# Patient Record
Sex: Female | Born: 1940 | Race: White | Hispanic: No | Smoking: Never smoker
Health system: Southern US, Community
[De-identification: ages and names within clinical notes are randomized; demographics above are authoritative.]

## PROBLEM LIST (undated history)

## (undated) DIAGNOSIS — R0789 Other chest pain: Secondary | ICD-10-CM

## (undated) DIAGNOSIS — L409 Psoriasis, unspecified: Secondary | ICD-10-CM

## (undated) DIAGNOSIS — E039 Hypothyroidism, unspecified: Secondary | ICD-10-CM

## (undated) DIAGNOSIS — IMO0002 Reserved for concepts with insufficient information to code with codable children: Secondary | ICD-10-CM

## (undated) DIAGNOSIS — I313 Pericardial effusion (noninflammatory): Secondary | ICD-10-CM

## (undated) DIAGNOSIS — E119 Type 2 diabetes mellitus without complications: Secondary | ICD-10-CM

## (undated) DIAGNOSIS — I3139 Other pericardial effusion (noninflammatory): Secondary | ICD-10-CM

## (undated) DIAGNOSIS — I10 Essential (primary) hypertension: Secondary | ICD-10-CM

## (undated) DIAGNOSIS — R9430 Abnormal result of cardiovascular function study, unspecified: Secondary | ICD-10-CM

## (undated) DIAGNOSIS — E669 Obesity, unspecified: Secondary | ICD-10-CM

## (undated) HISTORY — DX: Reserved for concepts with insufficient information to code with codable children: IMO0002

## (undated) HISTORY — DX: Obesity, unspecified: E66.9

## (undated) HISTORY — DX: Other chest pain: R07.89

## (undated) HISTORY — DX: Psoriasis, unspecified: L40.9

## (undated) HISTORY — DX: Hypothyroidism, unspecified: E03.9

## (undated) HISTORY — DX: Other pericardial effusion (noninflammatory): I31.39

## (undated) HISTORY — DX: Essential (primary) hypertension: I10

## (undated) HISTORY — PX: TUBAL LIGATION: SHX77

## (undated) HISTORY — DX: Type 2 diabetes mellitus without complications: E11.9

## (undated) HISTORY — PX: OTHER SURGICAL HISTORY: SHX169

## (undated) HISTORY — DX: Pericardial effusion (noninflammatory): I31.3

## (undated) HISTORY — DX: Abnormal result of cardiovascular function study, unspecified: R94.30

---

## 1999-05-30 ENCOUNTER — Other Ambulatory Visit: Admission: RE | Admit: 1999-05-30 | Discharge: 1999-05-30 | Payer: Self-pay | Admitting: *Deleted

## 2000-06-26 ENCOUNTER — Emergency Department (HOSPITAL_COMMUNITY): Admission: EM | Admit: 2000-06-26 | Discharge: 2000-06-26 | Payer: Self-pay | Admitting: Emergency Medicine

## 2000-07-05 ENCOUNTER — Other Ambulatory Visit: Admission: RE | Admit: 2000-07-05 | Discharge: 2000-07-05 | Payer: Self-pay | Admitting: *Deleted

## 2001-04-03 ENCOUNTER — Encounter: Admission: RE | Admit: 2001-04-03 | Discharge: 2001-04-03 | Payer: Self-pay | Admitting: Family Medicine

## 2001-04-03 ENCOUNTER — Encounter: Payer: Self-pay | Admitting: Family Medicine

## 2001-08-05 ENCOUNTER — Other Ambulatory Visit: Admission: RE | Admit: 2001-08-05 | Discharge: 2001-08-05 | Payer: Self-pay | Admitting: *Deleted

## 2004-04-26 ENCOUNTER — Ambulatory Visit (HOSPITAL_COMMUNITY): Admission: RE | Admit: 2004-04-26 | Discharge: 2004-04-26 | Payer: Self-pay | Admitting: Family Medicine

## 2004-04-26 ENCOUNTER — Encounter: Payer: Self-pay | Admitting: Cardiology

## 2004-04-26 ENCOUNTER — Ambulatory Visit: Payer: Self-pay | Admitting: Cardiology

## 2004-05-17 ENCOUNTER — Ambulatory Visit: Payer: Self-pay | Admitting: Cardiology

## 2004-06-07 ENCOUNTER — Ambulatory Visit: Payer: Self-pay

## 2004-06-21 ENCOUNTER — Ambulatory Visit: Payer: Self-pay | Admitting: Cardiology

## 2005-06-25 ENCOUNTER — Ambulatory Visit: Payer: Self-pay | Admitting: Cardiology

## 2005-07-12 ENCOUNTER — Encounter: Payer: Self-pay | Admitting: Cardiology

## 2005-07-12 ENCOUNTER — Ambulatory Visit: Payer: Self-pay

## 2005-07-27 ENCOUNTER — Ambulatory Visit: Payer: Self-pay | Admitting: Cardiology

## 2005-08-28 ENCOUNTER — Ambulatory Visit: Payer: Self-pay | Admitting: Cardiology

## 2006-07-16 ENCOUNTER — Encounter: Admission: RE | Admit: 2006-07-16 | Discharge: 2006-07-16 | Payer: Self-pay | Admitting: Internal Medicine

## 2006-08-19 ENCOUNTER — Ambulatory Visit: Payer: Self-pay | Admitting: Cardiology

## 2006-12-26 ENCOUNTER — Ambulatory Visit: Payer: Self-pay | Admitting: Cardiology

## 2006-12-27 ENCOUNTER — Ambulatory Visit: Payer: Self-pay

## 2006-12-27 ENCOUNTER — Encounter: Payer: Self-pay | Admitting: Endocrinology

## 2007-02-06 ENCOUNTER — Ambulatory Visit: Payer: Self-pay | Admitting: Cardiology

## 2007-10-20 ENCOUNTER — Ambulatory Visit: Payer: Self-pay | Admitting: Internal Medicine

## 2007-12-18 ENCOUNTER — Ambulatory Visit: Payer: Self-pay | Admitting: Internal Medicine

## 2008-02-25 ENCOUNTER — Ambulatory Visit: Payer: Self-pay | Admitting: Cardiology

## 2008-03-01 ENCOUNTER — Encounter: Payer: Self-pay | Admitting: Cardiology

## 2008-03-01 ENCOUNTER — Ambulatory Visit: Payer: Self-pay

## 2008-03-02 ENCOUNTER — Ambulatory Visit: Payer: Self-pay | Admitting: Internal Medicine

## 2008-03-02 ENCOUNTER — Other Ambulatory Visit: Admission: RE | Admit: 2008-03-02 | Discharge: 2008-03-02 | Payer: Self-pay | Admitting: Internal Medicine

## 2008-03-02 ENCOUNTER — Encounter: Payer: Self-pay | Admitting: Endocrinology

## 2008-03-02 LAB — HM PAP SMEAR

## 2008-03-24 ENCOUNTER — Encounter: Admission: RE | Admit: 2008-03-24 | Discharge: 2008-03-24 | Payer: Self-pay | Admitting: Internal Medicine

## 2008-04-08 ENCOUNTER — Ambulatory Visit: Payer: Self-pay | Admitting: Internal Medicine

## 2008-04-27 ENCOUNTER — Ambulatory Visit: Payer: Self-pay | Admitting: Endocrinology

## 2008-04-27 DIAGNOSIS — I10 Essential (primary) hypertension: Secondary | ICD-10-CM | POA: Insufficient documentation

## 2008-04-27 DIAGNOSIS — M129 Arthropathy, unspecified: Secondary | ICD-10-CM | POA: Insufficient documentation

## 2008-05-19 ENCOUNTER — Encounter: Admission: RE | Admit: 2008-05-19 | Discharge: 2008-05-19 | Payer: Self-pay | Admitting: Internal Medicine

## 2008-06-01 ENCOUNTER — Ambulatory Visit: Payer: Self-pay | Admitting: Internal Medicine

## 2008-07-13 ENCOUNTER — Ambulatory Visit: Payer: Self-pay | Admitting: Internal Medicine

## 2008-09-30 ENCOUNTER — Ambulatory Visit: Payer: Self-pay | Admitting: Internal Medicine

## 2008-12-06 ENCOUNTER — Ambulatory Visit: Payer: Self-pay | Admitting: Internal Medicine

## 2009-01-13 ENCOUNTER — Encounter (INDEPENDENT_AMBULATORY_CARE_PROVIDER_SITE_OTHER): Payer: Self-pay | Admitting: *Deleted

## 2009-03-22 ENCOUNTER — Encounter: Payer: Self-pay | Admitting: Endocrinology

## 2009-05-24 ENCOUNTER — Ambulatory Visit: Payer: Self-pay | Admitting: Internal Medicine

## 2009-06-30 ENCOUNTER — Encounter: Payer: Self-pay | Admitting: Cardiology

## 2009-06-30 DIAGNOSIS — E039 Hypothyroidism, unspecified: Secondary | ICD-10-CM | POA: Insufficient documentation

## 2009-07-01 ENCOUNTER — Ambulatory Visit: Payer: Self-pay | Admitting: Cardiology

## 2009-07-01 DIAGNOSIS — R9431 Abnormal electrocardiogram [ECG] [EKG]: Secondary | ICD-10-CM

## 2009-11-10 LAB — HM MAMMOGRAPHY

## 2009-11-24 ENCOUNTER — Ambulatory Visit: Payer: Self-pay | Admitting: Internal Medicine

## 2009-12-02 ENCOUNTER — Ambulatory Visit: Payer: Self-pay | Admitting: Internal Medicine

## 2009-12-20 ENCOUNTER — Ambulatory Visit: Payer: Self-pay | Admitting: Internal Medicine

## 2010-02-12 LAB — CONVERTED CEMR LAB: Hgb A1c MFr Bld: 6.6 %

## 2010-02-16 NOTE — Miscellaneous (Signed)
  Clinical Lists Changes  Problems: Added new problem of * PERICARDIAL EFFUSION Added new problem of CHEST DISCOMFORT (ICD-786.59) Added new problem of HYPOTHYROIDISM (ICD-244.9) Observations: Added new observation of PAST MED HX: Diabetes mellitus, type II Hypertension pericardial effusion.. small... in the past...unexplained... normal sedimentation rate and ANA /  trivial... echo... February, 2011 Nuclear stress... December, 2008... normal Chest discomfort Hypothyroidism EF 65%.... echo.. March 01, 2008... trivial pericardial effusion...? increased LV filling pressure ? (06/30/2009 13:06) Added new observation of PRIMARY MD: Eden Emms Baxley (06/30/2009 13:06)       Past History:  Past Medical History: Diabetes mellitus, type II Hypertension pericardial effusion.. small... in the past...unexplained... normal sedimentation rate and ANA /  trivial... echo... February, 2011 Nuclear stress... December, 2008... normal Chest discomfort Hypothyroidism EF 65%.... echo.. March 01, 2008... trivial pericardial effusion...? increased LV filling pressure ?

## 2010-02-16 NOTE — Miscellaneous (Signed)
Summary: A1C   Clinical Lists Changes  Observations: Added new observation of HGBA1C: 6.6 % (03/02/2008 16:13)      -  Date:  03/02/2008    HbA1c: 6.6 Juel Burrow, MD

## 2010-02-16 NOTE — Assessment & Plan Note (Signed)
Summary: f1y per pt call/lg   Visit Type:  Follow-up Primary Provider:  Eden Emms Baxley  CC:  pericardial effusion.  History of Present Illness: The patient is seen for cardiology followup.  I saw her last in February, 2010.  He's not had any chest pain.  Since her last visit she has been noted to have diabetes and is being treated.  She is not having any significant symptoms.  Historically she had a small pericardial effusion in the past.  She had a normal sedimentation rate and ANA.  She had a followup echo in February, 2010.  She had persistent trivial pericardial effusion.  Ejection fraction was 65%.  Current Medications (verified): 1)  Synthroid 75 Mcg Tabs (Levothyroxine Sodium) .Marland Kitchen.. 1 Tab Qam 2)  Lisinopril 10 Mg Tabs (Lisinopril) .Marland Kitchen.. 1 Tab Qam 3)  Hydrochlorothiazide 25 Mg Tabs (Hydrochlorothiazide) .... Take One Tablet By Mouth Daily. 4)  Fish Oil  Oil (Fish Oil) .... Take 3 By Mouth Once Daily 5)  Metformin Hcl 500 Mg Tabs (Metformin Hcl) .... Take 1 By Mouth Once Daily 6)  Glucosamine-Chondroitin  Caps (Glucosamine-Chondroit-Vit C-Mn) .... Take 3 By Mouth Once Daily  Allergies: 1)  ! Celebrex  Past History:  Past Surgical History: Last updated: 06/30/2009  Tubal ligation, left arthroscopic knee surgery.      Family History: Last updated: 06/30/2009 2 sibs have dm  Non-contributory for early coronary disease, but is   positive for cancer.      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 She rarely drinks alcohol.   Risk Factors: Alcohol Use: 0 (04/27/2008)  Past Medical History: Diabetes mellitus, type II Hypertension pericardial effusion.. small... in the past...unexplained... normal sedimentation rate and ANA /  trivial... echo... February, 2011 Nuclear stress... December, 2008... normal Chest discomfort Hypothyroidism EF 65%.... echo.. March 01, 2008... trivial  pericardial effusion...? increased LV filling pressure ? Obesity  Review of Systems       Patient denies fever, chills, headache, sweats, rash, change in vision, change in hearing, chest pain, cough, nausea vomiting, urinary symptoms.  All of the systems are reviewed and are negative.  Vital Signs:  Patient profile:   70 year old female Height:      65.5 inches Weight:      181 pounds BMI:     29.77 Pulse rate:   75 / minute Pulse rhythm:   regular BP sitting:   134 / 66  (left arm) Cuff size:   regular  Vitals Entered By: Stanton Kidney, EMT-P (July 01, 2009 11:19 AM)  Physical Exam  General:  patient is stable. Head:  head is atraumatic. Eyes:  no xanthelasma. Neck:  no jugular venous extension. Chest Wall:  no chest wall tenderness. Lungs:  lungs are clear.  Respiratory effort is nonlabored. Heart:  cardiac exam reveals S1-S2.  No clicks or significant murmurs. Abdomen:  abdomen is soft. Msk:  no musculoskeletal deformities. Extremities:  no peripheral edema. Skin:  no skin rashes. Psych:  patient is oriented to person time and place.  Affect is normal.   Impression & Recommendations:  Problem # 1:  HYPOTHYROIDISM (ICD-244.9)  Her updated medication list for this problem includes:    Synthroid 75 Mcg Tabs (Levothyroxine sodium) .Marland Kitchen... 1 tab qam Hypothyroidism is treated.  Problem # 2:  CHEST DISCOMFORT (ICD-786.59)  The patient has not had any significant recurrent chest pain.  EKG is done today and it is  reviewed by me..  I have compared to the tracing of February, 2010.  There is no significant change.  No further workup.  Orders: EKG w/ Interpretation (93000)  Problem # 3:  * PERICARDIAL EFFUSION The patient had a trivial pericardial effusion in February, 2010.  She does not need a followup echo this year.  Problem # 4:  HYPERTENSION (ICD-401.9)  Her updated medication list for this problem includes:    Lisinopril 10 Mg Tabs (Lisinopril) .Marland Kitchen... 1 tab qam     Hydrochlorothiazide 25 Mg Tabs (Hydrochlorothiazide) .Marland Kitchen... Take one tablet by mouth daily. Blood pressure is well controlled.  No change in therapy.  Problem # 5:  ELECTROCARDIOGRAM, ABNORMAL (ICD-794.31) EKG today is unchanged from February 2 010.  At the time of the EKG in 2010, her left ventricular function was normal.  Right ventricular function and size were normal also.  No further workup is needed.  Plan follow up in one year.  I reviewed the complete echo report from last year.  Patient Instructions: 1)  Your physician wants you to follow-up in:  1 year.  You will receive a reminder letter in the mail two months in advance. If you don't receive a letter, please call our office to schedule the follow-up appointment.

## 2010-05-30 ENCOUNTER — Other Ambulatory Visit: Payer: Medicare Other

## 2010-05-30 NOTE — Assessment & Plan Note (Signed)
[facility]                            CARDIOLOGY OFFICE NOTE   Sharon Peters, Sharon Peters                    MRN:          [id number]  DATE:12/26/2006                            DOB:          10-Jul-1940    PRIMARY CARE PHYSICIAN:  Dr. Sharlet Salina.   REASON FOR PRESENTATION:  Evaluate patient with chest pain.   HISTORY OF PRESENT ILLNESS:  The patient is 70 years old. She was seen  in the past by Dr. 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 This was small and follow up was not required. However, she  walked in today with chest discomfort. She was at work at the health  department. It was quite stressful. She developed some heartburn. It  was a chest discomfort. However, what bothered her most was that her  left arm became heavy. She had some tingling in her left arm. The  discomfort in her chest has slowly resolved, though her left arm is a  little bit heavy still. She did not have these symptoms before. She had  not been having any complaints prior to this, though she does not  exercise routinely. She does do some housework. With this level of  activity, she denies any exercise-induced chest discomfort, neck or arm  discomfort. Today, she is not having any shortness of breath. She denies  any PND or orthopnea. She is not having any diaphoresis or nausea. She  has had no palpitations, pre-syncope, or syncope. Of note, the only  activity that she thinks causes some dyspnea is making the bed, but this  has been a chronic problem.   PAST MEDICAL HISTORY:  Hypertension x years, small pericardial effusion,  hypothyroidism. She has no history of hyperlipidemia or diabetes.   PAST SURGICAL HISTORY:  Tubal ligation, left arthroscopic knee surgery.   ALLERGIES:  CELEBREX.   MEDICATIONS:  1. Synthroid 0.75 mg daily.  2. Glucosamine.  3. Hydrochlorothiazide 25 mg daily.  4. Lisinopril 10 mg daily.  5. Lexapro 5 mg daily.  6.  Meloxicam.   SOCIAL HISTORY:  The patient works at the health department. She has  never smoked cigarettes. She rarely drinks alcohol.   FAMILY HISTORY:  Non-contributory for early coronary disease, but is  positive for cancer.   REVIEW OF SYSTEMS:  As stated in the HPI and otherwise negative for  other systems.   PHYSICAL EXAMINATION:  GENERAL:  The patient is in no distress.  VITAL SIGNS:  Blood pressure 156/80, heart rate 70 and regular, weight  197 pounds.  HEENT:  Eyelids unremarkable, pupils equal, round, and reactive to  light, fundi within normal limits, oral mucosa unremarkable.  NECK:  No jugular vein distension at 45 degrees, carotid upstroke brisk  and symmetric, no bruits, no thyromegaly.  LYMPHATICS:  No cervical, axillary, or inguinal adenopathy.  LUNGS:  Clear to auscultation bilaterally.  BACK:  No costovertebral angle tenderness.  CHEST:  Unremarkable.  HEART:  PMI not displaced or sustained, S1 and S2 within normal limits,  no S3, no S4, no clicks, rubs, or murmurs.  ABDOMEN:  Obese, positive bowel  sounds, normal to frequency and pitch,  no bruits, no rebound, no guarding, no midline pulsatile mass, no  hepatomegaly, no splenomegaly.  SKIN:  No rashes, no nodules.  EXTREMITIES:  2+ pulses throughout, no cyanosis, clubbing, or edema.  NEUROLOGIC:  Oriented to person, place, and time. Cranial nerves II-XII  are grossly intact, motor grossly intact.   EKG sinus rhythm, rate 70, axis within normal limits, intervals within  normal limits, no acute ST-T wave changes.   ASSESSMENT AND PLAN:  1. Chest and arm discomfort. The patient does have these symptoms.      There are some typical and some otherwise atypical symptoms. She      does have hypertension as a cardiovascular risk factor. She is 70      years old. I think that the pre-test probability of obstructive      coronary disease is moderate to moderately low. I think screening      with an exercise perfusion  study is indicated. Further evaluation      will be based on these results.  2. Hypertension. She says that her blood pressure is usually well      controlled on the medications listed. She will continue to monitor      this. She can actually get it checked at work.  3. Obesity. We discussed weight loss with diet and exercise to include      [facility] Diet.  4. Hypothyroidism. She has thyroid replacement and this is followed by      Dr. Lenord Fellers.  5. Follow up. Based on the results of the above.     Rollene Rotunda, MD, [facility]  Electronically Signed    JH/MedQ  DD: 12/26/2006  DT: 12/27/2006  Job #: [id number]   cc:   Luanna Cole. Lenord Fellers, M.D.

## 2010-05-30 NOTE — Assessment & Plan Note (Signed)
[facility]                            CARDIOLOGY OFFICE NOTE   MEDINA, DEGRAFFENREID                    MRN:          [id number]  DATE:02/25/2008                            DOB:          05/17/1940    Sharon Peters is doing well.  She had a very small pericardial effusion  in the past.  She has had some vague chest pain.  She had a Myoview in  December 2008.  There was no ischemia.  She is fully active.  She does  get a slight dull aching sensation in her chest at times.  It is more  positional.   PAST MEDICAL HISTORY:   ALLERGIES:  Eye swelling from CELEBREX.   MEDICATIONS:  Synthroid, glucosamine, hydrochlorothiazide, lisinopril,  Lexapro, fish oil, and vitamins.   OTHER MEDICAL PROBLEMS:  See the list below.   REVIEW OF SYSTEMS:  She is not having any GI or GU complaints.  She has  no fevers or chills.  There are no skin rashes.  She has no eye problems  or headaches.  There are no dental problems.  Otherwise, review of  systems is negative.   PHYSICAL EXAMINATION:  VITAL SIGNS:  Blood pressure is 134/70 with a  pulse of 76.  GENERAL:  The patient is oriented to person, time, and place.  Affect is  normal.  HEENT:  No xanthelasma.  She has normal extraocular motion.  NECK:  There are no carotid bruits.  There is no jugular venous  distention.  LUNGS:  Clear.  Respiratory effort is not labored.  CARDIAC:  S1 and S2.  There are no clicks or significant murmurs.  ABDOMEN:  Soft.  EXTREMITIES:  There is no peripheral edema.   EKG reveals sinus rhythm.  There is slight rightward axis.  There are  nonspecific ST-T wave changes.   PROBLEMS:  1. Some vague chest discomfort.  I do not believe that this is      ischemic.  2. History of hypertension, treated.  3. Obesity.  She is working on losing weight.  4. Hypothyroidism, on medications.  5. History of a small unexplained pericardial effusion with a normal      sed rate and a normal  ANA.   Overall, I believe, Sharon Peters is stable.  Cause of the slight  question of right axis on her EKG and her prior history of pericardial  effusion and feeling of fatigue, I feel that it would be helpful to  do a followup 2-D echo to be sure that there not been any significant  changes.  This will be done.  I will then be touch with her.  Otherwise,  I will see her back in 1 year.     Luis Abed, MD, [facility]  Electronically Signed    JDK/MedQ  DD: 02/25/2008  DT: 02/25/2008  Job #: [id number]   cc:   Luanna Cole. Lenord Fellers, M.D.

## 2010-05-30 NOTE — Assessment & Plan Note (Signed)
[facility]                            CARDIOLOGY OFFICE NOTE   PAULINA, MUCHMORE                    MRN:          [id number]  DATE:08/19/2006                            DOB:          [birth year]    Ms. Stogsdill is doing well. In the past she had a very small pericardial  effusion. She also had hypertension. This is now nicely under control.  She has had some rare nighttime leg cramps. There has been no exertional  symptoms. She is not having and significant chest pain.   PAST MEDICAL HISTORY:   ALLERGIES:  CELEBREX.   MEDICATIONS:  Synthroid, glucosamine, hydrochlorothiazide, lisinopril,  and Lexapro.   OTHER MEDICAL PROBLEMS:  See the list below.   REVIEW OF SYSTEMS:  Other than some mild fatigue she is actually doing  well and her review of systems is negative.   PHYSICAL EXAMINATION:  Weight is 191 pounds, with blood pressure 132/72,  and a pulse of 68. Patient is oriented to person, time, and place.  Affect is normal.  HEENT: Reveals no xanthelasma. She has normal extraocular motion. There  are no carotid bruits. There is no jugular venous distension.  LUNGS: Clear. Respiratory effort is not labored.  CARDIAC EXAM: Reveals an S1 with an S2. She has no clicks or significant  murmurs.  ABDOMEN: Soft. She has normal bowel sounds.  There is no significant peripheral edema.   EKG: Reveals sinus rhythm with nonspecific ST-T wave abnormalities that  are unchanged from the past.   PROBLEMS:  Include:  1. Mild generalized fatigue that is old.  2. Hypertension, her blood pressure is nicely treated at this time.  3. History in the past of mild palpitations which is not a significant      ongoing issue.  4. Hypothyroidism, treated.  5. History of an unexplained small pericardial effusion in the past.      She had a normal sed rate and a normal ANA. There was question of      slight elevation of her rheumatoid factor. Her last  echocardiogram      was in January of 2007 with a very mild effusion. I feel that      follow up echo is not needed at this time.   Her overall cardiac status is stable. No further cardiac testing is  needed. She will follow with Dr. Lenord Fellers. I will be happy to see her at  anytime in the future if other cardiac questions arise.     Luis Abed, MD, [facility]  Electronically Signed    JDK/MedQ  DD: 08/19/2006  DT: 08/19/2006  Job #: [id number]   cc:   Luanna Cole. Lenord Fellers, M.D.

## 2010-05-30 NOTE — Assessment & Plan Note (Signed)
[facility]                            CARDIOLOGY OFFICE NOTE   ETNA, FORQUER                    MRN:          [id number]  DATE:02/06/2007                            DOB:          [birth year]    Ms. Boies is here for follow-up.  I had seen her in the past.  She  was seen by Dr. Antoine Poche as an add-on on December 26, 2006.  There was  some concern about some chest and arm discomfort.  She seemed stable.  She underwent a Myoview scan on December 27, 2006.  The study revealed a  normal ejection fraction and no sign of ischemia.  She returns today and  she is feeling well.   She is not having any other significant recurrent chest discomfort and  she is stable.  She is under a great deal of stress at work.   PAST MEDICAL HISTORY:  ALLERGIES:  CELEBREX.   MEDICATIONS:  Synthroid, glucosamine, hydrochlorothiazide, lisinopril,  Lexapro and an anti-inflammatory medication for her hands.   OTHER MEDICAL PROBLEMS:  See the list below.   REVIEW OF SYSTEMS:  She is stable today.  She has asked me about a  slight fullness in the left anterior neck adjust above the left  clavicle.  See the physical exam below.  Otherwise the review of systems  is negative.   PHYSICAL EXAM:  Blood pressure 129/81 with a pulse of 76.  The patient  is oriented to person, time and place.  Affect is normal.  Weight is 198  pounds on our scale today.  HEENT:  Reveals no xanthelasma.  She has normal extraocular motion.  There are no carotid bruits.  There is no jugular venous distention.  There may be a very slight fullness in the left supraclavicular area.  I  do not feel any definite significant nodes in this area.  LUNGS:  Clear.  Respiratory effort is not labored.  CARDIAC:  Exam reveals an S1 with an S2.  There are no clicks or  significant murmurs.  ABDOMEN: Soft.  She has no peripheral edema.   No other labs are done at this time.   PROBLEMS:  1. Some recent  chest discomfort and arm discomfort.  This does not      appear to be cardiac.  She is stable.  No further workup.  2. Hypertension.  Blood pressure is well-controlled today.  3. Obesity.  She is working on losing weight.  4. Hypothyroidism on medication.  5. History of an unexplained small pericardial effusion in the past      with a normal sed rate and a normal ANA.  She has been stable.      This does not need any further workup at this time.   The patient is stable.  She will follow-up with Dr. Lenord Fellers.     Luis Abed, MD, [facility]  Electronically Signed    JDK/MedQ  DD: 02/06/2007  DT: 02/06/2007  Job #: [id number]   cc:   Luanna Cole. Lenord Fellers, M.D.

## 2010-06-02 NOTE — Assessment & Plan Note (Signed)
[facility]                              CARDIOLOGY OFFICE NOTE   OLAR, SANTINI                    MRN:          [id number]  DATE:08/28/2005                            DOB:          1940-05-12    Ms. Sharon Peters is seen for cardiology followup.  See my note of July 27, 2005.  She had continued elevation of her blood pressure.  We added lisinopril.  She has had a very nice response.  She has had some headaches.  This may be  related to work.  Also, she questions whether there is a slight change in  taste of food.  I cannot be sure that any of these are related to her  medicines, but she will follow this.   She is stable.  I will be happy to follow her in any way.  She will be  following up with primary care, and we will help as needed.  I will see her  back in one year if it seems appropriate at that time.                               Luis Abed, MD, [facility]    JDK/MedQ  DD:  08/28/2005  DT:  08/28/2005  Job #:  [id number]   cc:   Billie Lade, MD

## 2010-06-02 NOTE — Assessment & Plan Note (Signed)
[facility]                              CARDIOLOGY OFFICE NOTE   Sharon, Peters                    MRN:          [id number]  DATE:07/27/2005                            DOB:          1940-07-17    HISTORY OF PRESENT ILLNESS:  Sharon Peters is seen for cardiology follow  up.  She was a patient of Dr. Weyman Croon Wilson's and will now be followed by Dr.  Artis Flock.  Her blood pressure remains elevated and we are adjusting her  medicines further.  She has a history of a small unexplained pericardial  effusion in the past.  A followup 2D echo was done dated July 12, 2005.  She  has excellent left ventricular function.  She has a trivial posterior  pericardial effusion.  This is of no significant clinical concern.   PAST MEDICAL HISTORY:   ALLERGIES:  CELEBREX   MEDICATIONS:  Synthroid 0.075, glucosamine, hydrochlorothiazide 25 and today  lisinopril is added at 10 mg daily.   OTHER MEDICAL PROBLEMS:  See the complete list on my note of June 25, 2005.   REVIEW OF SYSTEMS:  The patient is feeling well and her review of systems is  negative.   PHYSICAL EXAMINATION:  VITAL SIGNS:  Blood pressure today is 168/84 with a  pulse of 85.  GENERAL:  The patient is oriented to person, time and place and her affect  is normal.  LUNGS:  Clear.  Respiratory effort is not labored.  HEENT:  Reveals no xanthelasma.  She has normal extraocular motion.  NECK:  There are no carotid bruits.  There is no jugular venous distention.  CARDIAC:  Reveals an S1 with an S2.  There are no clicks or significant  murmurs.  ABDOMEN:  Soft.  She has no significant peripheral edema.   PROBLEMS:  1.  The problems as listed numbers 1-5 on my note of June 25, 2005.  2.  Hypertension.  3.  We talked about salt and fluid intake and she will continue      hydrochlorothiazide.  I am adding an ACE inhibitor today. We will see      her for follow up.  4.  Unexplained pericardial  effusion in the past.  This is not a significant      ongoing problem.   I will see her for follow up.                               Luis Abed, MD, [facility]    JDK/MedQ  DD:  07/27/2005  DT:  07/27/2005  Job #:  [id number]   cc:   Quita Skye. Artis Flock, MD

## 2010-06-14 ENCOUNTER — Other Ambulatory Visit: Payer: Self-pay | Admitting: Internal Medicine

## 2010-07-03 ENCOUNTER — Encounter: Payer: Self-pay | Admitting: Cardiology

## 2010-07-07 ENCOUNTER — Other Ambulatory Visit: Payer: 59 | Admitting: Internal Medicine

## 2010-07-10 ENCOUNTER — Encounter: Payer: Medicare Other | Admitting: Internal Medicine

## 2010-08-09 ENCOUNTER — Encounter: Payer: Self-pay | Admitting: Internal Medicine

## 2010-08-11 ENCOUNTER — Other Ambulatory Visit: Payer: Medicare Other | Admitting: Internal Medicine

## 2010-08-11 ENCOUNTER — Other Ambulatory Visit: Payer: Self-pay | Admitting: Internal Medicine

## 2010-08-11 DIAGNOSIS — E559 Vitamin D deficiency, unspecified: Secondary | ICD-10-CM

## 2010-08-11 DIAGNOSIS — E7800 Pure hypercholesterolemia, unspecified: Secondary | ICD-10-CM

## 2010-08-11 DIAGNOSIS — E039 Hypothyroidism, unspecified: Secondary | ICD-10-CM

## 2010-08-11 DIAGNOSIS — Z0000 Encounter for general adult medical examination without abnormal findings: Secondary | ICD-10-CM

## 2010-08-11 DIAGNOSIS — I10 Essential (primary) hypertension: Secondary | ICD-10-CM

## 2010-08-11 LAB — COMPREHENSIVE METABOLIC PANEL
ALT: 15 U/L (ref 0–35)
Albumin: 4.6 g/dL (ref 3.5–5.2)
Alkaline Phosphatase: 54 U/L (ref 39–117)
CO2: 23 mEq/L (ref 19–32)
Potassium: 4.3 mEq/L (ref 3.5–5.3)
Sodium: 134 mEq/L — ABNORMAL LOW (ref 135–145)
Total Bilirubin: 0.8 mg/dL (ref 0.3–1.2)
Total Protein: 6.9 g/dL (ref 6.0–8.3)

## 2010-08-11 LAB — LIPID PANEL
LDL Cholesterol: 90 mg/dL (ref 0–99)
VLDL: 19 mg/dL (ref 0–40)

## 2010-08-12 LAB — CBC WITH DIFFERENTIAL/PLATELET
Basophils Relative: 1 % (ref 0–1)
Eosinophils Absolute: 0.1 10*3/uL (ref 0.0–0.7)
Hemoglobin: 11.9 g/dL — ABNORMAL LOW (ref 12.0–15.0)
Lymphs Abs: 1.9 10*3/uL (ref 0.7–4.0)
MCH: 28.6 pg (ref 26.0–34.0)
Monocytes Relative: 11 % (ref 3–12)
Neutro Abs: 3.1 10*3/uL (ref 1.7–7.7)
Neutrophils Relative %: 53 % (ref 43–77)
Platelets: 196 10*3/uL (ref 150–400)
RBC: 4.16 MIL/uL (ref 3.87–5.11)

## 2010-08-12 LAB — TSH: TSH: 2.008 u[IU]/mL (ref 0.350–4.500)

## 2010-08-14 ENCOUNTER — Encounter: Payer: Self-pay | Admitting: Internal Medicine

## 2010-08-14 ENCOUNTER — Ambulatory Visit (INDEPENDENT_AMBULATORY_CARE_PROVIDER_SITE_OTHER): Payer: Medicare Other | Admitting: Internal Medicine

## 2010-08-14 ENCOUNTER — Encounter: Payer: Self-pay | Admitting: Cardiology

## 2010-08-14 VITALS — BP 134/72 | HR 82 | Temp 97.4°F | Ht 65.0 in | Wt 178.0 lb

## 2010-08-14 DIAGNOSIS — F411 Generalized anxiety disorder: Secondary | ICD-10-CM

## 2010-08-14 DIAGNOSIS — E119 Type 2 diabetes mellitus without complications: Secondary | ICD-10-CM

## 2010-08-14 DIAGNOSIS — I10 Essential (primary) hypertension: Secondary | ICD-10-CM

## 2010-08-14 DIAGNOSIS — I5189 Other ill-defined heart diseases: Secondary | ICD-10-CM

## 2010-08-14 DIAGNOSIS — F419 Anxiety disorder, unspecified: Secondary | ICD-10-CM

## 2010-08-14 DIAGNOSIS — G4700 Insomnia, unspecified: Secondary | ICD-10-CM

## 2010-08-14 DIAGNOSIS — I519 Heart disease, unspecified: Secondary | ICD-10-CM

## 2010-08-14 DIAGNOSIS — E039 Hypothyroidism, unspecified: Secondary | ICD-10-CM

## 2010-08-14 DIAGNOSIS — M1990 Unspecified osteoarthritis, unspecified site: Secondary | ICD-10-CM

## 2010-08-14 DIAGNOSIS — Z0000 Encounter for general adult medical examination without abnormal findings: Secondary | ICD-10-CM

## 2010-08-14 LAB — POCT URINALYSIS DIPSTICK
Bilirubin, UA: NEGATIVE
Blood, UA: NEGATIVE
Glucose, UA: NEGATIVE
Ketones, UA: NEGATIVE
Spec Grav, UA: 1.025

## 2010-08-14 LAB — HEMOGLOBIN A1C
Hgb A1c MFr Bld: 6.3 % — ABNORMAL HIGH (ref <5.7)
Mean Plasma Glucose: 134 mg/dL — ABNORMAL HIGH (ref <117)

## 2010-08-14 NOTE — Progress Notes (Signed)
Subjective:    Patient ID: Sharon Peters, female    DOB: 10-Oct-1940, 70 y.o.   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 History of hypertension, history of grade 1 diastolic dysfunction, osteoarthritis of hands, hypothyroidism, anxiety depression, adult onset diabetes mellitus. 1 she retired from her job she was able to discontinue Lexapro. She is on levothyroxin 0.075 mg daily for hypothyroidism which was diagnosed in 2000. In February 2010, she was diagnosed with diabetes mellitus. This is treated with metformin 500 mg daily. She takes HCTZ and lisinopril 10 mg daily for hypertension and to treat diastolic dysfunction. Had an eye exam via optometry is November 2011. Had mammogram October 2011. Zostavax vaccine November 2011. Pneumovax immunization November 2011. Influenza immunization November 2011. Patient is intolerant of Celebrex it causes her eyes to swell. Had left knee arthroscopic surgery around 1987 and a tubal ligation around 1979. Saw orthopedist about her left knee February 2012 and was diagnosed with a probable small meniscal tear which was injected with steroids. Also was diagnosed with a left hip greater trochanteric bursitis.  Patient is divorced and lives alone. Does not smoke. Rare alcohol consumption. 2 adult children a son and a daughter.  Family history: Father died at age 74 of pancreatic cancer, mother died at age 44 of lung cancer. 3 brothers sister Review of Systems  Constitutional: Negative.   HENT: Negative.   Eyes: Negative.   Respiratory: Negative.   Cardiovascular: Negative.   Gastrointestinal: Negative.   Genitourinary: Negative.   Musculoskeletal:       Knee pain  Skin: Negative.   Neurological: Negative.   Hematological: Negative.   Psychiatric/Behavioral: Negative.          Objective:   Physical Exam  Vitals reviewed. Constitutional: She is oriented to person, place, and time. She appears well-nourished. No distress.  HENT:  Head: Normocephalic and atraumatic.  Right Ear: External ear normal.  Left Ear: External ear normal.  Mouth/Throat: No oropharyngeal exudate.  Eyes: EOM are normal. Pupils are equal, round, and reactive to light. No scleral icterus.  Neck: Neck supple. No JVD present. No thyromegaly present.  Cardiovascular: Normal rate, regular rhythm and normal heart sounds.  Exam reveals no gallop.   Pulmonary/Chest: Effort normal and breath sounds normal. No respiratory distress. She has no wheezes. She has no rales. She exhibits no tenderness.       Breasts normal female  Abdominal: Soft. Bowel sounds are normal. She exhibits no distension and no mass. There is no tenderness. There is no rebound and no guarding.  Genitourinary:       Pap not taken- last Pap 2010. Bimanual exam normal  Musculoskeletal: She exhibits no edema.  Lymphadenopathy:    She has no cervical adenopathy.  Neurological: She is alert and oriented to person, place, and time. She has normal reflexes. No cranial nerve deficit. Coordination normal.  Skin: Skin is warm and dry. No rash noted.  Psychiatric: She has a normal mood and affect. Her behavior is normal. Judgment and thought content normal.          Assessment & Plan:   Hypertension  History of grade 1 diastolic dysfunction  Hypothyroidism  Adult onset diabetes mellitus  Osteoarthritis hands and knees  History of anxiety/depression  Refill Levothroid for for 6 months, HCTZ for one year, lisinopril  for 6 months, metformin for one year. Refill Ambien 10 mg (#30) 1 by mouth each bedtime when necessary sleep no refill

## 2010-08-15 LAB — MICROALBUMIN, URINE: Microalb, Ur: 0.5 mg/dL (ref 0.00–1.89)

## 2010-08-17 ENCOUNTER — Encounter: Payer: Self-pay | Admitting: Cardiology

## 2010-08-17 DIAGNOSIS — I313 Pericardial effusion (noninflammatory): Secondary | ICD-10-CM | POA: Insufficient documentation

## 2010-08-18 ENCOUNTER — Ambulatory Visit (INDEPENDENT_AMBULATORY_CARE_PROVIDER_SITE_OTHER): Payer: Medicare Other | Admitting: Cardiology

## 2010-08-18 ENCOUNTER — Encounter: Payer: Self-pay | Admitting: Cardiology

## 2010-08-18 DIAGNOSIS — R9431 Abnormal electrocardiogram [ECG] [EKG]: Secondary | ICD-10-CM

## 2010-08-18 DIAGNOSIS — I313 Pericardial effusion (noninflammatory): Secondary | ICD-10-CM

## 2010-08-18 DIAGNOSIS — R0789 Other chest pain: Secondary | ICD-10-CM

## 2010-08-18 DIAGNOSIS — I319 Disease of pericardium, unspecified: Secondary | ICD-10-CM

## 2010-08-18 NOTE — Assessment & Plan Note (Signed)
Patient is not having any significant symptoms.  No change in therapy.

## 2010-08-18 NOTE — Assessment & Plan Note (Signed)
Patient's EKG is unchanged.  No further workup is needed.

## 2010-08-18 NOTE — Assessment & Plan Note (Signed)
There is no reason to do a follow up echo at this time.  I'll see the patient back in 2 years.  We may reassess LV function at that time by echo.

## 2010-08-18 NOTE — Patient Instructions (Signed)
Your physician recommends that you schedule a follow-up appointment in: 2 years  

## 2010-08-18 NOTE — Progress Notes (Signed)
HPI Patient is seen for cardiology followup.  She is doing very well.  I've seen her in the past.  She has an abnormal EKG with no significant cardiac disease.  She had a pericardial effusion in the past.  Over time the followup showed the persistence of a trivial pericardial effusion.  Her  Ejection fraction historically is 65%.  She is fully active.  She's not having any significant symptoms Allergies  Allergen Reactions  . Celecoxib Swelling    Eyes swell shut    Current Outpatient Prescriptions  Medication Sig Dispense Refill  . b complex vitamins tablet Take 1 tablet by mouth daily.        . cholecalciferol (VITAMIN D) 1000 UNITS tablet Take 1,000 Units by mouth daily.        . fish oil-omega-3 fatty acids 1000 MG capsule Take by mouth daily. Take 3 by mouth once daily       . GLUCOSAMINE-CHONDROITIN PO Take by mouth. Take 3 by mouth once daily       . hydrochlorothiazide 25 MG tablet TAKE 1 TABLET BY MOUTH ONCE DAILY  90 tablet  PRN  . levothyroxine (SYNTHROID, LEVOTHROID) 75 MCG tablet Take 75 mcg by mouth daily after breakfast.        . lisinopril (PRINIVIL,ZESTRIL) 10 MG tablet take 1 tablet by mouth once daily  90 tablet  PRN  . metFORMIN (GLUMETZA) 500 MG (MOD) 24 hr tablet Take 500 mg by mouth daily with breakfast.        . zolpidem (AMBIEN) 10 MG tablet Take 10 mg by mouth at bedtime as needed.          History   Social History  . Marital Status: Divorced    Spouse Name: N/A    Number of Children: N/A  . Years of Education: N/A   Occupational History  . retired    Social History Main Topics  . Smoking status: Never Smoker   . Smokeless tobacco: Not on file  . Alcohol Use: Yes     rarely  . Drug Use: Not on file  . Sexually Active: Not on file   Other Topics Concern  . Not on file   Social History Narrative  . No narrative on file    Family History  Problem Relation Age of Onset  . Diabetes      2 siblings  . Cancer    . Cancer Mother   . Cancer Father      Past Medical History  Diagnosis Date  . Diabetes mellitus type II   . HTN (hypertension)   . Pericardial effusion     Trivial, echo, February, 2011  /     small...int he past...unexplained...normal sedimentation rate and ANA  . Chest discomfort     Nuclear, December, 2008, normal  . Hypothyroidism   . Obesity   . Ejection fraction     EF 65%, February, 2010, trivial pericardial effusion,    Past Surgical History  Procedure Date  . Tubal ligation   . Left arthroscopic knee surgery     ROS  Patient denies fever, chills, headache, sweats, rash, change in vision, change in hearing, chest pain, cough, nausea vomiting, urinary symptoms.  All other systems are reviewed and are negative.  PHYSICAL EXAM Patient is oriented to person time and place.  Affect is normal.  Head is atraumatic.  There is no xanthelasma.  Lungs are clear.  Respiratory effort is unlabored.  Cardiac exam reveals S1 and  S2.  No clicks or significant murmurs.  The abdomen is soft.  There is no peripheral edema. Filed Vitals:   08/18/10 1508  BP: 127/73  Pulse: 71  Height: 5' 5.5" (1.664 m)  Weight: 176 lb (79.833 kg)    EKG Is done today and reviewed by me.  Patient has large R wave in V1.  There is been questioned in the past with right axis and right ventricular hypertrophy.  EKG is unchanged.  Echo in the past have shown no abnormality.  ASSESSMENT & PLAN

## 2010-10-31 ENCOUNTER — Ambulatory Visit (INDEPENDENT_AMBULATORY_CARE_PROVIDER_SITE_OTHER): Payer: Medicare Other | Admitting: Internal Medicine

## 2010-10-31 DIAGNOSIS — Z23 Encounter for immunization: Secondary | ICD-10-CM

## 2011-02-16 ENCOUNTER — Other Ambulatory Visit: Payer: Medicare Other | Admitting: Internal Medicine

## 2011-02-19 ENCOUNTER — Ambulatory Visit: Payer: Medicare Other | Admitting: Internal Medicine

## 2011-02-22 ENCOUNTER — Other Ambulatory Visit: Payer: Medicare Other | Admitting: Internal Medicine

## 2011-03-22 ENCOUNTER — Other Ambulatory Visit: Payer: Medicare Other | Admitting: Internal Medicine

## 2011-03-22 DIAGNOSIS — E039 Hypothyroidism, unspecified: Secondary | ICD-10-CM

## 2011-03-22 DIAGNOSIS — E785 Hyperlipidemia, unspecified: Secondary | ICD-10-CM

## 2011-03-22 LAB — LIPID PANEL
HDL: 53 mg/dL (ref >39)
LDL Cholesterol: 86 mg/dL (ref 0–99)
Total CHOL/HDL Ratio: 3.2 Ratio

## 2011-03-22 LAB — TSH: TSH: 1.384 u[IU]/mL (ref 0.350–4.500)

## 2011-03-23 ENCOUNTER — Ambulatory Visit (INDEPENDENT_AMBULATORY_CARE_PROVIDER_SITE_OTHER): Payer: Medicare Other | Admitting: Internal Medicine

## 2011-03-23 ENCOUNTER — Encounter: Payer: Self-pay | Admitting: Internal Medicine

## 2011-03-23 DIAGNOSIS — I10 Essential (primary) hypertension: Secondary | ICD-10-CM

## 2011-03-23 DIAGNOSIS — E119 Type 2 diabetes mellitus without complications: Secondary | ICD-10-CM

## 2011-03-23 DIAGNOSIS — E039 Hypothyroidism, unspecified: Secondary | ICD-10-CM

## 2011-03-23 DIAGNOSIS — E785 Hyperlipidemia, unspecified: Secondary | ICD-10-CM

## 2011-03-23 DIAGNOSIS — I5189 Other ill-defined heart diseases: Secondary | ICD-10-CM

## 2011-03-23 DIAGNOSIS — E669 Obesity, unspecified: Secondary | ICD-10-CM

## 2011-03-23 DIAGNOSIS — I519 Heart disease, unspecified: Secondary | ICD-10-CM

## 2011-03-23 DIAGNOSIS — E1169 Type 2 diabetes mellitus with other specified complication: Secondary | ICD-10-CM

## 2011-03-23 LAB — HEMOGLOBIN A1C: Hgb A1c MFr Bld: 6.6 % — ABNORMAL HIGH (ref <5.7)

## 2011-03-23 NOTE — Progress Notes (Signed)
  Subjective:    Patient ID: Sharon Peters, female    DOB: 11/03/1940, 71 y.o.   MRN: [id number]  HPI 71 year old white female with history of hypertension, hyperlipidemia, grade 1 diastolic dysfunction, diabetes mellitus in today for six-month recheck. History of hypothyroidism. Patient's lipid panel not as good as it was 6 months ago. Patient admits to not dieting and exercising over the winter months. Says she will do better. Need to consider statin lowering medication his lipids do not improve in next 6 months. TSH is within normal limits. Blood pressure under good control. No other complaints or problems.  She is retired from the health department. Used to be on Lexapro for depression but was able to stop that after retirement.    Review of Systems     Objective:   Physical Exam chest clear to auscultation; cardiac exam regular rate and rhythm; extremities without edema. Diabetic foot exam negative for ulcers. Pulses are intact in feet.        Assessment & Plan:  Hypertension  Grade 1 diastolic dysfunction  Hypothyroidism  Hyperlipidemia  Adult onset diabetes mellitus. Patient is not a lipid-lowering therapy. Lipid panel may 2011 was within normal limits. Could be doing better with diet and exercise. TSH within normal limits.  Return in 6 months for physical examination and fasting labs.

## 2011-03-25 NOTE — Patient Instructions (Addendum)
Try the diet exercise and lose weight. Return in 6 months for physical exam and fasting lab work. Cholesterol panel is not as good as I would like to see it. We will recheck this in 6 months. Try to lose weight. Continue same medications.

## 2011-08-22 ENCOUNTER — Other Ambulatory Visit: Payer: Self-pay | Admitting: Internal Medicine

## 2011-09-18 ENCOUNTER — Encounter: Payer: Self-pay | Admitting: Internal Medicine

## 2011-09-18 ENCOUNTER — Ambulatory Visit (INDEPENDENT_AMBULATORY_CARE_PROVIDER_SITE_OTHER): Payer: Medicare Other | Admitting: Internal Medicine

## 2011-09-18 VITALS — BP 108/68 | HR 72 | Resp 12 | Ht 65.5 in | Wt 178.0 lb

## 2011-09-18 DIAGNOSIS — Z0000 Encounter for general adult medical examination without abnormal findings: Secondary | ICD-10-CM

## 2011-09-18 DIAGNOSIS — M810 Age-related osteoporosis without current pathological fracture: Secondary | ICD-10-CM

## 2011-09-18 DIAGNOSIS — E785 Hyperlipidemia, unspecified: Secondary | ICD-10-CM

## 2011-09-18 DIAGNOSIS — Z124 Encounter for screening for malignant neoplasm of cervix: Secondary | ICD-10-CM

## 2011-09-18 DIAGNOSIS — E039 Hypothyroidism, unspecified: Secondary | ICD-10-CM

## 2011-09-18 DIAGNOSIS — E119 Type 2 diabetes mellitus without complications: Secondary | ICD-10-CM

## 2011-09-18 DIAGNOSIS — I10 Essential (primary) hypertension: Secondary | ICD-10-CM

## 2011-09-18 LAB — LIPID PANEL
HDL: 54 mg/dL (ref >39)
LDL Cholesterol: 103 mg/dL — ABNORMAL HIGH (ref 0–99)
Total CHOL/HDL Ratio: 3.4 Ratio
Triglycerides: 125 mg/dL (ref <150)
VLDL: 25 mg/dL (ref 0–40)

## 2011-09-18 LAB — CBC WITH DIFFERENTIAL/PLATELET
Basophils Absolute: 0 10*3/uL (ref 0.0–0.1)
Basophils Relative: 0 % (ref 0–1)
Eosinophils Absolute: 0.1 10*3/uL (ref 0.0–0.7)
HCT: 35.2 % — ABNORMAL LOW (ref 36.0–46.0)
Hemoglobin: 12.1 g/dL (ref 12.0–15.0)
MCH: 29.2 pg (ref 26.0–34.0)
MCHC: 34.4 g/dL (ref 30.0–36.0)
Monocytes Absolute: 0.5 10*3/uL (ref 0.1–1.0)
Monocytes Relative: 10 % (ref 3–12)
Neutro Abs: 2.7 10*3/uL (ref 1.7–7.7)
Neutrophils Relative %: 54 % (ref 43–77)
RDW: 13 % (ref 11.5–15.5)

## 2011-09-18 LAB — HEMOGLOBIN A1C: Hgb A1c MFr Bld: 6.4 % — ABNORMAL HIGH (ref <5.7)

## 2011-09-18 LAB — POCT URINALYSIS DIPSTICK
Bilirubin, UA: NEGATIVE
Blood, UA: NEGATIVE
Leukocytes, UA: NEGATIVE
Nitrite, UA: NEGATIVE
Protein, UA: NEGATIVE
pH, UA: 6

## 2011-09-18 LAB — COMPREHENSIVE METABOLIC PANEL
AST: 21 U/L (ref 0–37)
Alkaline Phosphatase: 53 U/L (ref 39–117)
BUN: 22 mg/dL (ref 6–23)
Creat: 0.98 mg/dL (ref 0.50–1.10)
Glucose, Bld: 97 mg/dL (ref 70–99)
Total Bilirubin: 0.6 mg/dL (ref 0.3–1.2)

## 2011-09-18 LAB — TSH: TSH: 0.836 u[IU]/mL (ref 0.350–4.500)

## 2011-09-18 NOTE — Patient Instructions (Addendum)
Continue same meds and return in 6 months 

## 2011-09-18 NOTE — Progress Notes (Signed)
Subjective:    Patient ID: Sharon Peters, female    DOB: 1940-05-06, 71 y.o.   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 History of hypertension, history of grade 1 diastolic dysfunction, osteoarthritis of the hands, hypothyroidism, anxiety depression, type 2 diabetes mellitus. Once she retired from her job she was able to discontinue Lexapro. Hypothyroidism was diagnosed in 2000.  2010 she was diagnosed with diabetes mellitus. This is treated with metformin. She takes HCTZ and lisinopril for hypertension and for diastolic dysfunction. Had eye exam by optometrist 2012. Had Zostavax vaccine November 2011. Pneumovax immunization November 2011. Patient is intolerant of Celebrex it causes her eyes to swell.  Past medical history: Had left knee arthroscopic surgery around 1987, tubal ligation around 1979. In February 2012 was diagnosed with a probable small meniscal tear of her left knee and it was injected with steroids. Also has been diagnosed with left hip greater trochanteric bursitis.  Social history: He is divorced and lives alone. Does not smoke. Rare alcohol consumption. 2 adult children-a son and a daughter.  Family history: Father died at age 1 of pancreatic cancer. Mother died at age 77 of lung cancer. 3 brothers and one sister.    Review of Systems  Constitutional: Negative.   HENT: Negative.   Eyes: Negative.   Cardiovascular: Negative.   Gastrointestinal: Negative.   Genitourinary: Negative.   Musculoskeletal: Positive for arthralgias.  Neurological: Negative.   Hematological: Negative.   Psychiatric/Behavioral:       Occasional anxiety       Objective:   Physical Exam  Vitals reviewed. Constitutional: She is oriented to person, place, and time. She appears well-developed and well-nourished. No distress.    HENT:  Head: Normocephalic and atraumatic.  Right Ear: External ear normal.  Left Ear: External ear normal.  Mouth/Throat: Oropharynx is clear and moist.  Eyes: Conjunctivae normal and EOM are normal. Right eye exhibits no discharge. Left eye exhibits no discharge. No scleral icterus.  Neck: Neck supple. No JVD present. No thyromegaly present.  Cardiovascular: Normal rate, regular rhythm, normal heart sounds and intact distal pulses.   No murmur heard. Pulmonary/Chest: Effort normal and breath sounds normal. No respiratory distress. She has no wheezes. She has no rales. She exhibits no tenderness.       Breasts normal female without masses  Abdominal: Soft. Bowel sounds are normal. She exhibits no mass. There is no tenderness. There is no rebound.  Genitourinary:       Pap taken. No masses  Musculoskeletal: Normal range of motion. She exhibits no edema and no tenderness.       Diabetic foot exam without calluses, ulcers, pulses are normal.  Lymphadenopathy:    She has no cervical adenopathy.  Neurological: She is alert and oriented to person, place, and time. She has normal reflexes. No cranial nerve deficit. Coordination normal.  Skin: Skin is warm and dry. No rash noted. She is not diaphoretic.  Psychiatric: She has a normal mood and affect. Her behavior is normal. Judgment and thought content normal.      Subjective:   Patient presents for Medicare Annual/Subsequent preventive examination.   Review Past Medical/Family/Social:   Risk Factors  Current exercise habits:  Walk. Go to [employer] Dietary issues discussed: Low Fat Low carb diet  Cardiac risk factors: Obesity , Diabetes mellitus  Depression Screen  (Note: if answer  to either of the following is "Yes", a more complete depression screening is indicated)  Over the past two weeks, have you felt down, depressed or hopeless? No Over the past two weeks, have you felt little interest or pleasure in doing things? No Have you  lost interest or pleasure in daily life? No Do you often feel hopeless? No  Do you cry easily over simple problems? No   Activities of Daily Living  In your present state of health, do you have any difficulty performing the following activities?:  Driving? No  Managing money? No  Feeding yourself? No  Getting from bed to chair? No  Climbing a flight of stairs? No  Preparing food and eating?: No  Bathing or showering? No  Getting dressed: No  Getting to the toilet? No  Using the toilet:No  Moving around from place to place: No  In the past year have you fallen or had a near fall?:No  Are you sexually active? No  Do you have more than one partner? No   Hearing Difficulties: No  Do you often ask people to speak up or repeat themselves? No  Do you experience ringing or noises in your ears? Yes  Do you have difficulty understanding soft or whispered voices? No  Do you feel that you have a problem with memory? Yes  Do you often misplace items? No  Do you feel safe at home? Yes   Cognitive Testing  Alert? Yes Normal Appearance?Yes  Oriented to person? Yes Place? Yes  Time? Yes  Recall of three objects? Yes  Can perform simple calculations? Yes  Displays appropriate judgment?Yes  Can read the correct time from a watch face?Yes   List the Names of Other Physician/Practitioners you currently use: Dr. [name], Dr. Jerral Bonito   Indicate any recent Medical Services you may have received from other than [facility] providers in the past year (date may be approximate).   Screening Tests / Date Colonoscopy 03/29/10                     Zostavax Nov 2011 Mammogram  Fall 2012 Influenza Vaccine Fall 2012 Tetanus/tdap 2005   Objective:         General appearance: Appears stated age and mildly obese  Head: Normocephalic, without obvious abnormality, atraumatic  Eyes: conj clear, EOMi PEERLA  Ears: normal TM's and external ear canals both ears  Nose: Nares normal. Septum  midline. Mucosa normal. No drainage or sinus tenderness.  Throat: lips, mucosa, and tongue normal; teeth and gums normal  Neck: no adenopathy, no carotid bruit, no JVD, supple, symmetrical, trachea midline and thyroid not enlarged, symmetric, no tenderness/mass/nodules  No CVA tenderness.  Lungs: clear to auscultation bilaterally  Breasts: normal appearance, no masses or tenderness,   Heart: regular rate and rhythm, S1, S2 normal, no murmur, click, rub or gallop  Abdomen: soft, non-tender; bowel sounds normal; no masses, no organomegaly  Musculoskeletal: ROM normal in all joints, no crepitus, no deformity, Normal muscle strengthen. Back  is symmetric, no curvature. Skin: Skin color, texture, turgor normal. No rashes or lesions  Lymph nodes: Cervical, supraclavicular, and axillary nodes normal.  Neurologic: CN 2 -12 Normal, Normal symmetric reflexes. Normal coordination and gait  Psych: Alert & Oriented x 3, Mood appear stable.    Assessment:    Annual wellness medicare exam   Plan:    During the course of the visit the patient was educated and counseled about appropriate screening and preventive  services including:  Screening mammography  Colorectal cancer screening  Shingles vaccine done 2011 Screen - for depression. counseling.  Diet review for nutrition referral? Yes ____ Not Indicated __x__  Patient Instructions (the written plan) was given to the patient.  Medicare Attestation  I have personally reviewed:  The patient's medical and social history  Their use of alcohol, tobacco or illicit drugs  Their current medications and supplements  The patient's functional ability including ADLs,fall risks, home safety risks, cognitive, and hearing and visual impairment  Diet and physical activities  Evidence for depression or mood disorders  The patient's weight, height, BMI, and visual acuity have been recorded in the chart. I have made referrals, counseling, and provided education to  the patient based on review of the above and I have provided the patient with a written personalized care plan for preventive services.         Assessment & Plan:    Hypertension  Hypothyroidism  Type 2 diabetes mellitus  Osteoarthritis  Anxiety  Grade 1 diastolic dysfunction  Plan: Return in 6 months for office visit and hemoglobin A1c along with TSH. Continue same medications.

## 2011-09-19 ENCOUNTER — Other Ambulatory Visit (HOSPITAL_COMMUNITY)
Admission: RE | Admit: 2011-09-19 | Discharge: 2011-09-19 | Disposition: A | Discharge status: Home / Self Care | Payer: Medicare Other | Source: Ambulatory Visit | Attending: Internal Medicine | Admitting: Internal Medicine

## 2011-09-19 DIAGNOSIS — Z124 Encounter for screening for malignant neoplasm of cervix: Secondary | ICD-10-CM | POA: Insufficient documentation

## 2011-10-03 ENCOUNTER — Ambulatory Visit (INDEPENDENT_AMBULATORY_CARE_PROVIDER_SITE_OTHER): Payer: Medicare Other | Admitting: Internal Medicine

## 2011-10-03 DIAGNOSIS — Z23 Encounter for immunization: Secondary | ICD-10-CM

## 2012-02-15 ENCOUNTER — Other Ambulatory Visit: Payer: Self-pay | Admitting: Internal Medicine

## 2012-03-14 ENCOUNTER — Other Ambulatory Visit: Payer: Self-pay | Admitting: Internal Medicine

## 2012-03-14 DIAGNOSIS — E039 Hypothyroidism, unspecified: Secondary | ICD-10-CM

## 2012-03-14 DIAGNOSIS — E119 Type 2 diabetes mellitus without complications: Secondary | ICD-10-CM

## 2012-03-14 LAB — HEMOGLOBIN A1C: Mean Plasma Glucose: 146 mg/dL — ABNORMAL HIGH (ref <117)

## 2012-03-18 ENCOUNTER — Ambulatory Visit: Payer: Medicare Other | Admitting: Internal Medicine

## 2012-03-31 ENCOUNTER — Ambulatory Visit: Payer: Medicare Other | Admitting: Internal Medicine

## 2012-03-31 ENCOUNTER — Encounter: Payer: Self-pay | Admitting: Internal Medicine

## 2012-03-31 VITALS — BP 118/66 | HR 80 | Temp 98.3°F | Wt 182.0 lb

## 2012-03-31 DIAGNOSIS — E039 Hypothyroidism, unspecified: Secondary | ICD-10-CM

## 2012-03-31 DIAGNOSIS — E119 Type 2 diabetes mellitus without complications: Secondary | ICD-10-CM

## 2012-03-31 DIAGNOSIS — E785 Hyperlipidemia, unspecified: Secondary | ICD-10-CM

## 2012-03-31 DIAGNOSIS — I10 Essential (primary) hypertension: Secondary | ICD-10-CM

## 2012-03-31 DIAGNOSIS — J069 Acute upper respiratory infection, unspecified: Secondary | ICD-10-CM

## 2012-03-31 MED ORDER — AZITHROMYCIN 250 MG PO TABS: ORAL_TABLET | ORAL | Status: DC

## 2012-03-31 NOTE — Progress Notes (Signed)
  Subjective:    Patient ID: Sharon Peters, female    DOB: [birth year], 72 y.o.   MRN: [id number]  HPI In  today for six-month recheck on hypothyroidism, type 2 diabetes mellitus. She also has a history of hyperlipidemia. Blood pressure is acceptable today. She has not been exercising over the winter. Stopped going to [employer] because of expense. Also has developed an acute upper respiratory infection and feels tight in her sinuses. Has a congested cough. No fever or shaking chills. TSH is within normal limits on current dose of Synthroid. Hemoglobin A1c has increased just a bit from 6.4-6.7%. Review of Systems     Objective:   Physical Exam   Skin is warm and dry ;Neck is supple without JVD thyromegaly or carotid bruits. Chest clear to auscultation. HEENT exam: TMs are clear bilaterally. Pharynx is clear. Cardiac exam: Regular rate and rhythm normal S1 and S2; extremities without edema        Assessment & Plan:  Acute upper respiratory infection  Hypertension-stable on current regimen  Hyperlipidemia-not on statin  Hypothyroidism-stable on current dose of Synthroid  Type 2 diabetes mellitus-has increased slightly with lack of exercise  Plan: Continue same dose of Synthroid, continue metformin for diabetes, continue HCTZ and lisinopril. Return in 6 months for physical exam. For acute URI, prescribed generic Zithromax Z-Pak. Encouraged diet and exercise.  Time spent 25 minutes

## 2012-03-31 NOTE — Patient Instructions (Addendum)
Take Zithromax Z-PAK as directed. Continue same medications and return in 6 months. Return to exercising.

## 2012-08-12 ENCOUNTER — Ambulatory Visit: Payer: 59 | Admitting: Cardiology

## 2012-08-12 ENCOUNTER — Other Ambulatory Visit: Payer: Self-pay | Admitting: Internal Medicine

## 2012-09-16 DIAGNOSIS — R0789 Other chest pain: Secondary | ICD-10-CM | POA: Insufficient documentation

## 2012-09-16 DIAGNOSIS — I10 Essential (primary) hypertension: Secondary | ICD-10-CM | POA: Insufficient documentation

## 2012-09-16 DIAGNOSIS — R9430 Abnormal result of cardiovascular function study, unspecified: Secondary | ICD-10-CM | POA: Insufficient documentation

## 2012-09-16 DIAGNOSIS — E039 Hypothyroidism, unspecified: Secondary | ICD-10-CM | POA: Insufficient documentation

## 2012-09-18 ENCOUNTER — Ambulatory Visit (INDEPENDENT_AMBULATORY_CARE_PROVIDER_SITE_OTHER): Payer: Medicare Other | Admitting: Cardiology

## 2012-09-18 ENCOUNTER — Encounter: Payer: Self-pay | Admitting: Cardiology

## 2012-09-18 VITALS — BP 114/68 | HR 67 | Ht 65.5 in | Wt 181.2 lb

## 2012-09-18 DIAGNOSIS — I319 Disease of pericardium, unspecified: Secondary | ICD-10-CM

## 2012-09-18 DIAGNOSIS — I313 Pericardial effusion (noninflammatory): Secondary | ICD-10-CM

## 2012-09-18 DIAGNOSIS — I10 Essential (primary) hypertension: Secondary | ICD-10-CM

## 2012-09-18 DIAGNOSIS — R0789 Other chest pain: Secondary | ICD-10-CM

## 2012-09-18 DIAGNOSIS — R9431 Abnormal electrocardiogram [ECG] [EKG]: Secondary | ICD-10-CM

## 2012-09-18 NOTE — Assessment & Plan Note (Signed)
Patient had a very small pericardial effusion in the past. It is time to do a followup echo to reassess LV function and Sharon Peters pericardial effusion. This is being arranged. I will then communicate with Sharon Peters. If it is normal I will not need to see Sharon Peters for further cardiac workup.

## 2012-09-18 NOTE — Patient Instructions (Addendum)
**Note De-Identified Sharon Peters Obfuscation** Your physician has requested that you have an echocardiogram. Echocardiography is a painless test that uses sound waves to create images of your heart. It provides your doctor with information about the size and shape of your heart and how well your heart's chambers and valves are working. This procedure takes approximately one hour. There are no restrictions for this procedure.  Your physician recommends that you schedule a follow-up appointment in: depends on results of Echo

## 2012-09-18 NOTE — Assessment & Plan Note (Signed)
The patient his EKG continues to be abnormal. It is unchanged from the past.

## 2012-09-18 NOTE — Progress Notes (Signed)
HPI  The patient is seen for a 2 year followup. I had seen her in the past with an abnormal EKG. Echo had shown good left ventricular function. There was a trivial pericardial effusion. We have watched her over time and she's done very well. She has some osteoarthritis of the hands. She does not have any other more formal arthritic diagnosis. She is fully active. She's not having chest pain or shortness of breath. There no palpitations.  Allergies  Allergen Reactions  . Celecoxib Swelling    Eyes swell shut    Current Outpatient Prescriptions  Medication Sig Dispense Refill  . cholecalciferol (VITAMIN D) 1000 UNITS tablet Take 1,000 Units by mouth daily.        . fish oil-omega-3 fatty acids 1000 MG capsule Take by mouth daily. Take 3 by mouth once daily       . GLUCOSAMINE-CHONDROITIN PO Take by mouth. Take 3 by mouth once daily       . hydrochlorothiazide (HYDRODIURIL) 25 MG tablet take 1 tablet by mouth once daily  90 tablet  PRN  . levothyroxine (SYNTHROID, LEVOTHROID) 75 MCG tablet take 1 tablet by mouth once daily  90 tablet  PRN  . lisinopril (PRINIVIL,ZESTRIL) 10 MG tablet take 1 tablet by mouth once daily  90 tablet  0  . metFORMIN (GLUCOPHAGE) 500 MG tablet take 1 tablet by mouth once daily  90 tablet  PRN  . zolpidem (AMBIEN) 10 MG tablet Take 10 mg by mouth at bedtime as needed.         No current facility-administered medications for this visit.    History   Social History  . Marital Status: Divorced    Spouse Name: N/A    Number of Children: N/A  . Years of Education: N/A   Occupational History  . retired    Social History Main Topics  . Smoking status: Never Smoker   . Smokeless tobacco: Not on file  . Alcohol Use: Yes     Comment: rarely  . Drug Use: Not on file  . Sexual Activity: Not on file   Other Topics Concern  . Not on file   Social History Narrative  . No narrative on file    Family History  Problem Relation Age of Onset  . Diabetes     2 siblings  . Cancer    . Cancer Mother   . Cancer Father     Past Medical History  Diagnosis Date  . Diabetes mellitus type II   . HTN (hypertension)   . Pericardial effusion     Trivial, echo, February, 2011  /     small...int he past...unexplained...normal sedimentation rate and ANA  . Chest discomfort     Nuclear, December, 2008, normal  . Hypothyroidism   . Obesity   . Ejection fraction     EF 65%, February, 2010, trivial pericardial effusion,    Past Surgical History  Procedure Laterality Date  . Tubal ligation    . Left arthroscopic knee surgery      Patient Active Problem List   Diagnosis Date Noted  . Diastolic dysfunction 08/14/2010    Priority: High  . HTN (hypertension)   . Chest discomfort   . Hypothyroidism   . Ejection fraction   . Hyperlipidemia 03/23/2011  . Pericardial effusion   . Insomnia 08/14/2010  . ELECTROCARDIOGRAM, ABNORMAL 07/01/2009  . HYPOTHYROIDISM 06/30/2009  . DIABETES MELLITUS, TYPE II 04/27/2008  . HYPERTENSION 04/27/2008  . ARTHRITIS 04/27/2008  ROS   Patient denies fever, chills, headache, sweats, rash, change in vision, change in hearing, chest pain, cough, nausea vomiting, urinary symptoms. All other systems are reviewed and are negative.  PHYSICAL EXAM  Patient looks fine. She is oriented to person time and place. Affect is normal. There is no jugulovenous distention. Lungs are clear. Respiratory effort is nonlabored. Cardiac exam reveals S1 and S2. There no clicks or significant murmurs. The abdomen is soft. There is no peripheral edema.  Filed Vitals:   09/18/12 1128  BP: 114/68  Pulse: 67  Height: 5' 5.5" (1.664 m)  Weight: 181 lb 3.2 oz (82.192 kg)   EKG is done today and reviewed by me. 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 This is unchanged from the past.  ASSESSMENT & PLAN

## 2012-09-18 NOTE — Assessment & Plan Note (Signed)
No recurrent chest pain. 

## 2012-09-30 ENCOUNTER — Encounter: Payer: Self-pay | Admitting: Internal Medicine

## 2012-09-30 ENCOUNTER — Ambulatory Visit (INDEPENDENT_AMBULATORY_CARE_PROVIDER_SITE_OTHER): Payer: Medicare Other | Admitting: Internal Medicine

## 2012-09-30 ENCOUNTER — Other Ambulatory Visit: Payer: Medicare Other | Admitting: Internal Medicine

## 2012-09-30 VITALS — BP 126/62 | HR 76 | Temp 98.3°F | Ht 65.5 in | Wt 180.0 lb

## 2012-09-30 DIAGNOSIS — I10 Essential (primary) hypertension: Secondary | ICD-10-CM

## 2012-09-30 DIAGNOSIS — G4700 Insomnia, unspecified: Secondary | ICD-10-CM

## 2012-09-30 DIAGNOSIS — I519 Heart disease, unspecified: Secondary | ICD-10-CM

## 2012-09-30 DIAGNOSIS — Z130 Encounter for screening for diseases of the blood and blood-forming organs and certain disorders involving the immune mechanism: Secondary | ICD-10-CM

## 2012-09-30 DIAGNOSIS — E119 Type 2 diabetes mellitus without complications: Secondary | ICD-10-CM

## 2012-09-30 DIAGNOSIS — E039 Hypothyroidism, unspecified: Secondary | ICD-10-CM

## 2012-09-30 DIAGNOSIS — E785 Hyperlipidemia, unspecified: Secondary | ICD-10-CM

## 2012-09-30 DIAGNOSIS — Z0000 Encounter for general adult medical examination without abnormal findings: Secondary | ICD-10-CM

## 2012-09-30 DIAGNOSIS — I5189 Other ill-defined heart diseases: Secondary | ICD-10-CM

## 2012-09-30 DIAGNOSIS — Z23 Encounter for immunization: Secondary | ICD-10-CM

## 2012-09-30 LAB — COMPREHENSIVE METABOLIC PANEL
ALT: 16 U/L (ref 0–35)
AST: 20 U/L (ref 0–37)
Alkaline Phosphatase: 60 U/L (ref 39–117)
BUN: 23 mg/dL (ref 6–23)
Chloride: 101 mEq/L (ref 96–112)
Creat: 1.11 mg/dL — ABNORMAL HIGH (ref 0.50–1.10)
Potassium: 4 mEq/L (ref 3.5–5.3)

## 2012-09-30 LAB — CBC WITH DIFFERENTIAL/PLATELET
Basophils Absolute: 0 10*3/uL (ref 0.0–0.1)
Basophils Relative: 1 % (ref 0–1)
Eosinophils Relative: 3 % (ref 0–5)
HCT: 37.1 % (ref 36.0–46.0)
Lymphocytes Relative: 35 % (ref 12–46)
MCHC: 32.6 g/dL (ref 30.0–36.0)
Monocytes Absolute: 0.5 10*3/uL (ref 0.1–1.0)
Neutro Abs: 2.7 10*3/uL (ref 1.7–7.7)
Platelets: 232 10*3/uL (ref 150–400)
RDW: 13.7 % (ref 11.5–15.5)
WBC: 5.2 10*3/uL (ref 4.0–10.5)

## 2012-09-30 LAB — LIPID PANEL
HDL: 55 mg/dL (ref >39)
LDL Cholesterol: 110 mg/dL — ABNORMAL HIGH (ref 0–99)
Total CHOL/HDL Ratio: 3.6 Ratio

## 2012-09-30 LAB — POCT URINALYSIS DIPSTICK
Glucose, UA: NEGATIVE
Leukocytes, UA: NEGATIVE
Spec Grav, UA: 1.02
Urobilinogen, UA: NEGATIVE

## 2012-09-30 LAB — TSH: TSH: 2.756 u[IU]/mL (ref 0.350–4.500)

## 2012-09-30 MED ORDER — METFORMIN HCL 500 MG PO TABS: 500.0000 mg | ORAL_TABLET | Freq: Every day | ORAL | Status: DC

## 2012-09-30 MED ORDER — ZOLPIDEM TARTRATE 10 MG PO TABS: 10.0000 mg | ORAL_TABLET | Freq: Every evening | ORAL | Status: DC | PRN

## 2012-09-30 MED ORDER — LEVOTHYROXINE SODIUM 75 MCG PO TABS: 75.0000 ug | ORAL_TABLET | Freq: Every day | ORAL | Status: DC

## 2012-09-30 MED ORDER — HYDROCHLOROTHIAZIDE 25 MG PO TABS: 25.0000 mg | ORAL_TABLET | Freq: Every day | ORAL | Status: DC

## 2012-09-30 NOTE — Progress Notes (Signed)
Subjective:    Patient ID: Sharon Peters, female    DOB: 10-13-1940, 72 y.o.   MRN: [id number]  HPI 72 year old White female for health maintenance and evaluation of medical problems. Hx hypothyroidism, HTN, AODM, Grade I  Diastolic dysfunction, hyperlipidemia,insomnia. Saw Dr. Myrtis Ser recently for diastolic dysfunction and is to have Echocardiogram soon. Walks a lot for exercise. Says Accuchecks are excellent.   Hypothyroidism was diagnosed in 2000. In 2010 she was diagnosed with diabetes mellitus. This is been treated with metformin. Takes HCTZ and lisinopril for hypertension and for diastolic dysfunction.  Zostavax vaccine November 2011, Pneumovax immunization November 2011.  Patient is intolerant of Celebrex as it causes her eyes to swell.  Social history: She is divorced and resides alone. Does not smoke. Rare alcohol consumption. 2 adult children, a son and a daughter.  Past medical history: Left knee arthroscopic surgery around 1987, tubal ligation around 1979. In February 2012 was diagnosed with probable small meniscal tear of her left knee and it was injected with steroids. Also has been diagnosed in the past with left hip greater trochanteric bursitis.  FHx: Brother with history of liver cancer secondary to Hepatitis B and is on transplant list. Father died at age 62 of pancreatic cancer. Mother died at age 54 of lung cancer. 3 brothers and one sister.  Review of Systems  Constitutional: Positive for fatigue.  HENT: Negative.   Eyes: Negative.   Respiratory: Negative.   Cardiovascular: Negative.   Gastrointestinal: Negative.   Genitourinary: Negative.   Allergic/Immunologic: Negative.   Neurological: Negative.   Hematological: Negative.   Psychiatric/Behavioral: Negative.   All other systems reviewed and are negative.       Objective:   Physical Exam  Vitals reviewed. Constitutional: She is oriented to person, place, and time. She appears well-developed and  well-nourished. No distress.  HENT:  Head: Normocephalic and atraumatic.  Right Ear: External ear normal.  Left Ear: External ear normal.  Mouth/Throat: Oropharynx is clear and moist. No oropharyngeal exudate.  Eyes: Conjunctivae and EOM are normal. Pupils are equal, round, and reactive to light. Right eye exhibits no discharge. Left eye exhibits no discharge. No scleral icterus.  Neck: Neck supple. No JVD present. No thyromegaly present.  Cardiovascular: Normal rate, regular rhythm, normal heart sounds and intact distal pulses.   No murmur heard. Pulmonary/Chest: Effort normal and breath sounds normal. No respiratory distress. She has no rales. She exhibits no tenderness.  Breasts normal female  Abdominal: Soft. She exhibits no distension and no mass. There is no tenderness. There is no rebound and no guarding.  Genitourinary:  Pap done 2013. No masses on bimanual exam  Musculoskeletal: Normal range of motion. She exhibits no edema.  Lymphadenopathy:    She has no cervical adenopathy.  Neurological: She is alert and oriented to person, place, and time. She has normal reflexes. She displays normal reflexes. No cranial nerve deficit. Coordination normal.  Skin: Skin is warm and dry. No rash noted. She is not diaphoretic.  Psychiatric: She has a normal mood and affect. Her behavior is normal. Judgment and thought content normal.          Assessment & Plan:  Hypertension-stable on current regimen  Hypothyroidism-stable on thyroid replacement there.  Adult onset diabetes mellitus stable with metformin  History of grade 1 diastolic dysfunction-treated by Dr. Myrtis Ser and followed by him. Had 2-D echocardiogram in the near future.  History of insomnia-treated with Ambien  Osteoarthritis-patient takes Cossman and chondroitin sulfate  Plan: Patient agrees to take influenza vaccine. Return in 6 months for office visit, hemoglobin A1c, TSH and blood pressure check       Subjective:    Patient presents for Medicare Annual/Subsequent preventive examination.   Review Past Medical/Family/Social: see above   Risk Factors  Current exercise habits: walks a great deal-- 250 miles since June Dietary issues discussed: low fat low carb  Cardiac risk factors: HTN, Hyperlipidemia, AODM  Depression Screen  (Note: if answer to either of the following is "Yes", a more complete depression screening is indicated)   Over the past two weeks, have you felt down, depressed or hopeless? No  Over the past two weeks, have you felt little interest or pleasure in doing things? No Have you lost interest or pleasure in daily life? No Do you often feel hopeless? No Do you cry easily over simple problems? No   Activities of Daily Living  In your present state of health, do you have any difficulty performing the following activities?    Driving? No  Managing money? No  Feeding yourself? No  Getting from bed to chair? No  Climbing a flight of stairs? No  Preparing food and eating?: No  Bathing or showering? No  Getting dressed: No  Getting to the toilet? No  Using the toilet:No  Moving around from place to place: No  In the past year have you fallen or had a near fall?:No  Are you sexually active? No  Do you have more than one partner? No   Hearing Difficulties: No  Do you often ask people to speak up or repeat themselves? No  Do you experience ringing or noises in your ears? No  Do you have difficulty understanding soft or whispered voices? No  Do you feel that you have a problem with memory? No Do you often misplace items? No    Home Safety:  Do you have a smoke alarm at your residence? Yes Do you have grab bars in the bathroom? no Do you have throw rugs in your house? one   Cognitive Testing  Alert? Yes Normal Appearance?Yes  Oriented to person? Yes Place? Yes  Time? Yes  Recall of three objects? Yes  Can perform simple calculations? Yes  Displays appropriate  judgment?Yes  Can read the correct time from a watch face?Yes   List the Names of Other Physician/Practitioners you currently use:  See referral list for the physicians patient is currently seeing.  DR. Stefanie Libel- opthalmology; Dr. Myrtis Ser cardiology and to have new Echo soon on Sept 22 to at diastolic dysfunction   Review of Systems: see above   Objective:     General appearance: Appears stated age and mildly obese  Head: Normocephalic, without obvious abnormality, atraumatic  Eyes: conj clear, EOMi PEERLA  Ears: normal TM's and external ear canals both ears  Nose: Nares normal. Septum midline. Mucosa normal. No drainage or sinus tenderness.  Throat: lips, mucosa, and tongue normal; teeth and gums normal  Neck: no adenopathy, no carotid bruit, no JVD, supple, symmetrical, trachea midline and thyroid not enlarged, symmetric, no tenderness/mass/nodules  No CVA tenderness.  Lungs: clear to auscultation bilaterally  Breasts: normal appearance, no masses or tenderness. Heart: regular rate and rhythm, S1, S2 normal, no murmur, click, rub or gallop  Abdomen: soft, non-tender; bowel sounds normal; no masses, no organomegaly  Musculoskeletal: ROM normal in all joints, no crepitus, no deformity, Normal muscle strengthen. Back  is symmetric, no curvature. Skin: Skin color, texture, turgor normal.  No rashes or lesions  Lymph nodes: Cervical, supraclavicular, and axillary nodes normal.  Neurologic: CN 2 -12 Normal, Normal symmetric reflexes. Normal coordination and gait  Psych: Alert & Oriented x 3, Mood appear stable.    Assessment:    Annual wellness medicare exam   Plan:    During the course of the visit the patient was educated and counseled about appropriate screening and preventive services including:   Eye exam Dr. Charlotte Sanes November 2013 Annual mammogram Colonoscopy 2012 Annual flu vaccine     Patient Instructions (the written plan) was given to the patient.  Medicare Attestation   I have personally reviewed:  The patient's medical and social history  Their use of alcohol, tobacco or illicit drugs  Their current medications and supplements  The patient's functional ability including ADLs,fall risks, home safety risks, cognitive, and hearing and visual impairment  Diet and physical activities  Evidence for depression or mood disorders  The patient's weight, height, BMI, and visual acuity have been recorded in the chart. I have made referrals, counseling, and provided education to the patient based on review of the above and I have provided the patient with a written personalized care plan for preventive services.

## 2012-09-30 NOTE — Patient Instructions (Addendum)
Continue same meds and return in 6 months 

## 2012-10-01 LAB — VITAMIN D 25 HYDROXY (VIT D DEFICIENCY, FRACTURES): Vit D, 25-Hydroxy: 50 ng/mL (ref 30–89)

## 2012-10-04 ENCOUNTER — Encounter: Payer: Self-pay | Admitting: Internal Medicine

## 2012-10-06 ENCOUNTER — Ambulatory Visit (HOSPITAL_COMMUNITY): Payer: Medicare Other | Attending: Cardiology | Admitting: Radiology

## 2012-10-06 DIAGNOSIS — R072 Precordial pain: Secondary | ICD-10-CM

## 2012-10-06 DIAGNOSIS — E039 Hypothyroidism, unspecified: Secondary | ICD-10-CM | POA: Insufficient documentation

## 2012-10-06 DIAGNOSIS — I10 Essential (primary) hypertension: Secondary | ICD-10-CM | POA: Insufficient documentation

## 2012-10-06 DIAGNOSIS — R079 Chest pain, unspecified: Secondary | ICD-10-CM | POA: Insufficient documentation

## 2012-10-06 DIAGNOSIS — E785 Hyperlipidemia, unspecified: Secondary | ICD-10-CM | POA: Insufficient documentation

## 2012-10-06 DIAGNOSIS — E119 Type 2 diabetes mellitus without complications: Secondary | ICD-10-CM | POA: Insufficient documentation

## 2012-10-06 DIAGNOSIS — I313 Pericardial effusion (noninflammatory): Secondary | ICD-10-CM

## 2012-10-06 NOTE — Progress Notes (Signed)
Echocardiogram performed.  

## 2012-10-07 ENCOUNTER — Encounter: Payer: Self-pay | Admitting: Cardiology

## 2012-10-07 NOTE — Progress Notes (Signed)
   Two-dimensional echo has been done. The ejection fraction is 55-60%. There is no residual pericardial effusion. This is a normal study. The patient is being contacted. She will not need any further cardiology followup.  Jerral Bonito, MD

## 2012-10-10 ENCOUNTER — Telehealth: Payer: Self-pay | Admitting: Cardiology

## 2012-10-10 NOTE — Telephone Encounter (Signed)
Follow Up:    Pt states she is returning Lynn's call and would like her echo results

## 2012-10-10 NOTE — Telephone Encounter (Signed)
**Note De-Identified Sharon Peters Obfuscation** Pt given test results, she verbalized understanding.

## 2012-10-16 ENCOUNTER — Telehealth: Payer: Self-pay

## 2012-10-16 DIAGNOSIS — Z1239 Encounter for other screening for malignant neoplasm of breast: Secondary | ICD-10-CM

## 2012-11-18 ENCOUNTER — Other Ambulatory Visit: Payer: Self-pay | Admitting: Internal Medicine

## 2013-02-17 ENCOUNTER — Other Ambulatory Visit: Payer: Self-pay | Admitting: Internal Medicine

## 2013-03-31 ENCOUNTER — Other Ambulatory Visit: Payer: Medicare Other | Admitting: Internal Medicine

## 2013-03-31 DIAGNOSIS — E119 Type 2 diabetes mellitus without complications: Secondary | ICD-10-CM

## 2013-03-31 DIAGNOSIS — I10 Essential (primary) hypertension: Secondary | ICD-10-CM

## 2013-03-31 DIAGNOSIS — E559 Vitamin D deficiency, unspecified: Secondary | ICD-10-CM

## 2013-03-31 DIAGNOSIS — E039 Hypothyroidism, unspecified: Secondary | ICD-10-CM

## 2013-03-31 DIAGNOSIS — Z130 Encounter for screening for diseases of the blood and blood-forming organs and certain disorders involving the immune mechanism: Secondary | ICD-10-CM

## 2013-03-31 DIAGNOSIS — E785 Hyperlipidemia, unspecified: Secondary | ICD-10-CM

## 2013-03-31 LAB — CBC WITH DIFFERENTIAL/PLATELET
BASOS PCT: 1 % (ref 0–1)
Basophils Absolute: 0 10*3/uL (ref 0.0–0.1)
EOS ABS: 0.1 10*3/uL (ref 0.0–0.7)
Eosinophils Relative: 3 % (ref 0–5)
HEMATOCRIT: 34.9 % — AB (ref 36.0–46.0)
HEMOGLOBIN: 11.9 g/dL — AB (ref 12.0–15.0)
Lymphocytes Relative: 35 % (ref 12–46)
Lymphs Abs: 1.7 10*3/uL (ref 0.7–4.0)
MCH: 29.5 pg (ref 26.0–34.0)
MCHC: 34.1 g/dL (ref 30.0–36.0)
MCV: 86.4 fL (ref 78.0–100.0)
MONO ABS: 0.5 10*3/uL (ref 0.1–1.0)
MONOS PCT: 11 % (ref 3–12)
Neutro Abs: 2.4 10*3/uL (ref 1.7–7.7)
Neutrophils Relative %: 50 % (ref 43–77)
Platelets: 214 10*3/uL (ref 150–400)
RBC: 4.04 MIL/uL (ref 3.87–5.11)
RDW: 13.6 % (ref 11.5–15.5)
WBC: 4.8 10*3/uL (ref 4.0–10.5)

## 2013-03-31 LAB — COMPREHENSIVE METABOLIC PANEL
ALK PHOS: 56 U/L (ref 39–117)
ALT: 16 U/L (ref 0–35)
AST: 23 U/L (ref 0–37)
Albumin: 4.4 g/dL (ref 3.5–5.2)
BILIRUBIN TOTAL: 0.9 mg/dL (ref 0.2–1.2)
BUN: 24 mg/dL — AB (ref 6–23)
CO2: 26 mEq/L (ref 19–32)
CREATININE: 1.24 mg/dL — AB (ref 0.50–1.10)
Calcium: 9.3 mg/dL (ref 8.4–10.5)
Chloride: 98 mEq/L (ref 96–112)
GLUCOSE: 99 mg/dL (ref 70–99)
Potassium: 4.7 mEq/L (ref 3.5–5.3)
Sodium: 133 mEq/L — ABNORMAL LOW (ref 135–145)
Total Protein: 7 g/dL (ref 6.0–8.3)

## 2013-03-31 LAB — LIPID PANEL
Cholesterol: 159 mg/dL (ref 0–200)
HDL: 54 mg/dL (ref >39)
LDL CALC: 82 mg/dL (ref 0–99)
Total CHOL/HDL Ratio: 2.9 Ratio
Triglycerides: 115 mg/dL (ref <150)
VLDL: 23 mg/dL (ref 0–40)

## 2013-03-31 LAB — HEMOGLOBIN A1C
Hgb A1c MFr Bld: 6.2 % — ABNORMAL HIGH (ref <5.7)
Mean Plasma Glucose: 131 mg/dL — ABNORMAL HIGH (ref <117)

## 2013-03-31 LAB — TSH: TSH: 1.735 u[IU]/mL (ref 0.350–4.500)

## 2013-04-01 LAB — VITAMIN D 25 HYDROXY (VIT D DEFICIENCY, FRACTURES): VIT D 25 HYDROXY: 40 ng/mL (ref 30–89)

## 2013-04-07 ENCOUNTER — Ambulatory Visit: Payer: Medicare Other | Admitting: Internal Medicine

## 2013-04-07 ENCOUNTER — Other Ambulatory Visit: Payer: Medicare Other | Admitting: Internal Medicine

## 2013-05-05 ENCOUNTER — Ambulatory Visit (INDEPENDENT_AMBULATORY_CARE_PROVIDER_SITE_OTHER): Payer: Medicare Other | Admitting: Internal Medicine

## 2013-05-05 ENCOUNTER — Other Ambulatory Visit: Payer: Self-pay

## 2013-05-05 ENCOUNTER — Encounter: Payer: Self-pay | Admitting: Internal Medicine

## 2013-05-05 ENCOUNTER — Other Ambulatory Visit: Payer: Self-pay | Admitting: Internal Medicine

## 2013-05-05 VITALS — BP 120/72 | HR 68 | Temp 98.9°F | Wt 179.0 lb

## 2013-05-05 DIAGNOSIS — E669 Obesity, unspecified: Secondary | ICD-10-CM

## 2013-05-05 DIAGNOSIS — M19049 Primary osteoarthritis, unspecified hand: Secondary | ICD-10-CM

## 2013-05-05 DIAGNOSIS — R799 Abnormal finding of blood chemistry, unspecified: Secondary | ICD-10-CM

## 2013-05-05 DIAGNOSIS — M19041 Primary osteoarthritis, right hand: Secondary | ICD-10-CM

## 2013-05-05 DIAGNOSIS — I10 Essential (primary) hypertension: Secondary | ICD-10-CM

## 2013-05-05 DIAGNOSIS — M19042 Primary osteoarthritis, left hand: Secondary | ICD-10-CM

## 2013-05-05 DIAGNOSIS — E785 Hyperlipidemia, unspecified: Secondary | ICD-10-CM

## 2013-05-05 DIAGNOSIS — E039 Hypothyroidism, unspecified: Secondary | ICD-10-CM

## 2013-05-05 DIAGNOSIS — Z23 Encounter for immunization: Secondary | ICD-10-CM | POA: Diagnosis not present

## 2013-05-05 DIAGNOSIS — E119 Type 2 diabetes mellitus without complications: Secondary | ICD-10-CM

## 2013-05-05 DIAGNOSIS — G4700 Insomnia, unspecified: Secondary | ICD-10-CM

## 2013-05-05 DIAGNOSIS — R7989 Other specified abnormal findings of blood chemistry: Secondary | ICD-10-CM

## 2013-05-05 LAB — BASIC METABOLIC PANEL WITH GFR
BUN: 18 mg/dL (ref 6–23)
CO2: 28 meq/L (ref 19–32)
Calcium: 9.9 mg/dL (ref 8.4–10.5)
Chloride: 100 meq/L (ref 96–112)
Creat: 0.88 mg/dL (ref 0.50–1.10)
Glucose, Bld: 93 mg/dL (ref 70–99)
Potassium: 4.5 meq/L (ref 3.5–5.3)
Sodium: 135 meq/L (ref 135–145)

## 2013-05-05 LAB — SEDIMENTATION RATE: SED RATE: 4 mm/h (ref 0–22)

## 2013-05-05 LAB — RHEUMATOID FACTOR: Rhuematoid fact SerPl-aCnc: 76 IU/mL — ABNORMAL HIGH (ref <=14)

## 2013-05-05 MED ORDER — TETANUS-DIPHTH-ACELL PERTUSSIS 5-2.5-18.5 LF-MCG/0.5 IM SUSP: 0.5000 mL | Freq: Once | INTRAMUSCULAR | Status: DC
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
## 2013-05-05 NOTE — Patient Instructions (Signed)
Rheumatology referral will be made. Arthritis lab studies have been drawn. Serum creatinine repeated today. Return in 6 months for physical exam. Tetanus immunization update given today.

## 2013-05-05 NOTE — Telephone Encounter (Signed)
Refill x 6 months 

## 2013-05-06 LAB — ANA: ANA: NEGATIVE

## 2013-05-06 LAB — CYCLIC CITRUL PEPTIDE ANTIBODY, IGG: Cyclic Citrullin Peptide Ab: <2 U/mL (ref 0.0–5.0)

## 2013-05-13 ENCOUNTER — Other Ambulatory Visit: Payer: Self-pay | Admitting: Internal Medicine

## 2013-06-10 ENCOUNTER — Encounter: Payer: Self-pay | Admitting: Internal Medicine

## 2013-11-05 ENCOUNTER — Other Ambulatory Visit: Payer: Medicare Other | Admitting: Internal Medicine

## 2013-11-05 DIAGNOSIS — I10 Essential (primary) hypertension: Secondary | ICD-10-CM

## 2013-11-05 DIAGNOSIS — Z0000 Encounter for general adult medical examination without abnormal findings: Secondary | ICD-10-CM

## 2013-11-05 DIAGNOSIS — E038 Other specified hypothyroidism: Secondary | ICD-10-CM

## 2013-11-05 DIAGNOSIS — E785 Hyperlipidemia, unspecified: Secondary | ICD-10-CM

## 2013-11-05 DIAGNOSIS — E119 Type 2 diabetes mellitus without complications: Secondary | ICD-10-CM

## 2013-11-05 LAB — COMPREHENSIVE METABOLIC PANEL
ALT: 17 U/L (ref 0–35)
AST: 22 U/L (ref 0–37)
Albumin: 4.4 g/dL (ref 3.5–5.2)
Alkaline Phosphatase: 58 U/L (ref 39–117)
BUN: 30 mg/dL — ABNORMAL HIGH (ref 6–23)
CHLORIDE: 102 meq/L (ref 96–112)
CO2: 23 meq/L (ref 19–32)
Calcium: 9.4 mg/dL (ref 8.4–10.5)
Creat: 1.13 mg/dL — ABNORMAL HIGH (ref 0.50–1.10)
Glucose, Bld: 103 mg/dL — ABNORMAL HIGH (ref 70–99)
Potassium: 4.3 mEq/L (ref 3.5–5.3)
Sodium: 135 mEq/L (ref 135–145)
Total Bilirubin: 0.7 mg/dL (ref 0.2–1.2)
Total Protein: 6.8 g/dL (ref 6.0–8.3)

## 2013-11-05 LAB — LIPID PANEL
CHOL/HDL RATIO: 3.2 ratio
CHOLESTEROL: 184 mg/dL (ref 0–200)
HDL: 57 mg/dL (ref >39)
LDL Cholesterol: 98 mg/dL (ref 0–99)
TRIGLYCERIDES: 147 mg/dL (ref <150)
VLDL: 29 mg/dL (ref 0–40)

## 2013-11-05 LAB — CBC WITH DIFFERENTIAL/PLATELET
Basophils Absolute: 0.1 10*3/uL (ref 0.0–0.1)
Basophils Relative: 1 % (ref 0–1)
Eosinophils Absolute: 0.2 10*3/uL (ref 0.0–0.7)
Eosinophils Relative: 3 % (ref 0–5)
HCT: 35.7 % — ABNORMAL LOW (ref 36.0–46.0)
Hemoglobin: 11.9 g/dL — ABNORMAL LOW (ref 12.0–15.0)
LYMPHS PCT: 36 % (ref 12–46)
Lymphs Abs: 1.8 10*3/uL (ref 0.7–4.0)
MCH: 29.4 pg (ref 26.0–34.0)
MCHC: 33.3 g/dL (ref 30.0–36.0)
MCV: 88.1 fL (ref 78.0–100.0)
MONO ABS: 0.6 10*3/uL (ref 0.1–1.0)
Monocytes Relative: 11 % (ref 3–12)
Neutro Abs: 2.5 10*3/uL (ref 1.7–7.7)
Neutrophils Relative %: 49 % (ref 43–77)
Platelets: 208 10*3/uL (ref 150–400)
RBC: 4.05 MIL/uL (ref 3.87–5.11)
RDW: 13.4 % (ref 11.5–15.5)
WBC: 5 10*3/uL (ref 4.0–10.5)

## 2013-11-05 LAB — TSH: TSH: 2.126 u[IU]/mL (ref 0.350–4.500)

## 2013-11-06 ENCOUNTER — Encounter: Payer: Self-pay | Admitting: Internal Medicine

## 2013-11-06 ENCOUNTER — Ambulatory Visit (INDEPENDENT_AMBULATORY_CARE_PROVIDER_SITE_OTHER): Payer: Medicare Other | Admitting: Internal Medicine

## 2013-11-06 VITALS — BP 118/72 | HR 73 | Temp 98.6°F | Ht 65.25 in | Wt 182.0 lb

## 2013-11-06 DIAGNOSIS — Z23 Encounter for immunization: Secondary | ICD-10-CM

## 2013-11-06 DIAGNOSIS — E119 Type 2 diabetes mellitus without complications: Secondary | ICD-10-CM

## 2013-11-06 DIAGNOSIS — E785 Hyperlipidemia, unspecified: Secondary | ICD-10-CM

## 2013-11-06 DIAGNOSIS — E039 Hypothyroidism, unspecified: Secondary | ICD-10-CM

## 2013-11-06 DIAGNOSIS — M19041 Primary osteoarthritis, right hand: Secondary | ICD-10-CM

## 2013-11-06 DIAGNOSIS — I10 Essential (primary) hypertension: Secondary | ICD-10-CM

## 2013-11-06 DIAGNOSIS — M19042 Primary osteoarthritis, left hand: Secondary | ICD-10-CM

## 2013-11-06 DIAGNOSIS — E669 Obesity, unspecified: Secondary | ICD-10-CM

## 2013-11-06 DIAGNOSIS — I5189 Other ill-defined heart diseases: Secondary | ICD-10-CM

## 2013-11-06 DIAGNOSIS — Z0000 Encounter for general adult medical examination without abnormal findings: Secondary | ICD-10-CM

## 2013-11-06 DIAGNOSIS — G4700 Insomnia, unspecified: Secondary | ICD-10-CM

## 2013-11-06 DIAGNOSIS — M722 Plantar fascial fibromatosis: Secondary | ICD-10-CM

## 2013-11-06 DIAGNOSIS — I519 Heart disease, unspecified: Secondary | ICD-10-CM

## 2013-11-06 LAB — POCT URINALYSIS DIPSTICK
Bilirubin, UA: NEGATIVE
Blood, UA: NEGATIVE
GLUCOSE UA: NEGATIVE
Ketones, UA: NEGATIVE
Leukocytes, UA: NEGATIVE
Nitrite, UA: NEGATIVE
Protein, UA: NEGATIVE
Urobilinogen, UA: NEGATIVE
pH, UA: 5

## 2013-11-06 LAB — HEMOGLOBIN A1C
Hgb A1c MFr Bld: 6.2 % — ABNORMAL HIGH (ref <5.7)
Mean Plasma Glucose: 131 mg/dL — ABNORMAL HIGH (ref <117)

## 2013-11-06 MED ORDER — LEVOTHYROXINE SODIUM 75 MCG PO TABS: ORAL_TABLET | ORAL | Status: DC

## 2013-11-06 MED ORDER — METFORMIN HCL 500 MG PO TABS: ORAL_TABLET | ORAL | Status: DC

## 2013-11-06 MED ORDER — ZOLPIDEM TARTRATE 10 MG PO TABS: ORAL_TABLET | ORAL | Status: DC

## 2013-11-06 MED ORDER — LISINOPRIL 10 MG PO TABS: ORAL_TABLET | ORAL | Status: DC

## 2013-11-06 MED ORDER — HYDROCHLOROTHIAZIDE 25 MG PO TABS: 25.0000 mg | ORAL_TABLET | Freq: Every day | ORAL | Status: DC

## 2013-11-06 NOTE — Progress Notes (Signed)
Subjective:    Patient ID: Sharon Peters, female    DOB: 08-21-1940, 73 y.o.   MRN: [id number]  HPI  73 year old Female for health maintenance and evaluation of medical issues. Has had issues with hand arthritis and plantar fasciitis worse in left foor than right. Considering seeing ortho for plantar fasciitis. Taking Aleve 2 tabs bid.  Due for eye exam December by  Dr.McCuen. She has a history of hypothyroidism, hypertension, diastolic dysfunction, type 2 diabetes mellitus, insomnia, obesity.  Hypothyroidism was diagnosed in 2000. In 2010 she was diagnosed with diabetes mellitus. She takes HCTZ and lisinopril for hypertension and for diastolic dysfunction which is been described as grade 1. She is seeing Dr. Ron Parker for this.  Zostavax vaccine November 2011. Pneumovax immunization November 2011.  Patient is intolerant of Celebrex as it causes her eyes to swell.  Past medical history: Left knee arthroscopic surgery around 1987, tubal ligation around 1979. In February 2012 was diagnosed with probable small meniscal tear of the left knee. It was injected with steroids. On his been diagnosed in the past with left hip greater trochanteric bursitis.  Social history: She is divorced and resides alone. Is retired. Does not smoke. Rare alcohol consumption. 2 adult children, a son and a daughter.  Family history: Brother with history of liver cancer secondary to hepatitis B and is on transplant list. Father died at age 91 of pancreatic cancer. Mother died at age 27 of lung cancer. 3 brothers and one sister.  She walks a lot for exercise.    Review of Systems  Constitutional: Positive for fatigue.  Musculoskeletal:       Plantar fasciitis  All other systems reviewed and are negative.      Objective:   Physical Exam  Constitutional: She is oriented to person, place, and time. She appears well-developed and well-nourished. No distress.  HENT:  Head: Normocephalic.  Right Ear: External ear  normal.  Left Ear: External ear normal.  Mouth/Throat: Oropharynx is clear and moist. No oropharyngeal exudate.  Eyes: Conjunctivae and EOM are normal. Pupils are equal, round, and reactive to light. Right eye exhibits no discharge. Left eye exhibits no discharge. No scleral icterus.  Neck: Neck supple. No JVD present. No thyromegaly present.  Cardiovascular: Normal rate, regular rhythm, normal heart sounds and intact distal pulses.   No murmur heard. Pulmonary/Chest: Effort normal and breath sounds normal. No respiratory distress. She has no wheezes. She has no rales. She exhibits no tenderness.  Breasts normal female  Abdominal: Soft. Bowel sounds are normal. She exhibits no distension and no mass. There is no tenderness. There is no rebound and no guarding.  Genitourinary:  Pap taken 2013. No masses on bimanual exam.  Musculoskeletal: Normal range of motion. She exhibits no edema.  Lymphadenopathy:    She has no cervical adenopathy.  Neurological: She is alert and oriented to person, place, and time. She has normal reflexes. No cranial nerve deficit. Coordination normal.  Skin: Skin is warm and dry. No rash noted. She is not diaphoretic.  Psychiatric: She has a normal mood and affect. Her behavior is normal. Judgment and thought content normal.  Vitals reviewed.         Assessment & Plan:  Hypertension  Type 2 diabetes mellitus  Hypothyroidism  Diastolic dysfunction  Obesity  Insomnia  Plantar fasciitis  Osteoarthritis of the hands-currently taking Aleve  Plan: Continue diet and exercise regimen. Continue same medications and return in 6 months. Consider orthopedist for plantar  fasciitis. Shown how to do exercises to stretch plantar fascia.  Subjective:   Patient presents for Medicare Annual/Subsequent preventive examination.  Review Past Medical/Family/Social: See above  Risk Factors  Current exercise habits: Walks a lot Dietary issues discussed: Low fat low  carb  Cardiac risk factors:  Depression Screen  (Note: if answer to either of the following is "Yes", a more complete depression screening is indicated)   Over the past two weeks, have you felt down, depressed or hopeless? No  Over the past two weeks, have you felt little interest or pleasure in doing things? No Have you lost interest or pleasure in daily life? No Do you often feel hopeless? No Do you cry easily over simple problems? No   Activities of Daily Living  In your present state of health, do you have any difficulty performing the following activities?:   Driving? No  Managing money? No  Feeding yourself? No  Getting from bed to chair? No  Climbing a flight of stairs? No  Preparing food and eating?: No  Bathing or showering? No  Getting dressed: No  Getting to the toilet? No  Using the toilet:No  Moving around from place to place: No  In the past year have you fallen or had a near fall?:No  Are you sexually active? No  Do you have more than one partner? No   Hearing Difficulties: No  Do you often ask people to speak up or repeat themselves? No  Do you experience ringing or noises in your ears? No  Do you have difficulty understanding soft or whispered voices? No  Do you feel that you have a problem with memory? No Do you often misplace items? No    Home Safety:  Do you have a smoke alarm at your residence? Yes Do you have grab bars in the bathroom? no Do you have throw rugs in your house? yes   Cognitive Testing  Alert? Yes Normal Appearance?Yes  Oriented to person? Yes Place? Yes  Time? Yes  Recall of three objects? Yes  Can perform simple calculations? Yes  Displays appropriate judgment?Yes  Can read the correct time from a watch face?Yes   List the Names of Other Physician/Practitioners you currently use:  See referral list for the physicians patient is currently seeing.  Dr. Ellie Lunch physician  Dr. Ron Parker cardiologist   Review of Systems: See  above   Objective:     General appearance: Appears stated age and mildly obese  Head: Normocephalic, without obvious abnormality, atraumatic  Eyes: conj clear, EOMi PEERLA  Ears: normal TM's and external ear canals both ears  Nose: Nares normal. Septum midline. Mucosa normal. No drainage or sinus tenderness.  Throat: lips, mucosa, and tongue normal; teeth and gums normal  Neck: no adenopathy, no carotid bruit, no JVD, supple, symmetrical, trachea midline and thyroid not enlarged, symmetric, no tenderness/mass/nodules  No CVA tenderness.  Lungs: clear to auscultation bilaterally  Breasts: normal appearance, no masses or tenderness Heart: regular rate and rhythm, S1, S2 normal, no murmur, click, rub or gallop  Abdomen: soft, non-tender; bowel sounds normal; no masses, no organomegaly  Musculoskeletal: ROM normal in all joints, no crepitus, no deformity, Normal muscle strengthen. Back  is symmetric, no curvature. Skin: Skin color, texture, turgor normal. No rashes or lesions  Lymph nodes: Cervical, supraclavicular, and axillary nodes normal.  Neurologic: CN 2 -12 Normal, Normal symmetric reflexes. Normal coordination and gait  Psych: Alert & Oriented x 3, Mood appear stable.  Assessment:    Annual wellness medicare exam   Plan:    During the course of the visit the patient was educated and counseled about appropriate screening and preventive services including:  Annual mammogram      Patient Instructions (the written plan) was given to the patient.  Medicare Attestation  I have personally reviewed:  The patient's medical and social history  Their use of alcohol, tobacco or illicit drugs  Their current medications and supplements  The patient's functional ability including ADLs,fall risks, home safety risks, cognitive, and hearing and visual impairment  Diet and physical activities  Evidence for depression or mood disorders  The patient's weight, height, BMI, and visual  acuity have been recorded in the chart. I have made referrals, counseling, and provided education to the patient based on review of the above and I have provided the patient with a written personalized care plan for preventive services.

## 2013-11-07 LAB — MICROALBUMIN, URINE: Microalb, Ur: 0.4 mg/dL (ref <2.0)

## 2013-11-12 ENCOUNTER — Telehealth: Payer: Self-pay | Admitting: Internal Medicine

## 2013-11-12 NOTE — Telephone Encounter (Signed)
This sounds like positional vertigo. Try over the counter Meclizine 3 times daily. I told her it could recur.

## 2013-11-12 NOTE — Telephone Encounter (Signed)
Patient informed to try meclizine tid OTC, for possible vertigo.

## 2013-11-12 NOTE — Telephone Encounter (Signed)
Patient states she woke up about 5:30 a.m. Today to go to the bathroom and had vertigo and had to hold on to doorfacing, etc.  She laid back down and got up again about 8:15 and it was better, but not as severe as it was at 5:30.  She was a little nauseous as she was at 5:30 a.m.   She states the room wasn't spinning this time.    Back in July, she laid down once in the bed one night and the room was spinning and it lasted about a week.  It wasn't severe.  State she didn't have it treated at that time.    She just wanted to make you aware that this has happened now twice.  Is there anything we need to do at this time?  She is able to walk around this afternoon.  She has a slight headache this afternoon.  The nausea is gone this afternoon.  Her BP was 144/60 about 1:45 this afternoon (sitting).  She took her meds about lunch time today.

## 2013-12-14 NOTE — Patient Instructions (Signed)
Continue same medications and return in 6 months. Consider seeing orthopedist for plantar fasciitis.

## 2013-12-15 DIAGNOSIS — Z23 Encounter for immunization: Secondary | ICD-10-CM

## 2014-01-05 ENCOUNTER — Telehealth: Payer: Self-pay | Admitting: *Deleted

## 2014-01-05 NOTE — Telephone Encounter (Signed)
Patient states she is having cough, sore neck glands, headache and her eyes are burning. Her symptoms started approx 1 week ago. She would like to know if you will call her in a Zpak.

## 2014-01-05 NOTE — Telephone Encounter (Signed)
Notified patient . Scheduled appt for evaluation for 10:15 on 01/06/14

## 2014-01-05 NOTE — Telephone Encounter (Signed)
Needs OV tomorrow.

## 2014-01-06 ENCOUNTER — Encounter: Payer: Self-pay | Admitting: Internal Medicine

## 2014-01-06 ENCOUNTER — Ambulatory Visit (INDEPENDENT_AMBULATORY_CARE_PROVIDER_SITE_OTHER): Payer: Medicare Other | Admitting: Internal Medicine

## 2014-01-06 VITALS — BP 138/72 | HR 84 | Temp 97.7°F | Wt 186.0 lb

## 2014-01-06 DIAGNOSIS — J069 Acute upper respiratory infection, unspecified: Secondary | ICD-10-CM

## 2014-01-06 MED ORDER — AZITHROMYCIN 250 MG PO TABS: ORAL_TABLET | ORAL | Status: DC

## 2014-01-06 MED ORDER — BENZONATATE 100 MG PO CAPS: 200.0000 mg | ORAL_CAPSULE | Freq: Three times a day (TID) | ORAL | Status: DC

## 2014-01-06 NOTE — Progress Notes (Signed)
   Subjective:    Patient ID: Sharon Peters, female    DOB: March 26, 1940, 73 y.o.   MRN: [id number]  HPI Patient in today with URI symptoms. Has had cough,  congestion, eyes burning and headache. No fever or shaking chills. Has had symptoms for several days.    Review of Systems     Objective:   Physical Exam  Skin warm and dry. Nodes none. TMs are clear. Pharynx slightly injected without exudate. Neck is supple. Chest clear to auscultation without rales or wheezing      Assessment & Plan:  Acute URI  Plan: Zithromax Z-PAK take 2 tablets day one followed by 1 tablet days 2 through 5. Tessalon Perles 200 mg 3 times daily as needed for cough. Call if not better in 10 days or sooner if worse.

## 2014-02-20 ENCOUNTER — Encounter: Payer: Self-pay | Admitting: Internal Medicine

## 2014-02-20 NOTE — Patient Instructions (Signed)
Takes Zithromax Z-PAK as directed. Take Tessalon Perles as directed for cough. Call if not better in 10 days or sooner if worse.

## 2014-05-06 ENCOUNTER — Other Ambulatory Visit: Payer: Medicare Other | Admitting: Internal Medicine

## 2014-05-06 DIAGNOSIS — E785 Hyperlipidemia, unspecified: Secondary | ICD-10-CM

## 2014-05-06 DIAGNOSIS — E039 Hypothyroidism, unspecified: Secondary | ICD-10-CM

## 2014-05-06 DIAGNOSIS — E119 Type 2 diabetes mellitus without complications: Secondary | ICD-10-CM

## 2014-05-06 DIAGNOSIS — Z79899 Other long term (current) drug therapy: Secondary | ICD-10-CM

## 2014-05-06 LAB — HEMOGLOBIN A1C
Hgb A1c MFr Bld: 6.3 % — ABNORMAL HIGH (ref <5.7)
Mean Plasma Glucose: 134 mg/dL — ABNORMAL HIGH (ref <117)

## 2014-05-07 LAB — HEPATIC FUNCTION PANEL
ALT: 24 U/L (ref 0–35)
AST: 27 U/L (ref 0–37)
Albumin: 4.4 g/dL (ref 3.5–5.2)
Alkaline Phosphatase: 51 U/L (ref 39–117)
BILIRUBIN DIRECT: 0.1 mg/dL (ref 0.0–0.3)
Indirect Bilirubin: 0.7 mg/dL (ref 0.2–1.2)
Total Bilirubin: 0.8 mg/dL (ref 0.2–1.2)
Total Protein: 7 g/dL (ref 6.0–8.3)

## 2014-05-07 LAB — TSH: TSH: 2.147 u[IU]/mL (ref 0.350–4.500)

## 2014-05-07 LAB — LIPID PANEL
Cholesterol: 184 mg/dL (ref 0–200)
HDL: 53 mg/dL (ref >=46)
LDL CALC: 103 mg/dL — AB (ref 0–99)
TRIGLYCERIDES: 141 mg/dL (ref <150)
Total CHOL/HDL Ratio: 3.5 Ratio
VLDL: 28 mg/dL (ref 0–40)

## 2014-05-11 ENCOUNTER — Other Ambulatory Visit: Payer: Medicare Other | Admitting: Internal Medicine

## 2014-05-13 ENCOUNTER — Ambulatory Visit (INDEPENDENT_AMBULATORY_CARE_PROVIDER_SITE_OTHER): Payer: Medicare Other | Admitting: Internal Medicine

## 2014-05-13 ENCOUNTER — Encounter: Payer: Self-pay | Admitting: Internal Medicine

## 2014-05-13 VITALS — BP 128/78 | HR 71 | Temp 97.9°F | Wt 178.0 lb

## 2014-05-13 DIAGNOSIS — E039 Hypothyroidism, unspecified: Secondary | ICD-10-CM

## 2014-05-13 DIAGNOSIS — E669 Obesity, unspecified: Secondary | ICD-10-CM

## 2014-05-13 DIAGNOSIS — E785 Hyperlipidemia, unspecified: Secondary | ICD-10-CM

## 2014-05-13 DIAGNOSIS — E8881 Metabolic syndrome: Secondary | ICD-10-CM

## 2014-05-13 DIAGNOSIS — G4700 Insomnia, unspecified: Secondary | ICD-10-CM

## 2014-05-13 DIAGNOSIS — E119 Type 2 diabetes mellitus without complications: Secondary | ICD-10-CM | POA: Diagnosis not present

## 2014-05-13 DIAGNOSIS — I10 Essential (primary) hypertension: Secondary | ICD-10-CM

## 2014-05-13 MED ORDER — ZOLPIDEM TARTRATE 10 MG PO TABS: ORAL_TABLET | ORAL | Status: DC

## 2014-05-13 NOTE — Progress Notes (Addendum)
   Subjective:    Patient ID: Sharon Peters, female    DOB: 1940/11/18, 74 y.o.   MRN: [id number]  HPI  74 year old White Female in today for six-month recheck. She has a history of hypertension, hyperlipidemia, hypothyroidism, osteoarthritis, type 2 diabetes mellitus. 6 months ago creatinine was 1.13 and prior to that 1.24. She does take Voltaren and that could be contributing to her elevated serum creatinine. My guess is she has chronic kidney disease related to NSAIDS, diabetes and hypertension. Never had ultrasound of her kidneys. She's lost 4 pounds since last visit. Hasn't been walking as much this past few months. Used to go to [employer]. Hemoglobin is stable at 6.3%. Creatinine 1.130. TSH 2.147. Lipid panel within normal limits.  Had diabetic eye exam by Dr. Ellie Lunch  in November she says.  Brother recently got liver transplant because of chronic hepatitis B.  History of elevated rheumatoid factor at 77 with a negative CCP. Has been evaluated by rheumatologist. Arthritis mainly affects her hands.    Review of Systems     Objective:   Physical Exam  Skin warm and dry. Nodes none. Neck is supple without JVD thyromegaly or carotid bruits. Chest clear to auscultation. Cardiac exam regular rate and rhythm normal S1 and S2. Extremities without edema.      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 Return in 6 months for physical examination.  30 minutes spent with patient answering questions. Ambien refilled.

## 2014-05-13 NOTE — Patient Instructions (Signed)
Return in 6 months for physical exam. To receive Prevnar next month. Please restart diet and exercise program.

## 2014-05-14 LAB — MICROALBUMIN / CREATININE URINE RATIO
Creatinine, Urine: 53 mg/dL
MICROALB UR: 0.2 mg/dL (ref <2.0)
Microalb Creat Ratio: 3.8 mg/g (ref 0.0–30.0)

## 2014-05-28 ENCOUNTER — Other Ambulatory Visit: Payer: Self-pay | Admitting: *Deleted

## 2014-05-28 MED ORDER — ZOLPIDEM TARTRATE 5 MG PO TABS
5.0000 mg | ORAL_TABLET | Freq: Every evening | ORAL | Status: DC | PRN
Start: 2014-05-28 — End: 2015-05-26

## 2014-05-28 NOTE — Telephone Encounter (Signed)
ambien script changed to 5 mg tablet due to insurance coverage.

## 2014-06-15 ENCOUNTER — Ambulatory Visit (INDEPENDENT_AMBULATORY_CARE_PROVIDER_SITE_OTHER): Payer: Medicare Other | Admitting: Internal Medicine

## 2014-06-15 ENCOUNTER — Encounter: Payer: Self-pay | Admitting: Internal Medicine

## 2014-06-15 VITALS — BP 130/80 | HR 70 | Temp 97.7°F

## 2014-06-15 DIAGNOSIS — Z23 Encounter for immunization: Secondary | ICD-10-CM | POA: Diagnosis not present

## 2014-06-15 NOTE — Progress Notes (Signed)
Patient presents today for Prevnar vaccine. VS stable. Patient tolerated injection well.

## 2014-08-13 ENCOUNTER — Encounter: Payer: Self-pay | Admitting: Internal Medicine

## 2014-10-02 ENCOUNTER — Other Ambulatory Visit: Payer: Self-pay | Admitting: Internal Medicine

## 2014-11-09 ENCOUNTER — Other Ambulatory Visit: Payer: Medicare Other | Admitting: Internal Medicine

## 2014-11-09 DIAGNOSIS — E119 Type 2 diabetes mellitus without complications: Secondary | ICD-10-CM

## 2014-11-09 DIAGNOSIS — I10 Essential (primary) hypertension: Secondary | ICD-10-CM

## 2014-11-09 DIAGNOSIS — E785 Hyperlipidemia, unspecified: Secondary | ICD-10-CM

## 2014-11-09 DIAGNOSIS — Z0000 Encounter for general adult medical examination without abnormal findings: Secondary | ICD-10-CM

## 2014-11-09 DIAGNOSIS — E039 Hypothyroidism, unspecified: Secondary | ICD-10-CM

## 2014-11-09 LAB — CBC WITH DIFFERENTIAL/PLATELET
BASOS PCT: 0 % (ref 0–1)
Basophils Absolute: 0 10*3/uL (ref 0.0–0.1)
Eosinophils Absolute: 0.1 10*3/uL (ref 0.0–0.7)
Eosinophils Relative: 3 % (ref 0–5)
HCT: 36.9 % (ref 36.0–46.0)
HEMOGLOBIN: 12.1 g/dL (ref 12.0–15.0)
Lymphocytes Relative: 32 % (ref 12–46)
Lymphs Abs: 1.5 10*3/uL (ref 0.7–4.0)
MCH: 29.4 pg (ref 26.0–34.0)
MCHC: 32.8 g/dL (ref 30.0–36.0)
MCV: 89.8 fL (ref 78.0–100.0)
MPV: 11 fL (ref 8.6–12.4)
Monocytes Absolute: 0.5 10*3/uL (ref 0.1–1.0)
Monocytes Relative: 10 % (ref 3–12)
NEUTROS ABS: 2.6 10*3/uL (ref 1.7–7.7)
NEUTROS PCT: 55 % (ref 43–77)
Platelets: 205 10*3/uL (ref 150–400)
RBC: 4.11 MIL/uL (ref 3.87–5.11)
RDW: 13 % (ref 11.5–15.5)
WBC: 4.8 10*3/uL (ref 4.0–10.5)

## 2014-11-09 LAB — TSH: TSH: 2.829 u[IU]/mL (ref 0.350–4.500)

## 2014-11-10 LAB — LIPID PANEL
CHOL/HDL RATIO: 3.2 ratio (ref <=5.0)
Cholesterol: 189 mg/dL (ref 125–200)
HDL: 59 mg/dL (ref >=46)
LDL CALC: 101 mg/dL (ref <130)
Triglycerides: 146 mg/dL (ref <150)
VLDL: 29 mg/dL (ref <30)

## 2014-11-10 LAB — HEMOGLOBIN A1C
HEMOGLOBIN A1C: 6.2 % — AB (ref <5.7)
Mean Plasma Glucose: 131 mg/dL — ABNORMAL HIGH (ref <117)

## 2014-11-10 LAB — COMPLETE METABOLIC PANEL WITH GFR
ALBUMIN: 4.6 g/dL (ref 3.6–5.1)
ALK PHOS: 57 U/L (ref 33–130)
ALT: 42 U/L — ABNORMAL HIGH (ref 6–29)
AST: 37 U/L — AB (ref 10–35)
BUN: 29 mg/dL — AB (ref 7–25)
CO2: 25 mmol/L (ref 20–31)
Calcium: 9.8 mg/dL (ref 8.6–10.4)
Chloride: 101 mmol/L (ref 98–110)
Creat: 1.22 mg/dL — ABNORMAL HIGH (ref 0.60–0.93)
GFR, Est African American: 51 mL/min — ABNORMAL LOW (ref >=60)
GFR, Est Non African American: 44 mL/min — ABNORMAL LOW (ref >=60)
GLUCOSE: 95 mg/dL (ref 65–99)
POTASSIUM: 4.6 mmol/L (ref 3.5–5.3)
SODIUM: 137 mmol/L (ref 135–146)
Total Bilirubin: 0.8 mg/dL (ref 0.2–1.2)
Total Protein: 7.2 g/dL (ref 6.1–8.1)

## 2014-11-10 LAB — MICROALBUMIN, URINE: Microalb, Ur: 0.6 mg/dL

## 2014-11-11 ENCOUNTER — Encounter: Payer: Self-pay | Admitting: Internal Medicine

## 2014-11-11 ENCOUNTER — Ambulatory Visit (INDEPENDENT_AMBULATORY_CARE_PROVIDER_SITE_OTHER): Payer: Medicare Other | Admitting: Internal Medicine

## 2014-11-11 VITALS — BP 130/74 | HR 64 | Temp 97.4°F | Resp 18 | Ht 65.0 in | Wt 179.0 lb

## 2014-11-11 DIAGNOSIS — G4700 Insomnia, unspecified: Secondary | ICD-10-CM | POA: Diagnosis not present

## 2014-11-11 DIAGNOSIS — M19042 Primary osteoarthritis, left hand: Secondary | ICD-10-CM | POA: Diagnosis not present

## 2014-11-11 DIAGNOSIS — E039 Hypothyroidism, unspecified: Secondary | ICD-10-CM | POA: Diagnosis not present

## 2014-11-11 DIAGNOSIS — E119 Type 2 diabetes mellitus without complications: Secondary | ICD-10-CM | POA: Diagnosis not present

## 2014-11-11 DIAGNOSIS — M722 Plantar fascial fibromatosis: Secondary | ICD-10-CM

## 2014-11-11 DIAGNOSIS — M19041 Primary osteoarthritis, right hand: Secondary | ICD-10-CM | POA: Diagnosis not present

## 2014-11-11 DIAGNOSIS — E669 Obesity, unspecified: Secondary | ICD-10-CM | POA: Diagnosis not present

## 2014-11-11 DIAGNOSIS — I5189 Other ill-defined heart diseases: Secondary | ICD-10-CM

## 2014-11-11 DIAGNOSIS — I519 Heart disease, unspecified: Secondary | ICD-10-CM | POA: Diagnosis not present

## 2014-11-11 DIAGNOSIS — R748 Abnormal levels of other serum enzymes: Secondary | ICD-10-CM

## 2014-11-11 DIAGNOSIS — I10 Essential (primary) hypertension: Secondary | ICD-10-CM

## 2014-11-11 DIAGNOSIS — Z0000 Encounter for general adult medical examination without abnormal findings: Secondary | ICD-10-CM

## 2014-11-11 DIAGNOSIS — Z23 Encounter for immunization: Secondary | ICD-10-CM | POA: Diagnosis not present

## 2014-11-11 DIAGNOSIS — E8881 Metabolic syndrome: Secondary | ICD-10-CM | POA: Diagnosis not present

## 2014-11-11 DIAGNOSIS — R7989 Other specified abnormal findings of blood chemistry: Secondary | ICD-10-CM

## 2014-11-11 LAB — POCT URINALYSIS DIPSTICK
Bilirubin, UA: NEGATIVE
Blood, UA: NEGATIVE
Glucose, UA: NEGATIVE
KETONES UA: NEGATIVE
LEUKOCYTES UA: NEGATIVE
NITRITE UA: NEGATIVE
PH UA: 6.5
PROTEIN UA: NEGATIVE
Spec Grav, UA: 1.02
UROBILINOGEN UA: 0.2

## 2014-11-15 NOTE — Patient Instructions (Signed)
Continue same medications. Continue diet exercise and weight loss efforts. Return in 6 months for follow-up.

## 2014-11-15 NOTE — Progress Notes (Signed)
Subjective:    Patient ID: Sharon Peters, female    DOB: May 20, 1940, 74 y.o.   MRN: [id number]  HPI 74 year old White Female with history of hypothyroidism, metabolic syndrome, controlled type 2 diabetes mellitus, hypertension, hyperlipidemia in today for health maintenance exam and evaluation of medical problems. History of obesity and metabolic syndrome. Has been diagnosed with plantar fasciitis of her foot. Having osteoarthritis pain in fingers of hands. History of left ventricular diastolic dysfunction.  Diabetic eye exam done December 2016 and a be repeated 2017 by Dr. nightly.  Hypothyroidism diagnosed in 2000. In 2010 was diagnosed with diabetes mellitus. Takes HCTZ and lisinopril for hypertension. Has seen Dr. Ron Parker for diastolic dysfunction.  Pneumovax immunization 2011. Zostavax vaccine 2011.  Patient is intolerant of Celebrex as it causes her eyes to swell.  Past medical history: Left knee arthroscopic surgery around 1987. Tubal ligation around 1979. In 2012 was diagnosed with probable small meniscal tear of the left knee. It was injected with steroids. Also has been diagnosed in the past with trochanteric bursitis left hip.  She walks a lot for exercise.  Social history: She is divorced and resides alone. Is retired. Does not smoke. Rare alcohol consumption. 2 adult children, son and a daughter  Family history: Brother with history of liver cancer secondary to hepatitis B and is on transplant list. Father died at age 72 of pancreatic cancer. Mother died at age 54 of lung cancer. 3 brothers and one sister.    Review of Systems plantar fasciitis otherwise negative except for osteoarthritis of the hands     Objective:   Physical Exam  Constitutional: She is oriented to person, place, and time. She appears well-developed and well-nourished. No distress.  HENT:  Head: Normocephalic and atraumatic.  Right Ear: External ear normal.  Left Ear: External ear normal.    Mouth/Throat: Oropharynx is clear and moist. No oropharyngeal exudate.  Eyes: Conjunctivae and EOM are normal. Pupils are equal, round, and reactive to light. Right eye exhibits no discharge. Left eye exhibits no discharge. No scleral icterus.  Neck: Neck supple. No JVD present. No thyromegaly present.  Cardiovascular: Normal rate, regular rhythm, normal heart sounds and intact distal pulses.   Pulmonary/Chest: Effort normal and breath sounds normal. She has no wheezes. She has no rales.  Breasts normal female without masses  Abdominal: Soft. Bowel sounds are normal. She exhibits no distension and no mass. There is no tenderness. There is no rebound and no guarding.  Genitourinary:  Bimanual exam normal. Pap deferred  Lymphadenopathy:    She has no cervical adenopathy.  Neurological: She is alert and oriented to person, place, and time. She has normal reflexes. No cranial nerve deficit.  Skin: Skin is warm and dry. No rash noted. She is not diaphoretic.  Psychiatric: She has a normal mood and affect. Her behavior is normal. Judgment and thought content normal.  Vitals reviewed.         Assessment & Plan:  Essential hypertension-blood pressure stable area of hypothyroidism-stable on thyroid replacement  Controlled type 2 diabetes mellitus-stable  Osteoarthritis both hands  Plantar fasciitis or ref hyperlipidemia-stable  Metabolic syndrome  Left ventricular diastolic dysfunction  History of insomnia  Obesity  Elevated serum creatinine at 1.22 continue to monitor in 6 months  Plan: Return in 6 months or as needed. Continue diet exercise and weight loss efforts. Continue same medications. Have annual mammogram. Annual flu vaccine recommended.   Subjective:   Patient presents for Medicare Annual/Subsequent preventive examination.  Review Past Medical/Family/Social: See above   Risk Factors  Current exercise habits: Walks some for exercise Dietary issues discussed: Low  fat low carbohydrate  Cardiac risk factors: Metabolic syndrome, hyperlipidemia, hypertension, diabetes  Depression Screen  (Note: if answer to either of the following is "Yes", a more complete depression screening is indicated)   Over the past two weeks, have you felt down, depressed or hopeless? No  Over the past two weeks, have you felt little interest or pleasure in doing things? No Have you lost interest or pleasure in daily life? No Do you often feel hopeless? No Do you cry easily over simple problems? No   Activities of Daily Living  In your present state of health, do you have any difficulty performing the following activities?:   Driving? No  Managing money? No  Feeding yourself? No  Getting from bed to chair? No  Climbing a flight of stairs? No  Preparing food and eating?: No  Bathing or showering? No  Getting dressed: No  Getting to the toilet? No  Using the toilet:No  Moving around from place to place: No  In the past year have you fallen or had a near fall?:No  Are you sexually active? No  Do you have more than one partner? No   Hearing Difficulties: No  Do you often ask people to speak up or repeat themselves? No  Do you experience ringing or noises in your ears? No  Do you have difficulty understanding soft or whispered voices? No  Do you feel that you have a problem with memory? No Do you often misplace items? No    Home Safety:  Do you have a smoke alarm at your residence? Yes Do you have grab bars in the bathroom? No Do you have throw rugs in your house? No   Cognitive Testing  Alert? Yes Normal Appearance?Yes  Oriented to person? Yes Place? Yes  Time? Yes  Recall of three objects? Yes  Can perform simple calculations? Yes  Displays appropriate judgment?Yes  Can read the correct time from a watch face?Yes   List the Names of Other Physician/Practitioners you currently use:  See referral list for the physicians patient is currently seeing.      Review of Systems: See above  Objective:     General appearance: Appears stated age and mildly obese  Head: Normocephalic, without obvious abnormality, atraumatic  Eyes: conj clear, EOMi PEERLA  Ears: normal TM's and external ear canals both ears  Nose: Nares normal. Septum midline. Mucosa normal. No drainage or sinus tenderness.  Throat: lips, mucosa, and tongue normal; teeth and gums normal  Neck: no adenopathy, no carotid bruit, no JVD, supple, symmetrical, trachea midline and thyroid not enlarged, symmetric, no tenderness/mass/nodules  No CVA tenderness.  Lungs: clear to auscultation bilaterally  Breasts: normal appearance, no masses or tenderness Heart: regular rate and rhythm, S1, S2 normal, no murmur, click, rub or gallop  Abdomen: soft, non-tender; bowel sounds normal; no masses, no organomegaly  Musculoskeletal: ROM normal in all joints, no crepitus, no deformity, Normal muscle strengthen. Back  is symmetric, no curvature. Skin: Skin color, texture, turgor normal. No rashes or lesions  Lymph nodes: Cervical, supraclavicular, and axillary nodes normal.  Neurologic: CN 2 -12 Normal, Normal symmetric reflexes. Normal coordination and gait  Psych: Alert & Oriented x 3, Mood appear stable.    Assessment:    Annual wellness medicare exam   Plan:    During the course of the visit the patient  was educated and counseled about appropriate screening and preventive services including:   Annual mammogram  Annual flu vaccine     Patient Instructions (the written plan) was given to the patient.  Medicare Attestation  I have personally reviewed:  The patient's medical and social history  Their use of alcohol, tobacco or illicit drugs  Their current medications and supplements  The patient's functional ability including ADLs,fall risks, home safety risks, cognitive, and hearing and visual impairment  Diet and physical activities  Evidence for depression or mood  disorders  The patient's weight, height, BMI, and visual acuity have been recorded in the chart. I have made referrals, counseling, and provided education to the patient based on review of the above and I have provided the patient with a written personalized care plan for preventive services.

## 2014-12-28 LAB — HM DIABETES EYE EXAM

## 2015-05-12 ENCOUNTER — Other Ambulatory Visit: Payer: Self-pay | Admitting: Gastroenterology

## 2015-05-24 ENCOUNTER — Other Ambulatory Visit: Payer: Self-pay | Admitting: Internal Medicine

## 2015-05-24 ENCOUNTER — Other Ambulatory Visit: Payer: Medicare Other | Admitting: Internal Medicine

## 2015-05-24 DIAGNOSIS — Z79899 Other long term (current) drug therapy: Secondary | ICD-10-CM

## 2015-05-24 DIAGNOSIS — E039 Hypothyroidism, unspecified: Secondary | ICD-10-CM

## 2015-05-24 DIAGNOSIS — E785 Hyperlipidemia, unspecified: Secondary | ICD-10-CM

## 2015-05-24 DIAGNOSIS — E119 Type 2 diabetes mellitus without complications: Secondary | ICD-10-CM

## 2015-05-24 LAB — HEMOGLOBIN A1C
Hgb A1c MFr Bld: 5.9 % — ABNORMAL HIGH (ref <5.7)
Mean Plasma Glucose: 123 mg/dL

## 2015-05-24 LAB — COMPLETE METABOLIC PANEL WITH GFR
ALBUMIN: 4.5 g/dL (ref 3.6–5.1)
ALT: 21 U/L (ref 6–29)
AST: 26 U/L (ref 10–35)
Alkaline Phosphatase: 55 U/L (ref 33–130)
BILIRUBIN TOTAL: 0.7 mg/dL (ref 0.2–1.2)
BUN: 25 mg/dL (ref 7–25)
CO2: 26 mmol/L (ref 20–31)
Calcium: 9.6 mg/dL (ref 8.6–10.4)
Chloride: 103 mmol/L (ref 98–110)
Creat: 1.14 mg/dL — ABNORMAL HIGH (ref 0.60–0.93)
GFR, EST AFRICAN AMERICAN: 55 mL/min — AB (ref >=60)
GFR, EST NON AFRICAN AMERICAN: 48 mL/min — AB (ref >=60)
GLUCOSE: 100 mg/dL — AB (ref 65–99)
Potassium: 4.8 mmol/L (ref 3.5–5.3)
Sodium: 138 mmol/L (ref 135–146)
TOTAL PROTEIN: 7 g/dL (ref 6.1–8.1)

## 2015-05-24 LAB — TSH: TSH: 2.82 mIU/L

## 2015-05-24 LAB — LIPID PANEL
CHOL/HDL RATIO: 3.1 ratio (ref <=5.0)
CHOLESTEROL: 200 mg/dL (ref 125–200)
HDL: 64 mg/dL (ref >=46)
LDL Cholesterol: 112 mg/dL (ref <130)
TRIGLYCERIDES: 120 mg/dL (ref <150)
VLDL: 24 mg/dL (ref <30)

## 2015-05-26 ENCOUNTER — Ambulatory Visit (INDEPENDENT_AMBULATORY_CARE_PROVIDER_SITE_OTHER): Payer: Medicare Other | Admitting: Internal Medicine

## 2015-05-26 ENCOUNTER — Ambulatory Visit
Admission: RE | Admit: 2015-05-26 | Discharge: 2015-05-26 | Disposition: A | Discharge status: Home / Self Care | Payer: Medicare Other | Source: Ambulatory Visit | Attending: Internal Medicine | Admitting: Internal Medicine

## 2015-05-26 ENCOUNTER — Encounter: Payer: Self-pay | Admitting: Internal Medicine

## 2015-05-26 VITALS — BP 134/72 | HR 66 | Temp 98.4°F | Resp 18 | Wt 178.0 lb

## 2015-05-26 DIAGNOSIS — M79671 Pain in right foot: Secondary | ICD-10-CM

## 2015-05-26 DIAGNOSIS — E8881 Metabolic syndrome: Secondary | ICD-10-CM | POA: Diagnosis not present

## 2015-05-26 DIAGNOSIS — R7302 Impaired glucose tolerance (oral): Secondary | ICD-10-CM | POA: Diagnosis not present

## 2015-05-26 DIAGNOSIS — E039 Hypothyroidism, unspecified: Secondary | ICD-10-CM

## 2015-05-26 DIAGNOSIS — M19042 Primary osteoarthritis, left hand: Secondary | ICD-10-CM | POA: Diagnosis not present

## 2015-05-26 DIAGNOSIS — E669 Obesity, unspecified: Secondary | ICD-10-CM

## 2015-05-26 DIAGNOSIS — E785 Hyperlipidemia, unspecified: Secondary | ICD-10-CM | POA: Diagnosis not present

## 2015-05-26 DIAGNOSIS — G4700 Insomnia, unspecified: Secondary | ICD-10-CM

## 2015-05-26 DIAGNOSIS — I10 Essential (primary) hypertension: Secondary | ICD-10-CM

## 2015-05-26 DIAGNOSIS — M19041 Primary osteoarthritis, right hand: Secondary | ICD-10-CM

## 2015-05-26 DIAGNOSIS — N182 Chronic kidney disease, stage 2 (mild): Secondary | ICD-10-CM | POA: Diagnosis not present

## 2015-05-26 MED ORDER — ZOLPIDEM TARTRATE 5 MG PO TABS: 5.0000 mg | ORAL_TABLET | Freq: Every evening | ORAL | Status: DC | PRN

## 2015-05-26 NOTE — Patient Instructions (Addendum)
Have x-ray of right foot. May add Tylenol to Voltaren. Return in 6 months for physical exam. Continue diet and exercise and signed medications. Ambien refilled. Check uric acid.

## 2015-05-26 NOTE — Progress Notes (Signed)
   Subjective:    Patient ID: Sharon Peters, female    DOB: 04-Mar-1940, 75 y.o.   MRN: [id number]  HPI 75 year old Female for 6 month recheck. Has creatinine 1.14 which will be followed. Hgb AIC has improved to 5.9%. Blood pressure is stable on current regimen. TSH is normal on thyroid replacement. Lipid panel normal.Patient has elevated serum creatinine 1.14 but it is improved over last visit of 1.22. We'll continue to follow. Explained to her she has mild chronic kidney disease from hypertension and impaired glucose tolerance. She is also on Voltaren. Has severe osteoarthritis in her hands and sees rheumatologist. They suggested she try taking more Tylenol than Voltaren. She has been having pain in her right foot medial aspect along proximal first metatarsal. No known injury. Her weight is down 1 pound from last visit. Lipid panel normal. She takes fish oil.     Review of Systems  Complain of right foot pain tender      Objective:   Physical Exam She has point tenderness proximal right first metatarsal with prominence of that area. She will have x-ray. Full range of motion in foot. Says it hurts when when her left foot strikes it at night in the be added. Hurts to walk some. She's been walking anyway. Severe osteoarthritis of hands. Says she has been told she does not have rheumatoid arthritis. Chest clear. Neck is supple without thyromegaly JVD or carotid bruits. Cardiac exam regular rate and rhythm normal S1 and S2. Extremities without edema.       Assessment & Plan:  Osteoarthritis both hands followed by rheumatologist  Essential hypertension-stable on current regimen  Hypothyroidism-stable on thyroid replacement  Impaired glucose tolerance-hemoglobin A1c improved 5.9%-stable on metformin  Right foot pain-have x-ray and check uric acid  Insomnia-Ambien refilled  Metabolic syndrome  Obesity  Chronic kidney disease stage II  Plan: Ambien refilled. Continue diet and exercise  regimen. Return in 6 months for physical exam. Have right foot x-rayed. Add Tylenol to Voltaren. Continue to monitor kidney functions every 6 months.

## 2015-05-28 LAB — URIC ACID: URIC ACID, SERUM: 6.9 mg/dL (ref 2.4–7.0)

## 2015-10-03 ENCOUNTER — Other Ambulatory Visit: Payer: Self-pay | Admitting: *Deleted

## 2015-10-03 MED ORDER — LISINOPRIL 10 MG PO TABS: 10.0000 mg | ORAL_TABLET | Freq: Every day | ORAL | 2 refills | Status: DC

## 2015-10-03 MED ORDER — METFORMIN HCL 500 MG PO TABS: 500.0000 mg | ORAL_TABLET | Freq: Every day | ORAL | 2 refills | Status: DC

## 2015-10-03 MED ORDER — HYDROCHLOROTHIAZIDE 25 MG PO TABS: 25.0000 mg | ORAL_TABLET | Freq: Every day | ORAL | 2 refills | Status: DC

## 2015-10-31 ENCOUNTER — Telehealth: Payer: Self-pay | Admitting: Internal Medicine

## 2015-10-31 NOTE — Telephone Encounter (Signed)
Having problems with constipation.  Not having regular bowel movements.  Has taken probiotics, stool softeners.  Has gained 9 pounds over the last 4 months.  States that she feels like she's about 7 months pregnant.   Hasn't seen any blood in her stools although they do seem a little darker than normal.  She'll sometimes go 3-4 days without having a BM.  Does state they are not BLACK or tarry.  States she did try changing her diet and adding more green, leafy vegetables to help the process.  Wants to know if there's anything she can start on regularly or if you think she needs to be referred somewhere in this regard?  Please advise.    Best #:  [phone]

## 2015-10-31 NOTE — Telephone Encounter (Signed)
Spoke with patient and advised of Dr. Verlene Mayer instructions.  Patient verbalized understanding of these instructions.

## 2015-10-31 NOTE — Telephone Encounter (Signed)
She should take Magnesium citrate over ice and get cleaned out. Then start Miralax daily OTC as directed. May not go every day but should go at least every other day. Try this . May continue Miralax indefinitely

## 2015-11-25 ENCOUNTER — Other Ambulatory Visit: Payer: Medicare Other | Admitting: Internal Medicine

## 2015-11-25 DIAGNOSIS — N182 Chronic kidney disease, stage 2 (mild): Secondary | ICD-10-CM

## 2015-11-25 DIAGNOSIS — I10 Essential (primary) hypertension: Secondary | ICD-10-CM

## 2015-11-25 DIAGNOSIS — E119 Type 2 diabetes mellitus without complications: Secondary | ICD-10-CM

## 2015-11-25 DIAGNOSIS — E785 Hyperlipidemia, unspecified: Secondary | ICD-10-CM

## 2015-11-25 DIAGNOSIS — E039 Hypothyroidism, unspecified: Secondary | ICD-10-CM

## 2015-11-25 LAB — CBC WITH DIFFERENTIAL/PLATELET
BASOS ABS: 0 {cells}/uL (ref 0–200)
Basophils Relative: 0 %
Eosinophils Absolute: 138 cells/uL (ref 15–500)
Eosinophils Relative: 3 %
HEMATOCRIT: 35.2 % (ref 35.0–45.0)
HEMOGLOBIN: 11.6 g/dL — AB (ref 11.7–15.5)
LYMPHS ABS: 1472 {cells}/uL (ref 850–3900)
Lymphocytes Relative: 32 %
MCH: 30.1 pg (ref 27.0–33.0)
MCHC: 33 g/dL (ref 32.0–36.0)
MCV: 91.4 fL (ref 80.0–100.0)
MONO ABS: 506 {cells}/uL (ref 200–950)
MPV: 10 fL (ref 7.5–12.5)
Monocytes Relative: 11 %
NEUTROS PCT: 54 %
Neutro Abs: 2484 cells/uL (ref 1500–7800)
Platelets: 226 10*3/uL (ref 140–400)
RBC: 3.85 MIL/uL (ref 3.80–5.10)
RDW: 13.5 % (ref 11.0–15.0)
WBC: 4.6 10*3/uL (ref 3.8–10.8)

## 2015-11-25 LAB — COMPLETE METABOLIC PANEL WITH GFR
ALBUMIN: 4.5 g/dL (ref 3.6–5.1)
ALK PHOS: 51 U/L (ref 33–130)
ALT: 17 U/L (ref 6–29)
AST: 21 U/L (ref 10–35)
BUN: 22 mg/dL (ref 7–25)
CALCIUM: 9.9 mg/dL (ref 8.6–10.4)
CO2: 26 mmol/L (ref 20–31)
Chloride: 99 mmol/L (ref 98–110)
Creat: 1.06 mg/dL — ABNORMAL HIGH (ref 0.60–0.93)
GFR, EST AFRICAN AMERICAN: 60 mL/min (ref >=60)
GFR, EST NON AFRICAN AMERICAN: 52 mL/min — AB (ref >=60)
Glucose, Bld: 101 mg/dL — ABNORMAL HIGH (ref 65–99)
POTASSIUM: 4.8 mmol/L (ref 3.5–5.3)
Sodium: 134 mmol/L — ABNORMAL LOW (ref 135–146)
Total Bilirubin: 0.7 mg/dL (ref 0.2–1.2)
Total Protein: 7.1 g/dL (ref 6.1–8.1)

## 2015-11-25 LAB — LIPID PANEL
CHOL/HDL RATIO: 3.4 ratio (ref <5.0)
CHOLESTEROL: 178 mg/dL (ref <200)
HDL: 53 mg/dL (ref >50)
LDL Cholesterol: 89 mg/dL (ref <100)
Triglycerides: 178 mg/dL — ABNORMAL HIGH (ref <150)
VLDL: 36 mg/dL — ABNORMAL HIGH (ref <30)

## 2015-11-25 LAB — TSH: TSH: 1.7 mIU/L

## 2015-11-26 LAB — VITAMIN D 25 HYDROXY (VIT D DEFICIENCY, FRACTURES): VIT D 25 HYDROXY: 24 ng/mL — AB (ref 30–100)

## 2015-11-26 LAB — HEMOGLOBIN A1C
Hgb A1c MFr Bld: 5.8 % — ABNORMAL HIGH (ref <5.7)
Mean Plasma Glucose: 120 mg/dL

## 2015-11-28 ENCOUNTER — Encounter: Payer: Self-pay | Admitting: Internal Medicine

## 2015-11-28 ENCOUNTER — Ambulatory Visit
Admission: RE | Admit: 2015-11-28 | Discharge: 2015-11-28 | Disposition: A | Discharge status: Home / Self Care | Payer: Medicare Other | Source: Ambulatory Visit | Attending: Internal Medicine | Admitting: Internal Medicine

## 2015-11-28 ENCOUNTER — Ambulatory Visit (INDEPENDENT_AMBULATORY_CARE_PROVIDER_SITE_OTHER): Payer: Medicare Other | Admitting: Internal Medicine

## 2015-11-28 VITALS — BP 134/78 | HR 76 | Temp 97.1°F | Ht 65.0 in | Wt 187.0 lb

## 2015-11-28 DIAGNOSIS — M19042 Primary osteoarthritis, left hand: Secondary | ICD-10-CM | POA: Diagnosis not present

## 2015-11-28 DIAGNOSIS — K5900 Constipation, unspecified: Secondary | ICD-10-CM | POA: Diagnosis not present

## 2015-11-28 DIAGNOSIS — M19041 Primary osteoarthritis, right hand: Secondary | ICD-10-CM | POA: Diagnosis not present

## 2015-11-28 DIAGNOSIS — R7989 Other specified abnormal findings of blood chemistry: Secondary | ICD-10-CM

## 2015-11-28 DIAGNOSIS — Z6831 Body mass index (BMI) 31.0-31.9, adult: Secondary | ICD-10-CM | POA: Diagnosis not present

## 2015-11-28 DIAGNOSIS — R7302 Impaired glucose tolerance (oral): Secondary | ICD-10-CM

## 2015-11-28 DIAGNOSIS — Z0000 Encounter for general adult medical examination without abnormal findings: Secondary | ICD-10-CM | POA: Diagnosis not present

## 2015-11-28 DIAGNOSIS — Z23 Encounter for immunization: Secondary | ICD-10-CM

## 2015-11-28 DIAGNOSIS — E782 Mixed hyperlipidemia: Secondary | ICD-10-CM | POA: Diagnosis not present

## 2015-11-28 DIAGNOSIS — I10 Essential (primary) hypertension: Secondary | ICD-10-CM

## 2015-11-28 LAB — POCT URINALYSIS DIPSTICK
BILIRUBIN UA: NEGATIVE
Blood, UA: NEGATIVE
GLUCOSE UA: NEGATIVE
Ketones, UA: NEGATIVE
Leukocytes, UA: NEGATIVE
Nitrite, UA: NEGATIVE
Protein, UA: NEGATIVE
SPEC GRAV UA: 1.01
UROBILINOGEN UA: NEGATIVE
pH, UA: 5

## 2015-11-28 NOTE — Progress Notes (Deleted)
   Subjective:    Patient ID: Sharon Peters, female    DOB: 02-02-1940, 75 y.o.   MRN: [id number]  HPI     Review of Systems     Objective:   Physical Exam        Assessment & Plan:

## 2015-11-28 NOTE — Progress Notes (Signed)
Subjective:    Patient ID: Sharon Peters, female    DOB: 1940/02/22, 75 y.o.   MRN: [id number]  HPI Pt called recently about constipation and was told to take Miralax. Also has had to take magnesium sulfate for relief every week or so. Feels isues started around time of colonoscopy. No prior history of frequent constipation. No new meds. Says she is not worried about anything.Some relief consuming coffee. Complains decreased  caliber of stool and amount. Complains of weight gain. Had colonoscopy by Dr. Cristina Gong April 2017 which was normal except for hyperplastic polyp.  She's here today for physical exam and evaluation of medical issues which include hypothyroidism, metabolic syndrome osteoarthritis of hands, history of left ventricular diastolic dysfunction, controlled type 2 diabetes, hypertension and hyperlipidemia.  In 2010 was diagnosed with diabetes mellitus and hypothyroidism was diagnosed in 2000.  Patient is intolerant of Celebrex as it causes her eyes to swell. This would allude to a Sulfa allergy as well.  Past medical history: Left knee arthroscopic surgery around 1987. Tubal ligation around 1979. In 2012 was diagnosed with probable small meniscal tear of the left knee. It was injected with steroids. Has been diagnosed in the past with trochanteric bursitis of the left hip.  Social history: She is divorced and resides alone. She is retired. Does not smoke. Rare alcohol consumption. 2 adult children, a son and a daughter.  Family history: Brother with history of liver cancer secondary to hepatitis B. Father died at age 75 of pancreatic cancer. Mother died at age 29 of lung cancer. 3 brothers and one sister.    Review of Systems main issue today is constipation and osteoarthritis of her hands     Objective:   Physical Exam Skin warm and dry. Nodes none. TMs and pharynx are clear. Neck is supple without JVD thyromegaly or carotid bruits. Chest clear to auscultation. Cardiac exam  regular rate and rhythm normal S1 and S2. Extremities without edema. Significant osteoarthritis of both hands with Heberden's and Bouchard's nodes.       Assessment & Plan:  New onset constipation-etiology unclear. Trial of Amitiza. Will need prior authorization. TSH is normal  Hypothyroidism-see above regarding TSH  Hyperlipidemia-triglycerides elevated at 178  Controlled type 2 diabetes mellitus  Metabolic syndrome  Essential hypertension-creatinine stable at 1.06 and 6 months ago was 1.14  History of left ventricular diastolic dysfunction  Obesity  Vitamin D deficiency-take 2000 units vitamin D 3 daily  Osteoarthritis of both hands  Plan: Trial of Amitiza for constipation. Continue same meds and return in 6 months. Hemoglobin A1c 5.8% and stable.  Subjective:   Patient presents for Medicare Annual/Subsequent preventive examination.  Review Past Medical/Family/Social:See above   Risk Factors  Current exercise habits: Some light exercise Dietary issues discussed: Low fat low carbohydrate  Cardiac risk factors:Hyperlipidemia, hypertension, diabetes mellitus  Depression Screen  (Note: if answer to either of the following is "Yes", a more complete depression screening is indicated)   Over the past two weeks, have you felt down, depressed or hopeless? No  Over the past two weeks, have you felt little interest or pleasure in doing things? No Have you lost interest or pleasure in daily life? No Do you often feel hopeless? No Do you cry easily over simple problems? No   Activities of Daily Living  In your present state of health, do you have any difficulty performing the following activities?:   Driving? No  Managing money? No  Feeding yourself? No  Getting  from bed to chair? No  Climbing a flight of stairs? No  Preparing food and eating?: No  Bathing or showering? No  Getting dressed: No  Getting to the toilet? No  Using the toilet:No  Moving around from place  to place: No  In the past year have you fallen or had a near fall?:No  Are you sexually active? No  Do you have more than one partner? No   Hearing Difficulties: No  Do you often ask people to speak up or repeat themselves? No  Do you experience ringing or noises in your ears? No  Do you have difficulty understanding soft or whispered voices? No  Do you feel that you have a problem with memory? No Do you often misplace items? No    Home Safety:  Do you have a smoke alarm at your residence? Yes Do you have grab bars in the bathroom?Yes Do you have throw rugs in your house? No   Cognitive Testing  Alert? Yes Normal Appearance?Yes  Oriented to person? Yes Place? Yes  Time? Yes  Recall of three objects? Yes  Can perform simple calculations? Yes  Displays appropriate judgment?Yes  Can read the correct time from a watch face?Yes   List the Names of Other Physician/Practitioners you currently use:  See referral list for the physicians patient is currently seeing.     Review of Systems: See above is no   Objective:     General appearance: Appears stated age and  obese  Head: Normocephalic, without obvious abnormality, atraumatic  Eyes: conj clear, EOMi PEERLA  Ears: normal TM's and external ear canals both ears  Nose: Nares normal. Septum midline. Mucosa normal. No drainage or sinus tenderness.  Throat: lips, mucosa, and tongue normal; teeth and gums normal  Neck: no adenopathy, no carotid bruit, no JVD, supple, symmetrical, trachea midline and thyroid not enlarged, symmetric, no tenderness/mass/nodules  No CVA tenderness.  Lungs: clear to auscultation bilaterally  Breasts: normal appearance, no masses or tenderness, .  Heart: regular rate and rhythm, S1, S2 normal, no murmur, click, rub or gallop  Abdomen: soft, non-tender; bowel sounds normal; no masses, no organomegaly  Musculoskeletal: ROM normal in all joints, no crepitus, no deformity, Normal muscle strengthen. Back   is symmetric, no curvature. Skin: Skin color, texture, turgor normal. No rashes or lesions  Lymph nodes: Cervical, supraclavicular, and axillary nodes normal.  Neurologic: CN 2 -12 Normal, Normal symmetric reflexes. Normal coordination and gait  Psych: Alert & Oriented x 3, Mood appear stable.    Assessment:    Annual wellness medicare exam   Plan:    During the course of the visit the patient was educated and counseled about appropriate screening and preventive services including:  Annual mammogram  Annual flu vaccine      Patient Instructions (the written plan) was given to the patient.  Medicare Attestation  I have personally reviewed:  The patient's medical and social history  Their use of alcohol, tobacco or illicit drugs  Their current medications and supplements  The patient's functional ability including ADLs,fall risks, home safety risks, cognitive, and hearing and visual impairment  Diet and physical activities  Evidence for depression or mood disorders  The patient's weight, height, BMI, and visual acuity have been recorded in the chart. I have made referrals, counseling, and provided education to the patient based on review of the above and I have provided the patient with a written personalized care plan for preventive services.

## 2015-11-29 ENCOUNTER — Telehealth: Payer: Self-pay | Admitting: Internal Medicine

## 2015-11-29 MED ORDER — LUBIPROSTONE 8 MCG PO CAPS: 16.0000 ug | ORAL_CAPSULE | Freq: Two times a day (BID) | ORAL | 0 refills | Status: DC

## 2015-11-29 NOTE — Telephone Encounter (Signed)
Advised patient of her stool issue.  Informed her of medication recommendation, amitiza sent to pharmacy.  Patient will follow up in 2 weeks.

## 2015-11-29 NOTE — Telephone Encounter (Signed)
-----   Message from Elby Showers, MD sent at 11/28/2015  5:50 PM EST ----- Please call pt. Has large amount of stool in colon. Try one bottle of Magnesium citrate over ice then Amitiza 16 micrograms twice daily. Follow up in 2 weeks. Stop Miralax.

## 2015-12-06 ENCOUNTER — Telehealth: Payer: Self-pay | Admitting: Internal Medicine

## 2015-12-06 NOTE — Telephone Encounter (Signed)
Advised patient to take one bottle of magnesium citrate today and to call and make an appointment with Dr Cristina Gong.  Patient agreeable.

## 2015-12-06 NOTE — Telephone Encounter (Signed)
Take one bottle of magnesium citrate over ice today. Please call and get her appt with Dr. Cristina Gong. He did her colonoscopy in April and this problem started shortly thereafter.

## 2015-12-06 NOTE — Telephone Encounter (Signed)
Patient states she hasn't been able to have a bowel movement since Saturday.  States that she really isn't having much better success with the Amitiza than before she started the medication.  States that she did go to the bathroom when she took the Magnesium Citrate.  However, she just isn't having BM's on any type of regular basis and it's concerning.  We had previously advised that it may be every other day.  However, this has been 3 days today since she has had a BM.    Should she repeat the Mag Citrate?

## 2015-12-15 NOTE — Patient Instructions (Signed)
Try Amitiza for constipation. Continue same medications and return in 6 months. Call if constipation not resolving

## 2015-12-19 ENCOUNTER — Ambulatory Visit: Payer: Self-pay | Admitting: Internal Medicine

## 2015-12-26 ENCOUNTER — Other Ambulatory Visit: Payer: Self-pay | Admitting: Internal Medicine

## 2016-01-30 ENCOUNTER — Encounter: Payer: Self-pay | Admitting: Internal Medicine

## 2016-01-30 LAB — HM DIABETES EYE EXAM

## 2016-03-19 ENCOUNTER — Other Ambulatory Visit: Payer: Self-pay

## 2016-03-19 MED ORDER — ZOLPIDEM TARTRATE 5 MG PO TABS: 5.0000 mg | ORAL_TABLET | Freq: Every evening | ORAL | 5 refills | Status: DC | PRN

## 2016-04-02 ENCOUNTER — Other Ambulatory Visit: Payer: Self-pay

## 2016-04-02 MED ORDER — HYDROCHLOROTHIAZIDE 25 MG PO TABS: 25.0000 mg | ORAL_TABLET | Freq: Every day | ORAL | 0 refills | Status: DC

## 2016-04-02 MED ORDER — METFORMIN HCL 500 MG PO TABS: 500.0000 mg | ORAL_TABLET | Freq: Every day | ORAL | 0 refills | Status: DC

## 2016-04-02 MED ORDER — LISINOPRIL 10 MG PO TABS: 10.0000 mg | ORAL_TABLET | Freq: Every day | ORAL | 0 refills | Status: DC

## 2016-05-29 ENCOUNTER — Other Ambulatory Visit: Payer: Self-pay | Admitting: Internal Medicine

## 2016-05-29 ENCOUNTER — Other Ambulatory Visit: Payer: Medicare Other | Admitting: Internal Medicine

## 2016-05-29 DIAGNOSIS — R7302 Impaired glucose tolerance (oral): Secondary | ICD-10-CM

## 2016-05-29 DIAGNOSIS — E039 Hypothyroidism, unspecified: Secondary | ICD-10-CM

## 2016-05-29 LAB — TSH: TSH: 2.96 mIU/L

## 2016-05-30 LAB — MICROALBUMIN / CREATININE URINE RATIO
CREATININE, URINE: 159 mg/dL (ref 20–320)
Microalb Creat Ratio: 3 mcg/mg creat (ref <30)
Microalb, Ur: 0.5 mg/dL

## 2016-05-30 LAB — HEMOGLOBIN A1C
Hgb A1c MFr Bld: 6.1 % — ABNORMAL HIGH (ref <5.7)
Mean Plasma Glucose: 128 mg/dL

## 2016-05-31 ENCOUNTER — Ambulatory Visit (INDEPENDENT_AMBULATORY_CARE_PROVIDER_SITE_OTHER): Payer: Medicare Other | Admitting: Internal Medicine

## 2016-05-31 ENCOUNTER — Encounter: Payer: Self-pay | Admitting: Internal Medicine

## 2016-05-31 VITALS — BP 126/70 | HR 76 | Temp 97.3°F | Ht 66.0 in | Wt 184.0 lb

## 2016-05-31 DIAGNOSIS — E782 Mixed hyperlipidemia: Secondary | ICD-10-CM

## 2016-05-31 DIAGNOSIS — M19042 Primary osteoarthritis, left hand: Secondary | ICD-10-CM | POA: Diagnosis not present

## 2016-05-31 DIAGNOSIS — M19041 Primary osteoarthritis, right hand: Secondary | ICD-10-CM

## 2016-05-31 DIAGNOSIS — K5900 Constipation, unspecified: Secondary | ICD-10-CM | POA: Diagnosis not present

## 2016-05-31 DIAGNOSIS — I10 Essential (primary) hypertension: Secondary | ICD-10-CM | POA: Diagnosis not present

## 2016-05-31 DIAGNOSIS — R7302 Impaired glucose tolerance (oral): Secondary | ICD-10-CM | POA: Diagnosis not present

## 2016-05-31 DIAGNOSIS — R7989 Other specified abnormal findings of blood chemistry: Secondary | ICD-10-CM

## 2016-05-31 DIAGNOSIS — E8881 Metabolic syndrome: Secondary | ICD-10-CM | POA: Diagnosis not present

## 2016-05-31 LAB — BASIC METABOLIC PANEL
BUN: 30 mg/dL — ABNORMAL HIGH (ref 7–25)
CALCIUM: 9.5 mg/dL (ref 8.6–10.4)
CO2: 23 mmol/L (ref 20–31)
CREATININE: 1.23 mg/dL — AB (ref 0.60–0.93)
Chloride: 105 mmol/L (ref 98–110)
GLUCOSE: 111 mg/dL — AB (ref 65–99)
Potassium: 4.5 mmol/L (ref 3.5–5.3)
Sodium: 137 mmol/L (ref 135–146)

## 2016-05-31 MED ORDER — ZOLPIDEM TARTRATE 5 MG PO TABS: 5.0000 mg | ORAL_TABLET | Freq: Every evening | ORAL | 5 refills | Status: DC | PRN

## 2016-05-31 NOTE — Patient Instructions (Addendum)
Continue diet exercise and weight loss. Ambien refilled. Continue same medications. Basic metabolic panel pending.  Addendum: Follow-up next week for discussion of elevated creatinine

## 2016-05-31 NOTE — Progress Notes (Signed)
   Subjective:    Patient ID: Sharon Peters, female    DOB: 07-Nov-1940, 76 y.o.   MRN: [id number]  HPI  76 year old Female for 6 month recheck. Not sleeping well. Needs to see if can pay out of pocket. Can try melatonin if can't afford Ambien. Rx provided for Ambien.  With regard to constipation issues, Dr. Cristina Gong placed her on Linzess and subsequently MiraLAX. Suddenly over the past few weeks she started having regular bowel movements on her own. I told her not to stop taking the MiraLAX. She needs to continue this.  She is asking about shingles vaccine. She's had the previous shingles vaccine and needs to consider the new one. She needs to call insurance company to see if it will be pain. It offers about a 10% more efficacy.  At last visit she had an elevated serum creatinine of 1.06 and a year ago 1.14. She is on Voltaren for chronic musculoskeletal pain by rheumatologist. She is also on lisinopril. We will add b-met to labs already drawn.  Her hemoglobin A1c has increased to 6.1%. Has gained some weight. Not dieting and exercising as she should. Reminded her about this.    Review of Systems  Respiratory: Negative.   Cardiovascular: Negative.   Gastrointestinal:       Constipation resolved.   Genitourinary: Negative.   Musculoskeletal:       Joint pain treated with diclofenac  Psychiatric/Behavioral: Negative.        Objective:   Physical Exam  Constitutional: She appears well-developed and well-nourished. No distress.  Neck: Neck supple. No thyromegaly present.  Cardiovascular: Normal rate, regular rhythm and normal heart sounds.   Pulmonary/Chest: Effort normal and breath sounds normal. No respiratory distress. She has no wheezes. She has no rales.  Musculoskeletal: She exhibits no edema.  Lymphadenopathy:    She has no cervical adenopathy.  Skin: No rash noted. She is not diaphoretic.  Psychiatric: She has a normal mood and affect. Her behavior is normal. Judgment and  thought content normal.  Vitals reviewed.         Assessment & Plan:  History of elevated serum creatinine. Check B-met  Insomnia-may pay for Ambien out of pocket since insurance currently did not approve.  Obesity-days to work on diet exercise and weight loss  Essential hypertension  Hypothyroidism  Hyperlipidemia  Impaired glucose tolerance  Elevated serum creatinine  History of constipation  Osteoarthritis treated with Voltaren  Plan: Work on diet exercise and weight loss. Basic metabolic panel to be added to recent labs. Refill Ambien by written prescription. Continue MiraLAX.  Addendum: She has elevated serum creatinine and will follow-up next week for discussion

## 2016-06-04 ENCOUNTER — Ambulatory Visit (INDEPENDENT_AMBULATORY_CARE_PROVIDER_SITE_OTHER): Payer: Medicare Other | Admitting: Internal Medicine

## 2016-06-04 ENCOUNTER — Encounter: Payer: Self-pay | Admitting: Internal Medicine

## 2016-06-04 VITALS — BP 118/62 | HR 79 | Temp 97.0°F | Ht 66.0 in | Wt 184.0 lb

## 2016-06-04 DIAGNOSIS — R7302 Impaired glucose tolerance (oral): Secondary | ICD-10-CM

## 2016-06-04 DIAGNOSIS — I10 Essential (primary) hypertension: Secondary | ICD-10-CM

## 2016-06-04 MED ORDER — AMLODIPINE BESYLATE 5 MG PO TABS: 5.0000 mg | ORAL_TABLET | Freq: Every day | ORAL | 0 refills | Status: DC

## 2016-06-04 NOTE — Patient Instructions (Signed)
Discontinue lisinopril. Amlodipine 5 mg daily in addition to HCTZ and return in 3.5 weeks

## 2016-06-04 NOTE — Progress Notes (Signed)
   Subjective:    Patient ID: Sharon Peters, female    DOB: 1940/06/07, 76 y.o.   MRN: [id number]  HPI  76 year old White Female for follow-up of chronic kidney disease. At last visit she had an elevated serum creatinine of 1.23 and in November her creatinine had been 1.06. In October 2016 her creatinine was 1.22. In May 2017 creatinine was 1.14.  Patient says she has gained some weight over the past few months. Talked with her about the importance of diet exercise and keeping blood pressure under good control.  She is on lisinopril 10 mg daily. We are going to stop that and see if creatinine will improve. In place of lisinopril and prescribing amlodipine 5 mg daily and she will continue with HCTZ.  She says she's had hypertension issues for number of years but cannot be specific about it perhaps 10 or 15 years. She has a history of diabetes mellitus treated with metformin. She had a normal 2-D echo in 2014. History of small pericardial effusion in the past followed by Dr. Ron Parker. Ejection fraction in 2010 showed persistent trivial pericardial effusion with normal ejection fraction of 65%. Sedimentation rate an ANA were normal in the past. Reported history of grade 1 diastolic dysfunction according to old records.  She sees rheumatologist for osteoarthritis and takes Voltaren. Has been on that for about a year. It one point when her creatinine was elevated in 2017 and recommended she take more Tylenol than Voltaren.  She's having some issues with her right second toe hallux deformity and will be referred to [facility].    Review of Systems see above     Objective:   Physical Exam  Spent 20 minutes speaking with her today about these issues. We're going to discontinue lisinopril and try amlodipine. She'll return in about 3-1/2 weeks for follow-up. She is on diclofenac for osteoarthritis of her hands.      Assessment & Plan:  Elevated serum creatinine-? Chronic kidney disease stage  III  Essential hypertension  Diabetes mellitus  Hallux deformity right second toe-to see podiatrist  Plan: Discontinue lisinopril and start amlodipine 5 mg daily continue HCTZ. Follow-up here in 3-1/2 weeks with basic metabolic panel followed by office visit. Call if blood pressure not well controlled on this regimen. Watch diet and get some exercise and lose some weight. Do not discontinue Voltaren at the present time.

## 2016-06-25 ENCOUNTER — Other Ambulatory Visit: Payer: Medicare Other | Admitting: Internal Medicine

## 2016-06-25 DIAGNOSIS — I10 Essential (primary) hypertension: Secondary | ICD-10-CM

## 2016-06-25 DIAGNOSIS — R7989 Other specified abnormal findings of blood chemistry: Secondary | ICD-10-CM

## 2016-06-25 LAB — BASIC METABOLIC PANEL
BUN: 25 mg/dL (ref 7–25)
CALCIUM: 9.4 mg/dL (ref 8.6–10.4)
CO2: 26 mmol/L (ref 20–31)
Chloride: 101 mmol/L (ref 98–110)
Creat: 1.31 mg/dL — ABNORMAL HIGH (ref 0.60–0.93)
GLUCOSE: 102 mg/dL — AB (ref 65–99)
Potassium: 4.5 mmol/L (ref 3.5–5.3)
SODIUM: 138 mmol/L (ref 135–146)

## 2016-06-26 ENCOUNTER — Encounter: Payer: Self-pay | Admitting: Internal Medicine

## 2016-06-26 ENCOUNTER — Ambulatory Visit (INDEPENDENT_AMBULATORY_CARE_PROVIDER_SITE_OTHER): Payer: Medicare Other | Admitting: Internal Medicine

## 2016-06-26 VITALS — BP 118/60 | HR 68 | Temp 97.5°F | Wt 181.0 lb

## 2016-06-26 DIAGNOSIS — I10 Essential (primary) hypertension: Secondary | ICD-10-CM

## 2016-06-26 DIAGNOSIS — E8881 Metabolic syndrome: Secondary | ICD-10-CM | POA: Diagnosis not present

## 2016-06-26 DIAGNOSIS — M19042 Primary osteoarthritis, left hand: Secondary | ICD-10-CM | POA: Diagnosis not present

## 2016-06-26 DIAGNOSIS — R7989 Other specified abnormal findings of blood chemistry: Secondary | ICD-10-CM | POA: Diagnosis not present

## 2016-06-26 DIAGNOSIS — R7302 Impaired glucose tolerance (oral): Secondary | ICD-10-CM

## 2016-06-26 DIAGNOSIS — I5189 Other ill-defined heart diseases: Secondary | ICD-10-CM

## 2016-06-26 DIAGNOSIS — I519 Heart disease, unspecified: Secondary | ICD-10-CM

## 2016-06-26 DIAGNOSIS — M19041 Primary osteoarthritis, right hand: Secondary | ICD-10-CM

## 2016-06-26 DIAGNOSIS — E782 Mixed hyperlipidemia: Secondary | ICD-10-CM

## 2016-06-26 LAB — CREATININE, SERUM: CREATININE: 1.25 mg/dL — AB (ref 0.60–0.93)

## 2016-06-26 MED ORDER — TRAMADOL HCL 50 MG PO TABS: 50.0000 mg | ORAL_TABLET | Freq: Three times a day (TID) | ORAL | 0 refills | Status: DC | PRN

## 2016-06-26 NOTE — Patient Instructions (Signed)
Discontinue Voltaren. Try tramadol for pain. Have ultrasound of kidneys. Continue HCTZ and amlodipine. Continue to stay off of losartan. Appointment with nephrology.

## 2016-06-26 NOTE — Progress Notes (Signed)
   Subjective:    Patient ID: Sharon Peters, female    DOB: 08/09/1940, 76 y.o.   MRN: [id number]  HPI 76 year old Female with elevated serum creatinine Returns for evaluation. In November 2017, her creatinine had been 1.06. More recently it was 1.23. In October 2016 her creatinine was 1.22 and in May 2017 creatinine was 1.14. At last visit we stopped losartan. She continue with HCTZ and I started her on amlodipine 5 mg daily. Her blood pressure is excellent today. She's tolerating amlodipine fine. Each time these other creatinines were drawn she was fasting. Today she is not fasting and I'm going to repeat her serum creatinine.  We talked at length. She is on Voltaren for osteoarthritis. Were going to stop that and take tramadol in place of that.  She needs an ultrasound of her kidneys. I am going to refer her to [facility] for evaluation. She's quite concerned about her creatinine.    Review of Systems     Objective:   Physical Exam Not examined. Spent 15 minutes speaking with her about these issues. Serum creatinine drawn today.       Assessment & Plan:  Elevated serum creatinine  Suspect chronic kidney disease  Essential hypertension  Diastolic dysfunction  Hyperlipidemia  Hypothyroidism  Impaired glucose tolerance  Osteoarthritis of both pain hands  Plan: Await serum creatinine results today when well hydrated. Appointment with [facility]. Ultrasound of kidneys ordered. Continue amlodipine and HCTZ for now. Discontinue Voltaren and try tramadol instead for pain

## 2016-06-28 ENCOUNTER — Ambulatory Visit
Admission: RE | Admit: 2016-06-28 | Discharge: 2016-06-28 | Disposition: A | Discharge status: Home / Self Care | Payer: Medicare Other | Source: Ambulatory Visit | Attending: Internal Medicine | Admitting: Internal Medicine

## 2016-06-28 ENCOUNTER — Other Ambulatory Visit: Payer: Medicare Other

## 2016-06-28 ENCOUNTER — Telehealth: Payer: Self-pay | Admitting: Internal Medicine

## 2016-06-28 NOTE — Telephone Encounter (Signed)
No, I told her this was the usual procedure and she can wait.

## 2016-06-28 NOTE — Telephone Encounter (Signed)
Pt is aware.  

## 2016-06-28 NOTE — Telephone Encounter (Signed)
Patient will be going today at 1:00 for her Renal Ultrasound.  Her company left earlier than expected.  She called [facility] back and they still had the appointment available.  So, she'll be going today after all.  Advised that we have sent the referral over to [facility].  We will fax these results to [facility] as well.    Patient is concerned that she will have to wait 2-4 months for an appointment with [facility] and her Creatinine is already elevated.  Should she be concerned in this regard?  Is there anything that she should be doing in the meantime while she is waiting for the appointment with them?

## 2016-07-02 ENCOUNTER — Other Ambulatory Visit: Payer: Self-pay

## 2016-07-02 MED ORDER — AMLODIPINE BESYLATE 5 MG PO TABS: 5.0000 mg | ORAL_TABLET | Freq: Every day | ORAL | 0 refills | Status: DC

## 2016-07-05 ENCOUNTER — Other Ambulatory Visit: Payer: Medicare Other

## 2016-07-09 ENCOUNTER — Other Ambulatory Visit: Payer: Self-pay

## 2016-07-09 MED ORDER — AMLODIPINE BESYLATE 5 MG PO TABS: 5.0000 mg | ORAL_TABLET | Freq: Every day | ORAL | 3 refills | Status: DC

## 2016-07-23 ENCOUNTER — Ambulatory Visit: Payer: Medicare Other | Admitting: Internal Medicine

## 2016-07-23 ENCOUNTER — Telehealth: Payer: Self-pay

## 2016-07-23 NOTE — Telephone Encounter (Signed)
Received fax from [place] in regards to a refill on Tramadol 50mg  for patient. She stated it is for her arthritis and she is going out of town to [place]. She is almost out because she states she was only given 60 tablets and some days she took more then prescribed because of pain. Last refill was 06/26/16 Please advise.

## 2016-07-23 NOTE — Telephone Encounter (Signed)
LVM that appt was made for today at 245 pm but to call back if 415 pm is better.

## 2016-07-23 NOTE — Telephone Encounter (Signed)
Called one last time to confirm, no answer left message

## 2016-07-23 NOTE — Telephone Encounter (Signed)
Patient called back and said she cannot make the appt she made today.  She will be leaving for [place] tomorrow morning and can see Dr. Renold Genta before than if possible.  If not she will use Tylenol extra strength while she is gone and see Dr. Renold Genta when she returns.

## 2016-07-23 NOTE — Telephone Encounter (Signed)
LM that appt was made and wanted to inform her no refills will be given until she comes in for an appt.

## 2016-07-23 NOTE — Telephone Encounter (Signed)
Have patient come in this afternoon to discuss pain management

## 2016-07-23 NOTE — Telephone Encounter (Signed)
Pt states that she can not come in before her trip. She said she will call after the 20th when she is back home and make an appointment to come in at that time.

## 2016-08-07 ENCOUNTER — Ambulatory Visit (INDEPENDENT_AMBULATORY_CARE_PROVIDER_SITE_OTHER): Payer: Medicare Other | Admitting: Internal Medicine

## 2016-08-07 ENCOUNTER — Encounter: Payer: Self-pay | Admitting: Internal Medicine

## 2016-08-07 VITALS — BP 130/70 | HR 72 | Temp 97.7°F | Wt 179.0 lb

## 2016-08-07 DIAGNOSIS — M19041 Primary osteoarthritis, right hand: Secondary | ICD-10-CM | POA: Diagnosis not present

## 2016-08-07 DIAGNOSIS — I10 Essential (primary) hypertension: Secondary | ICD-10-CM

## 2016-08-07 DIAGNOSIS — N183 Chronic kidney disease, stage 3 unspecified: Secondary | ICD-10-CM

## 2016-08-07 DIAGNOSIS — R609 Edema, unspecified: Secondary | ICD-10-CM | POA: Diagnosis not present

## 2016-08-07 DIAGNOSIS — M19042 Primary osteoarthritis, left hand: Secondary | ICD-10-CM

## 2016-08-07 DIAGNOSIS — R7989 Other specified abnormal findings of blood chemistry: Secondary | ICD-10-CM

## 2016-08-07 LAB — BASIC METABOLIC PANEL
BUN: 17 mg/dL (ref 7–25)
CALCIUM: 9.3 mg/dL (ref 8.6–10.4)
CHLORIDE: 99 mmol/L (ref 98–110)
CO2: 26 mmol/L (ref 20–31)
Creat: 1.13 mg/dL — ABNORMAL HIGH (ref 0.60–0.93)
GLUCOSE: 89 mg/dL (ref 65–99)
POTASSIUM: 3.6 mmol/L (ref 3.5–5.3)
SODIUM: 136 mmol/L (ref 135–146)

## 2016-08-07 MED ORDER — TRAMADOL HCL 50 MG PO TABS: 50.0000 mg | ORAL_TABLET | Freq: Three times a day (TID) | ORAL | 0 refills | Status: DC | PRN

## 2016-08-07 NOTE — Progress Notes (Signed)
   Subjective:    Patient ID: Sharon Peters, female    DOB: [birth year], 76 y.o.   MRN: [id number]  HPI   76 year old Female for follow up of elevated creatinine now on amlodipine and off ARB. Has appt August 14th with Dr. Hollie Salk at [facility]. Patient wants creatinine check today now that she's off ARB.  She's been on a trip to [place] to visit family. It was very hot there. While there, she developed dependent edema. She is on HCTZ. It is improving since returning home. The weather is cold or today. Continue to monitor. It is not pitting.  She's out of tramadol. She's been taking it for osteoarthritis in her hands. Says it rheumatologist is told her she possibly could have rheumatoid arthritis. She has considerable ulnar deviation and prominent Heberden's and Bouchard's nodes in both hands. She found tramadol helpful but not as helpful as Voltaren. I think nephrologist will not want her taking Voltaren with chronic kidney disease. Have refilled tramadol for 30 days. Not to exceed 30 a day. She asking about wine consumption with tramadol and I do not want her to drink wine and take tramadol on the same day.  I'm masking her if she could check with rheumatologist to see if she could have fingers injected with steroids. She will ask at next visit.    Review of Systems see above     Objective:   Physical Exam She has nonpitting edema of the lower extremities. It is only trace edema today. Chest clear. Cardiac exam regular rate and rhythm. Prominent Heberden's and Bechard's nodes bilaterally with ulnar deviation of second and third fingers bilaterally.       Assessment & Plan:  Chronic kidney disease stage III-check basic metabolic panel off are back and currently on amlodipine.Reviewed with her ultrasound report. No obstruction noted.  Dependent edema -new issue today.Amlodipine could be aggravating  dependent edema but it doesn't seem to be very severe at this point in time  and we will continue with it for now. Upcoming appointment with nephrologist.  Chronic hand pain due to arthritis-patient ask rheumatologist if her fingers can be injected. She's not certain she wants to have them injected. I do not want her taking Voltaren. She may take sparingly Tylenol and I have refilled her tramadol. Avoid NSAIDs.

## 2016-08-07 NOTE — Patient Instructions (Signed)
Continue HCTZ. Avoid salt and keep feet elevated for dependent edema. See nephrologist in August regarding elevated serum creatinine and chronic kidney disease. Recent ultrasound report reviewed with patient. Tramadol refilled. Avoid NSAIDs

## 2016-09-24 ENCOUNTER — Telehealth: Payer: Self-pay | Admitting: Internal Medicine

## 2016-09-24 ENCOUNTER — Telehealth: Payer: Self-pay

## 2016-09-24 MED ORDER — METFORMIN HCL 500 MG PO TABS: 500.0000 mg | ORAL_TABLET | Freq: Every day | ORAL | 1 refills | Status: DC

## 2016-09-24 MED ORDER — HYDROCHLOROTHIAZIDE 25 MG PO TABS: 25.0000 mg | ORAL_TABLET | Freq: Every day | ORAL | 1 refills | Status: DC

## 2016-09-24 NOTE — Telephone Encounter (Signed)
No, they say the kidney disease was made worse by Voltaren. However we will not refill the losartan. Please mail her a copy of [facility] note.

## 2016-09-24 NOTE — Telephone Encounter (Signed)
Patient called back; she received your voicemail.  She stated that her understanding from before was that the Linsinopril may have been what caused her to have the Stage 3 Kidney Disease.  She's now on Amlodipine 5mg  and she states that she's doing very well on her current regime.  She seems apprehensive to make any changes because she IS doing so well.    She wanted to make sure that you understood that she used to do the Lisinopril and she understood you to say that this could have caused the kidney disease.  Is she correct and what did you want to do from here?    Best number for contact:  [phone]

## 2016-09-24 NOTE — Telephone Encounter (Signed)
Received fax from [place] in regards to a refill on Hydrochlorothiazide 25 mg for patient. Medication was refilled per Dr. Verlene Mayer request. Sent 6 Month supply

## 2016-09-24 NOTE — Telephone Encounter (Signed)
Received fax from [place] in regards to a refill on Metformin for patient. Medication was refilled per Dr. Verlene Mayer request. Sent 6 month supply

## 2016-09-24 NOTE — Telephone Encounter (Signed)
Spoke with patient; advised of Dr. Verlene Mayer message.  Told patient that I would be mailing her a copy of [facility] note.  Highlighted area's of the note where Dr. Hollie Salk makes statements regarding patient stopping Diclofenac (Voltaren).  Patient seems quite concerned about Stage 3 of Kidney Disease and not having a return appointment to the kidney doctor for another year.  Advised she should call their office and speak with them if she has concerns.    She has an appointment here in  November.  Patient has a lot of unanswered questions regarding kidney disease and is quite worried about going forward and the disease progressing.  Says that she will speak with Dr. Renold Genta when she comes in.    Mailing note.

## 2016-09-25 ENCOUNTER — Telehealth: Payer: Self-pay

## 2016-09-25 MED ORDER — LEVOTHYROXINE SODIUM 75 MCG PO TABS: ORAL_TABLET | ORAL | 3 refills | Status: DC

## 2016-09-25 NOTE — Telephone Encounter (Signed)
Received fax from [place] in regards to a refill on Synthroid 19mcg for patient. Medication was refilled per Dr. Verlene Mayer request. Sent 1 year

## 2016-09-30 ENCOUNTER — Telehealth: Payer: Self-pay | Admitting: Internal Medicine

## 2016-09-30 ENCOUNTER — Encounter: Payer: Self-pay | Admitting: Internal Medicine

## 2016-09-30 NOTE — Telephone Encounter (Signed)
Received a refill request from [facility] regarding refill on lisinopril. We had discontinued this in May and placed her on amlodipine.  I called the patient to inquire whether or not she was still taking this medication. She saw Dr. Hollie Salk who did not restart it but indicated it could be restarted if additional blood pressure control was needed depending on her lab work.  Patient says she's had a hard time with this diagnosis and has been quite worried. Says she's lost some weight but she's not sure why. Says she would like to consider seeing a dietitian to see what she should eat regarding chronic kidney disease. I reminded her the Dr. Hollie Salk had given her a list of things to avoid. She should follow a low-salt diet and watch her potassium although she is nowhere near dialysis and should not be on a protein restricted diet at this point in time I do not believe. Explained to her that she had a long way to go before considering dialysis and most likely could remain stable at stage III kidney disease. She says that she had noticed her creatinine was going up along the way but I indicated to her that her creatinine was fluctuating and had not continued to be persistently elevated until this past spring and summer. Also explained to her that years of high blood pressure and impaired glucose tolerance/diabetes contributed to this. She was concerned about the need for possible biopsy but Dr. Hollie Salk did not want to do a biopsy. Explained to her that Dr. Hollie Salk did not suspect she had need for this at this point in time. I spent about 20 minutes on the phone with her today. We have agreed that she will come in to get more lab work including CBC with differential TSH C met with GFR. She wants to get flu vaccine as well. She mentioned having 24 hour urine for creatinine clearance. I told her I thought Dr. Teofilo Pod ordered that if she thought it be helpful but we can certainly do that.  Therefore  lisinopril will be discontinued as of now and drugstore will be notified.

## 2016-10-03 ENCOUNTER — Telehealth: Payer: Self-pay

## 2016-10-03 MED ORDER — LEVOTHYROXINE SODIUM 75 MCG PO TABS: ORAL_TABLET | ORAL | 0 refills | Status: DC

## 2016-10-03 NOTE — Telephone Encounter (Signed)
Received fax from [place] in regards to a refill on Levothyroxine for patient. Medication was refilled per Dr. Verlene Mayer request. Sent 90 days

## 2016-10-04 ENCOUNTER — Ambulatory Visit (INDEPENDENT_AMBULATORY_CARE_PROVIDER_SITE_OTHER): Payer: Medicare Other | Admitting: Internal Medicine

## 2016-10-04 ENCOUNTER — Encounter: Payer: Self-pay | Admitting: Internal Medicine

## 2016-10-04 VITALS — BP 130/88 | HR 66 | Temp 98.2°F | Wt 174.0 lb

## 2016-10-04 DIAGNOSIS — I10 Essential (primary) hypertension: Secondary | ICD-10-CM

## 2016-10-04 DIAGNOSIS — R7302 Impaired glucose tolerance (oral): Secondary | ICD-10-CM | POA: Diagnosis not present

## 2016-10-04 DIAGNOSIS — Z23 Encounter for immunization: Secondary | ICD-10-CM

## 2016-10-04 DIAGNOSIS — E039 Hypothyroidism, unspecified: Secondary | ICD-10-CM | POA: Diagnosis not present

## 2016-10-04 LAB — COMPLETE METABOLIC PANEL WITH GFR
AG RATIO: 1.6 (calc) (ref 1.0–2.5)
ALBUMIN MSPROF: 4.7 g/dL (ref 3.6–5.1)
ALT: 17 U/L (ref 6–29)
AST: 25 U/L (ref 10–35)
Alkaline phosphatase (APISO): 81 U/L (ref 33–130)
BUN / CREAT RATIO: 18 (calc) (ref 6–22)
BUN: 18 mg/dL (ref 7–25)
CALCIUM: 10.1 mg/dL (ref 8.6–10.4)
CO2: 28 mmol/L (ref 20–32)
Chloride: 99 mmol/L (ref 98–110)
Creat: 1.02 mg/dL — ABNORMAL HIGH (ref 0.60–0.93)
GFR, EST AFRICAN AMERICAN: 62 mL/min/{1.73_m2} (ref >=60)
GFR, EST NON AFRICAN AMERICAN: 54 mL/min/{1.73_m2} — AB (ref >=60)
Globulin: 2.9 g/dL (calc) (ref 1.9–3.7)
Glucose, Bld: 102 mg/dL — ABNORMAL HIGH (ref 65–99)
POTASSIUM: 3.6 mmol/L (ref 3.5–5.3)
Sodium: 136 mmol/L (ref 135–146)
TOTAL PROTEIN: 7.6 g/dL (ref 6.1–8.1)
Total Bilirubin: 0.7 mg/dL (ref 0.2–1.2)

## 2016-10-04 LAB — CBC WITH DIFFERENTIAL/PLATELET
BASOS ABS: 53 {cells}/uL (ref 0–200)
Basophils Relative: 0.8 %
EOS ABS: 59 {cells}/uL (ref 15–500)
Eosinophils Relative: 0.9 %
HEMATOCRIT: 38.8 % (ref 35.0–45.0)
HEMOGLOBIN: 13 g/dL (ref 11.7–15.5)
LYMPHS ABS: 2112 {cells}/uL (ref 850–3900)
MCH: 28.6 pg (ref 27.0–33.0)
MCHC: 33.5 g/dL (ref 32.0–36.0)
MCV: 85.5 fL (ref 80.0–100.0)
MPV: 11.3 fL (ref 7.5–12.5)
Monocytes Relative: 11.9 %
NEUTROS ABS: 3590 {cells}/uL (ref 1500–7800)
NEUTROS PCT: 54.4 %
Platelets: 236 10*3/uL (ref 140–400)
RBC: 4.54 10*6/uL (ref 3.80–5.10)
RDW: 12.2 % (ref 11.0–15.0)
Total Lymphocyte: 32 %
WBC mixed population: 785 cells/uL (ref 200–950)
WBC: 6.6 10*3/uL (ref 3.8–10.8)

## 2016-10-04 LAB — TSH: TSH: 3.63 mIU/L (ref 0.40–4.50)

## 2016-10-04 NOTE — Patient Instructions (Signed)
Flu vaccine given. Instructions given regarding 24-hour urine collection. Lab work drawn and pending.

## 2016-10-04 NOTE — Progress Notes (Signed)
   Subjective:    Patient ID: Sharon Peters, female    DOB: 06/16/1940, 76 y.o.   MRN: [id number]  HPI Patient and I talked over the phone recently about her diagnosis of chronic kidney disease. We talked about rechecking her serum creatinine and doing some other testing. She comes in today for lab work and for further discussion of diagnosis of chronic kidney disease stage III by Nephrologist Dr. Hollie Salk.  We reviewed a number of her serum creatinine values.  It seems that around 2016 normal creatinine was lowered to 0.93 and in 2015 had been 1.10. Explained to her that this was a bit like lowering serum glucose values and having more patients develop diabetes and impaired glucose tolerance due to changing of normal values. We reviewed several different values of the past several years and noticed it was fluctuating at times. Even in November 2017 that was one value at 1.06 and then in May 2018 it was 1.23. I thought that was a considerable jump and began watching this. In June   creatinine was 1.31 and in July was 1.13 after stopping losartan.  However, Dr. Hollie Salk felt she could restart losartan if needed for hypertension. She did develop edema on a trip to [place] this summer where the temperature was extremely hot. It improved after she returned home. She is on HCTZ. She has questions about what she should be eating. She knows to watch salt. We will refer her to dietitian. She's currently on amlodipine 5 mg daily and HCTZ.  We have agreed today to check CBC with differential CMP and hemoglobin A1c. TSH was also checked. She has felt fatigued recently. She has been quite upset about this diagnosis. Explained to her that it could be followed and managed and it was nowhere near dialysis although she thinks stage III sounds very bad compared to stage I or 2. Explained to her the stages were based on glomerular filtration rates and gave her a copy of that.  She wants to do 24-hour urine for creatinine  clearance and we will certainly go ahead with that. Flu vaccine given today.  She is asking about pneumonia vaccines and they appear to be up-to-date.     Review of Systems     Objective:   Physical ExamBlood pressure today is 130/88. She says it used to run 68 diastolically at home but she may be a bit anxious. She seems worried about this diagnosis. I have attempted to reassure her. Dr. Hollie Salk plans to see her in a year. She did have a renal ultrasound that showed no blockages.         Assessment & Plan:  Elevated serum creatinine  Diagnosed with stage III chronic kidney disease by nephrologist  Essential hypertension  Impaired glucose tolerance  Obesity  Plan: Referral to dietitian. Lab work drawn and pending. She is to perform a 24-hour urine for protein and creatinine clearance at home and return specimen to this office next week.  Flu vaccine given today.

## 2016-10-08 ENCOUNTER — Telehealth: Payer: Self-pay

## 2016-10-08 MED ORDER — LEVOTHYROXINE SODIUM 75 MCG PO TABS: ORAL_TABLET | ORAL | 1 refills | Status: DC

## 2016-10-08 NOTE — Telephone Encounter (Signed)
Provider has decided to refill for 6 months.

## 2016-10-09 ENCOUNTER — Other Ambulatory Visit: Payer: Self-pay | Admitting: Internal Medicine

## 2016-10-09 DIAGNOSIS — E1122 Type 2 diabetes mellitus with diabetic chronic kidney disease: Secondary | ICD-10-CM

## 2016-10-22 LAB — CYSTINE,24 HOUR URINE

## 2016-10-22 LAB — TEST AUTHORIZATION

## 2016-10-22 LAB — CREATININE, URINE, 24 HOUR

## 2016-10-22 LAB — PROTEIN, URINE, 24 HOUR: PROTEIN 24H UR: 96 mg/(24.h) (ref 0–149)

## 2016-10-23 ENCOUNTER — Telehealth: Payer: Self-pay | Admitting: Internal Medicine

## 2016-10-23 NOTE — Telephone Encounter (Signed)
Apparently lab discarded her 24 hour urine for protein and Creatinine clearance. She will need serum Creatinine repeated if she chooses to do this collection again. Message left to that effect on her voice mail.

## 2016-11-05 NOTE — Telephone Encounter (Signed)
Patient called back she said she would like to wait until her appointment and see what Dr. Renold Genta recommends then.

## 2016-11-07 ENCOUNTER — Telehealth: Payer: Self-pay

## 2016-11-07 MED ORDER — TRAMADOL HCL 50 MG PO TABS: 50.0000 mg | ORAL_TABLET | Freq: Three times a day (TID) | ORAL | 0 refills | Status: DC | PRN

## 2016-11-07 NOTE — Telephone Encounter (Signed)
Received fax from [place] in regards to a refill on Tramadol 50mg  for patient. Medication was refilled per Dr. Verlene Mayer request. Sent #[id number]

## 2016-11-22 ENCOUNTER — Ambulatory Visit: Payer: Medicare Other | Admitting: Dietician

## 2016-11-29 ENCOUNTER — Other Ambulatory Visit: Payer: Medicare Other | Admitting: Internal Medicine

## 2016-12-03 ENCOUNTER — Telehealth: Payer: Self-pay

## 2016-12-03 NOTE — Telephone Encounter (Signed)
Pt declined

## 2016-12-03 NOTE — Telephone Encounter (Signed)
Per Dr. Lauree Chandler request I called the patient to see if she would be willing to take a Statin medication because of her impaired glucose tolerance. Had to LVM

## 2016-12-04 ENCOUNTER — Other Ambulatory Visit: Payer: Medicare Other | Admitting: Internal Medicine

## 2016-12-04 ENCOUNTER — Encounter: Payer: Medicare Other | Admitting: Internal Medicine

## 2016-12-04 DIAGNOSIS — R7302 Impaired glucose tolerance (oral): Secondary | ICD-10-CM

## 2016-12-04 DIAGNOSIS — Z0000 Encounter for general adult medical examination without abnormal findings: Secondary | ICD-10-CM

## 2016-12-04 DIAGNOSIS — I10 Essential (primary) hypertension: Secondary | ICD-10-CM

## 2016-12-04 DIAGNOSIS — G4700 Insomnia, unspecified: Secondary | ICD-10-CM

## 2016-12-04 DIAGNOSIS — N182 Chronic kidney disease, stage 2 (mild): Secondary | ICD-10-CM

## 2016-12-04 DIAGNOSIS — E785 Hyperlipidemia, unspecified: Secondary | ICD-10-CM

## 2016-12-04 DIAGNOSIS — E039 Hypothyroidism, unspecified: Secondary | ICD-10-CM

## 2016-12-05 LAB — CBC WITH DIFFERENTIAL/PLATELET
BASOS PCT: 0.7 %
Basophils Absolute: 39 cells/uL (ref 0–200)
EOS ABS: 110 {cells}/uL (ref 15–500)
Eosinophils Relative: 2 %
HCT: 37.6 % (ref 35.0–45.0)
Hemoglobin: 13 g/dL (ref 11.7–15.5)
Lymphs Abs: 1293 cells/uL (ref 850–3900)
MCH: 29.5 pg (ref 27.0–33.0)
MCHC: 34.6 g/dL (ref 32.0–36.0)
MCV: 85.5 fL (ref 80.0–100.0)
MONOS PCT: 11.1 %
MPV: 11.3 fL (ref 7.5–12.5)
Neutro Abs: 3449 cells/uL (ref 1500–7800)
Neutrophils Relative %: 62.7 %
PLATELETS: 245 10*3/uL (ref 140–400)
RBC: 4.4 10*6/uL (ref 3.80–5.10)
RDW: 12.8 % (ref 11.0–15.0)
TOTAL LYMPHOCYTE: 23.5 %
WBC mixed population: 611 cells/uL (ref 200–950)
WBC: 5.5 10*3/uL (ref 3.8–10.8)

## 2016-12-05 LAB — COMPLETE METABOLIC PANEL WITH GFR
AG Ratio: 1.5 (calc) (ref 1.0–2.5)
ALKALINE PHOSPHATASE (APISO): 74 U/L (ref 33–130)
ALT: 14 U/L (ref 6–29)
AST: 23 U/L (ref 10–35)
Albumin: 4.3 g/dL (ref 3.6–5.1)
BILIRUBIN TOTAL: 0.7 mg/dL (ref 0.2–1.2)
BUN/Creatinine Ratio: 15 (calc) (ref 6–22)
BUN: 15 mg/dL (ref 7–25)
CHLORIDE: 99 mmol/L (ref 98–110)
CO2: 31 mmol/L (ref 20–32)
Calcium: 9.6 mg/dL (ref 8.6–10.4)
Creat: 1.03 mg/dL — ABNORMAL HIGH (ref 0.60–0.93)
GFR, EST AFRICAN AMERICAN: 62 mL/min/{1.73_m2} (ref >=60)
GFR, Est Non African American: 53 mL/min/{1.73_m2} — ABNORMAL LOW (ref >=60)
GLUCOSE: 100 mg/dL — AB (ref 65–99)
Globulin: 2.8 g/dL (calc) (ref 1.9–3.7)
Potassium: 4 mmol/L (ref 3.5–5.3)
Sodium: 136 mmol/L (ref 135–146)
TOTAL PROTEIN: 7.1 g/dL (ref 6.1–8.1)

## 2016-12-05 LAB — TSH: TSH: 2.92 mIU/L (ref 0.40–4.50)

## 2016-12-05 LAB — HEMOGLOBIN A1C
EAG (MMOL/L): 7 (calc)
Hgb A1c MFr Bld: 6 % of total Hgb — ABNORMAL HIGH (ref <5.7)
Mean Plasma Glucose: 126 (calc)

## 2016-12-05 LAB — LIPID PANEL
CHOLESTEROL: 165 mg/dL (ref <200)
HDL: 58 mg/dL (ref >50)
LDL CHOLESTEROL (CALC): 83 mg/dL
Non-HDL Cholesterol (Calc): 107 mg/dL (calc) (ref <130)
TRIGLYCERIDES: 139 mg/dL (ref <150)
Total CHOL/HDL Ratio: 2.8 (calc) (ref <5.0)

## 2016-12-12 ENCOUNTER — Encounter: Payer: Self-pay | Admitting: Internal Medicine

## 2016-12-12 ENCOUNTER — Ambulatory Visit (INDEPENDENT_AMBULATORY_CARE_PROVIDER_SITE_OTHER): Payer: Medicare Other | Admitting: Internal Medicine

## 2016-12-12 VITALS — BP 130/62 | HR 70 | Temp 98.1°F | Ht 65.25 in | Wt 170.0 lb

## 2016-12-12 DIAGNOSIS — M19042 Primary osteoarthritis, left hand: Secondary | ICD-10-CM

## 2016-12-12 DIAGNOSIS — R7302 Impaired glucose tolerance (oral): Secondary | ICD-10-CM | POA: Diagnosis not present

## 2016-12-12 DIAGNOSIS — G4709 Other insomnia: Secondary | ICD-10-CM

## 2016-12-12 DIAGNOSIS — Z0000 Encounter for general adult medical examination without abnormal findings: Secondary | ICD-10-CM | POA: Diagnosis not present

## 2016-12-12 DIAGNOSIS — E8881 Metabolic syndrome: Secondary | ICD-10-CM

## 2016-12-12 DIAGNOSIS — I10 Essential (primary) hypertension: Secondary | ICD-10-CM

## 2016-12-12 DIAGNOSIS — F418 Other specified anxiety disorders: Secondary | ICD-10-CM | POA: Diagnosis not present

## 2016-12-12 DIAGNOSIS — N183 Chronic kidney disease, stage 3 unspecified: Secondary | ICD-10-CM

## 2016-12-12 DIAGNOSIS — I519 Heart disease, unspecified: Secondary | ICD-10-CM

## 2016-12-12 DIAGNOSIS — K5900 Constipation, unspecified: Secondary | ICD-10-CM | POA: Diagnosis not present

## 2016-12-12 DIAGNOSIS — M19041 Primary osteoarthritis, right hand: Secondary | ICD-10-CM

## 2016-12-12 DIAGNOSIS — E039 Hypothyroidism, unspecified: Secondary | ICD-10-CM | POA: Diagnosis not present

## 2016-12-12 DIAGNOSIS — I5189 Other ill-defined heart diseases: Secondary | ICD-10-CM

## 2016-12-12 LAB — POCT URINALYSIS DIPSTICK
Bilirubin, UA: NEGATIVE
Blood, UA: NEGATIVE
GLUCOSE UA: NEGATIVE
KETONES UA: NEGATIVE
LEUKOCYTES UA: NEGATIVE
Nitrite, UA: NEGATIVE
Protein, UA: NEGATIVE
SPEC GRAV UA: 1.015 (ref 1.010–1.025)
Urobilinogen, UA: 0.2 E.U./dL
pH, UA: 6.5 (ref 5.0–8.0)

## 2016-12-12 MED ORDER — TRAMADOL HCL 50 MG PO TABS: 50.0000 mg | ORAL_TABLET | Freq: Three times a day (TID) | ORAL | 0 refills | Status: DC | PRN

## 2016-12-12 NOTE — Patient Instructions (Signed)
There has been improvement in your serum creatinine with discontinuation of ARB medication.  Watch tramadol and take it sparingly for musculoskeletal pain.  Continue other medications as previously prescribed and meet with dietitian later this week.  Try to get some exercise.  Follow-up in 6 months.

## 2016-12-12 NOTE — Progress Notes (Signed)
Subjective:    Patient ID: Sharon Peters, female    DOB: 04-06-1940, 76 y.o.   MRN: [id number]  HPI 76 year old Female for health maintenance exam and evaluation of medical issues. Creatinine stable at 1.03  I am pleased with her creatinine.  It is improved after we discontinued ARB.  Creatinine improved from 1.13 in July to 1.03 which I think is significant off ARB.  She wants to know if it will continue to improve and I am not sure that it will with her age but this is a significant improvement to celebrate.  She does not see the nephrologist for several months.  Says they only want to see her yearly.  She still has concerns about chronic kidney disease and the fact that he could get worse but I am very encouraged with her creatinine at the present time.  Continue to reiterate that at length with her today.  We did try to get a 24-hour urine for creatinine clearance in the lab evidently misplaced the specimen and could not perform the test.  We talked about it again today and since she has had an improvement in serum creatinine we will defer that for now.  She has a history of chronic constipation and needs to be on Linzess by Dr. Cristina Gong but now takes MiraLAX.  Does not feel that she adequately evacuates with a bowel movement.  She may want to recontact Dr. Cristina Gong about restarting Linzess.  She had colonoscopy April 2017 by him which was normal except for hyperplastic polyp.  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  Also has some pain in her left foot and ankle.  History of left ventricular diastolic dysfunction, controlled type 2 diabetes mellitus, hypertension, hyperlipidemia metabolic syndrome as well as hypothyroidism.  She is intolerant of Celebrex as it causes her eyes to swell.  This would include a sulfa allergy as well.  Past medical history: Left knee arthroscopic surgery around 1987.  Tubal ligation around 1979.  In 2012 was diagnosed with a  probable meniscal tear of the left knee.  It was injected with steroids.  Has been diagnosed in the past with trochanteric bursitis of the left hip.  Social history: She is divorced and resides alone.  She is retired from [employer].  Does not smoke.  Rare alcohol consumption.  2 adult children, a son and a daughter.  Family history: Brother with history of liver cancer secondary to hepatitis B.  Father died age 48 for pancreatic cancer.  Mother died at age 41 of lung cancer.  3 brothers and 1 sister.      Review of Systems recent URI symptoms.  Has been coughing.  Delsym recommended.  No fever or chills.  Is only had this for about a week or so.  Needs to give it more time.  Do not feel that she needs antibiotics.     Objective:   Physical Exam  Constitutional: She is oriented to person, place, and time. She appears well-developed and well-nourished. No distress.  HENT:  Head: Normocephalic and atraumatic.  Right Ear: External ear normal.  Left Ear: External ear normal.  Mouth/Throat: Oropharynx is clear and moist.  Eyes: Conjunctivae and EOM are normal. Pupils are equal, round, and reactive to light. Right eye exhibits no discharge. Left eye exhibits no discharge.  Neck: Neck supple. No JVD present. No thyromegaly present.  Cardiovascular: Normal rate, regular rhythm, normal heart sounds and intact distal  pulses.  No murmur heard. Pulmonary/Chest: Effort normal and breath sounds normal. No respiratory distress. She has no wheezes. She has no rales. She exhibits no tenderness.  Abdominal: Soft. Bowel sounds are normal. She exhibits no mass. There is no tenderness. There is no rebound and no guarding.  Appears slightly distended with stool.  Genitourinary:  Genitourinary Comments: Bimanual normal.  Pap deferred due to age  Musculoskeletal: She exhibits no edema.  No swollen joints  Lymphadenopathy:    She has no cervical adenopathy.  Neurological: She is alert and oriented to  person, place, and time. She has normal reflexes. No cranial nerve deficit. Coordination normal.  Skin: Skin is warm and dry. No rash noted. She is not diaphoretic.  Psychiatric: She has a normal mood and affect. Her behavior is normal. Judgment and thought content normal.  Anxious about kidney disease  Vitals reviewed.         Assessment & Plan:  Impaired glucose tolerance-she is on metformin and hemoglobin A1c is 6.0% to see dietitian in the near future  Osteoarthritis of hands and feet  Essential hypertension currently just on amlodipine and HCTZ  Hypothyroidism stable on levothyroxine 0.075 mg daily  Diastolic dysfunction  Obesity  Chronic constipation currently just on MiraLAX  Recent URI  History of chronic kidney disease improved off ARB-can take tramadol for pain but not NSAIDs.  This was refilled  BMI 28.08-to see dietitian this week  [employer] has questioned whether or not she needs to be on lipid-lowering medication with history of impaired glucose tolerance.  I feel like that we do not need to add more medication with history of chronic kidney disease and we will continue to monitor for now.  She can [facility] at next visit what their opinion is.  Her hemoglobin A1c is stable with 500 mg daily of metformin.  She will be returning here in 6 months.  Subjective:   Patient presents for Medicare Annual/Subsequent preventive examination.  Review Past Medical/Family/Social: See above   Risk Factors  Current exercise habits: Not sure that she exercises a great deal due to musculoskeletal pain Dietary issues discussed: To see the dietitian later this week  Cardiac risk factors: Hyperlipidemia and impaired glucose tolerance  Depression Screen  (Note: if answer to either of the following is "Yes", a more complete depression screening is indicated)   Over the past two weeks, have you felt down, depressed or hopeless? No  Over the past two weeks,  have you felt little interest or pleasure in doing things? No Have you lost interest or pleasure in daily life? No Do you often feel hopeless? No Do you cry easily over simple problems? No   Activities of Daily Living  In your present state of health, do you have any difficulty performing the following activities?:   Driving? No  Managing money? No  Feeding yourself? No  Getting from bed to chair? No  Climbing a flight of stairs? No  Preparing food and eating?: No  Bathing or showering? No  Getting dressed: No  Getting to the toilet? No  Using the toilet:No  Moving around from place to place: No  In the past year have you fallen or had a near fall?:No  Are you sexually active? No  Do you have more than one partner? No   Hearing Difficulties: No  Do you often ask people to speak up or repeat themselves? No  Do you experience ringing or noises in your ears? No  Do  you have difficulty understanding soft or whispered voices? No  Do you feel that you have a problem with memory? No Do you often misplace items? No    Home Safety:  Do you have a smoke alarm at your residence? Yes Do you have grab bars in the bathroom?  Yes Do you have throw rugs in your house?  Yes   Cognitive Testing  Alert? Yes Normal Appearance?Yes  Oriented to person? Yes Place? Yes  Time? Yes  Recall of three objects? Yes  Can perform simple calculations? Yes  Displays appropriate judgment?Yes  Can read the correct time from a watch face?Yes   List the Names of Other Physician/Practitioners you currently use:  See referral list for the physicians patient is currently seeing.  Rheumatologist  Nephrologist   Review of Systems: See above   Objective:     General appearance: Appears stated age and mildly obese  Head: Normocephalic, without obvious abnormality, atraumatic  Eyes: conj clear, EOMi PEERLA  Ears: normal TM's and external ear canals both ears  Nose: Nares normal. Septum midline.  Mucosa normal. No drainage or sinus tenderness.  Throat: lips, mucosa, and tongue normal; teeth and gums normal  Neck: no adenopathy, no carotid bruit, no JVD, supple, symmetrical, trachea midline and thyroid not enlarged, symmetric, no tenderness/mass/nodules  No CVA tenderness.  Lungs: clear to auscultation bilaterally  Breasts: normal appearance, no masses or tenderness  Heart: regular rate and rhythm, S1, S2 normal, no murmur, click, rub or gallop  Abdomen: soft, non-tender; bowel sounds normal; no masses, no organomegaly  Musculoskeletal: ROM normal in all joints, no crepitus, no deformity, Normal muscle strengthen. Back  is symmetric, no curvature. Skin: Skin color, texture, turgor normal. No rashes or lesions  Lymph nodes: Cervical, supraclavicular, and axillary nodes normal.  Neurologic: CN 2 -12 Normal, Normal symmetric reflexes. Normal coordination and gait  Psych: Alert & Oriented x 3, Mood appear stable.    Assessment:    Annual wellness medicare exam   Plan:    During the course of the visit the patient was educated and counseled about appropriate screening and preventive services including:        Patient Instructions (the written plan) was given to the patient.  Medicare Attestation  I have personally reviewed:  The patient's medical and social history  Their use of alcohol, tobacco or illicit drugs  Their current medications and supplements  The patient's functional ability including ADLs,fall risks, home safety risks, cognitive, and hearing and visual impairment  Diet and physical activities  Evidence for depression or mood disorders  The patient's weight, height, BMI, and visual acuity have been recorded in the chart. I have made referrals, counseling, and provided education to the patient based on review of the above and I have provided the patient with a written personalized care plan for preventive services.

## 2016-12-13 ENCOUNTER — Encounter: Payer: Self-pay | Admitting: Dietician

## 2016-12-13 ENCOUNTER — Encounter: Payer: Medicare Other | Attending: Internal Medicine | Admitting: Dietician

## 2016-12-13 DIAGNOSIS — E1122 Type 2 diabetes mellitus with diabetic chronic kidney disease: Secondary | ICD-10-CM | POA: Insufficient documentation

## 2016-12-13 DIAGNOSIS — Z713 Dietary counseling and surveillance: Secondary | ICD-10-CM | POA: Insufficient documentation

## 2016-12-13 DIAGNOSIS — R7302 Impaired glucose tolerance (oral): Secondary | ICD-10-CM | POA: Insufficient documentation

## 2016-12-13 DIAGNOSIS — E119 Type 2 diabetes mellitus without complications: Secondary | ICD-10-CM

## 2016-12-13 DIAGNOSIS — N182 Chronic kidney disease, stage 2 (mild): Secondary | ICD-10-CM | POA: Insufficient documentation

## 2016-12-13 NOTE — Progress Notes (Signed)
Medical Nutrition Therapy:  Appt start time: 1430 end time:  1630.   Assessment:  Primary concerns today: Patient is here today alone.  She is concerned about nutrition related to her kidney function as well as unintentional weight loss.   She goes to [facility] 2 days per week but does not think this is the cause of her weight loss.  History includes:  Type 2 Diabetes,  HTN, chronic constipation requiring Murelax every other day, and CKD stage 3. Labs include A1C 6.0%, BUN 15, Creatinine 1.03, Potassium 4.0, GFR 53 (12/04/16).  She has seen a nephrologist and goes back next year.  She is very concerned about her kidney function. She checks her blood sugar 3 times per week and readings are 90-98 before breakfast.  Weight hx: 111 lbs when she got married 165 lbs after the birth of her daughter 186 lbs this year 170 lbs yesterday  Based on diet hx, expect weight loss is due to eating less.  Decreased appetite with increased constipation.  Patient lives alone.  She is retired from [employer] as a Librarian, academic for [employer]. Marland Kitchen  Preferred Learning Style:   No preference indicated   Learning Readiness:   Ready  MEDICATIONS: see list to include Metformin   DIETARY INTAKE:  Usual eating pattern includes 2-3 meals and 0-1 snacks per day.  Everyday foods include Lite Salt.  She has begun to lower her sodium intake further.    24-hr recall:  B ( AM): oatmeal with brown sugar, nuts and cinnamon, 2 T half and half OR cream of wheat with 2 T half and half OR special K (or other unsweetened cereal) and milk OR egg, bacon, Wheat or White toast OR yogurt with granola and or nuts Snk ( AM): none  L ( PM): yogurt, 1 T almonds, 3 crackers with peanut butter OR leftovers or Soup, crackers Out to eat with her daughter Snk ( PM): occasional halo ice cream, apple or banana or orange D ( PM): cereal and milk OR baked potato, roast beef, 1/2 [employer] roll OR shrimp and broccoli Snk ( PM):  none Beverages: 8-10 cups water daily, 2% milk, 4-6 ounces OJ, V-8 (LS vegetable), occasional wine.  Usual physical activity: walks 2-3 times per week for 30-45 minutes.  She does her own housekeeping and yard work.  She has silver sneakers but prefers to exercise at [facility].  She also has a treadmill at home.     Progress Towards Goal(s):  In progress.   Nutritional Diagnosis:  NB-1.1 Food and nutrition-related knowledge deficit As related to nutrition for ckd and diabetes.  As evidenced by patient report.    Intervention:  Nutrition education related to type 2 diabetes and CKD.  Discussed no need for over restriction of her diet related to CKD.  Discussed restrictions related to CKD reflective of lab work.  Her potassium is WNL and there is no need to restrict this at this time.  Continue to reduce sodium intake.  Use Lite salt at her discretion but discontinue if blood potassium goes up.  Discussed benefits of current habits related to nutrition and exercise to maintain a healthy blood sugar.  Discussed tips to help with her constipation.  Be mindful of sodium intake. Drink plenty of fluid without carbs. Avoid dark soda or other things that use Phosphorous as a preservative. Increase your vegetable intake. Consider chia seeds or ground flax seeds in your oatmeal. Consider discussing a probiotic with your doctor. Moderate amounts  protein.  Plant protein is great (beans, nuts, seeds).  Teaching Method Utilized:  Visual Auditory  Handouts given during visit include:  Meal plan card  My plate  Kidney pyramid  Barriers to learning/adherence to lifestyle change: none  Demonstrated degree of understanding via:  Teach Back   Monitoring/Evaluation:  Dietary intake, exercise, and body weight prn.

## 2017-01-28 ENCOUNTER — Telehealth: Payer: Self-pay

## 2017-01-28 NOTE — Telephone Encounter (Signed)
Patient called she would like a referral to Dr. Amil Amen at [facility] for (arthritis). They need a referral from [place], fax office notes and most recent labs.

## 2017-01-28 NOTE — Telephone Encounter (Signed)
FAXED

## 2017-01-28 NOTE — Telephone Encounter (Signed)
You will need to fax her visits for past 3 years to [facility] and ask for appt with Dr. Amil Amen on the fax cover sheet.

## 2017-03-26 ENCOUNTER — Other Ambulatory Visit: Payer: Self-pay | Admitting: Internal Medicine

## 2017-03-27 ENCOUNTER — Telehealth: Payer: Self-pay | Admitting: Internal Medicine

## 2017-03-27 NOTE — Telephone Encounter (Signed)
[facility] pharmacy calling to request refill on behalf of patient on her Ambien 5 mg.    Call back [phone]  Thank you.

## 2017-03-27 NOTE — Telephone Encounter (Signed)
Refill x 6 months 

## 2017-03-27 NOTE — Telephone Encounter (Signed)
Refill Tramadol x 90 days and other meds x 6 months

## 2017-03-28 MED ORDER — ZOLPIDEM TARTRATE 10 MG PO TABS: 10.0000 mg | ORAL_TABLET | Freq: Every evening | ORAL | 5 refills | Status: DC | PRN

## 2017-03-28 NOTE — Telephone Encounter (Signed)
Called in to  [facility] [place], [place] [address] [facility] [address] [phone] (Phone) [phone] (Fax)

## 2017-03-29 NOTE — Telephone Encounter (Signed)
Spoken to patient. Per Dr Renold Genta, since insurance denied prior [place] for [place]. Patient may pay out of pocket if she choose.   Patient verbalized understanding.

## 2017-05-23 ENCOUNTER — Other Ambulatory Visit: Payer: Self-pay

## 2017-05-24 ENCOUNTER — Encounter: Payer: Self-pay | Admitting: Internal Medicine

## 2017-05-24 LAB — HM DIABETES EYE EXAM

## 2017-06-11 ENCOUNTER — Other Ambulatory Visit: Payer: Medicare Other | Admitting: Internal Medicine

## 2017-06-11 DIAGNOSIS — R7302 Impaired glucose tolerance (oral): Secondary | ICD-10-CM

## 2017-06-11 DIAGNOSIS — E785 Hyperlipidemia, unspecified: Secondary | ICD-10-CM

## 2017-06-11 DIAGNOSIS — N182 Chronic kidney disease, stage 2 (mild): Secondary | ICD-10-CM

## 2017-06-11 DIAGNOSIS — I10 Essential (primary) hypertension: Secondary | ICD-10-CM

## 2017-06-11 DIAGNOSIS — E039 Hypothyroidism, unspecified: Secondary | ICD-10-CM

## 2017-06-12 LAB — LIPID PANEL
Cholesterol: 183 mg/dL (ref <200)
HDL: 59 mg/dL (ref >50)
LDL Cholesterol (Calc): 100 mg/dL (calc) — ABNORMAL HIGH
Non-HDL Cholesterol (Calc): 124 mg/dL (calc) (ref <130)
Total CHOL/HDL Ratio: 3.1 (calc) (ref <5.0)
Triglycerides: 147 mg/dL (ref <150)

## 2017-06-12 LAB — MICROALBUMIN / CREATININE URINE RATIO
CREATININE, URINE: 169 mg/dL (ref 20–275)
Microalb Creat Ratio: 6 mcg/mg creat (ref <30)
Microalb, Ur: 1 mg/dL

## 2017-06-12 LAB — BASIC METABOLIC PANEL
BUN/Creatinine Ratio: 21 (calc) (ref 6–22)
BUN: 21 mg/dL (ref 7–25)
CALCIUM: 9.5 mg/dL (ref 8.6–10.4)
CHLORIDE: 101 mmol/L (ref 98–110)
CO2: 31 mmol/L (ref 20–32)
Creat: 0.98 mg/dL — ABNORMAL HIGH (ref 0.60–0.93)
Glucose, Bld: 100 mg/dL — ABNORMAL HIGH (ref 65–99)
POTASSIUM: 3.9 mmol/L (ref 3.5–5.3)
Sodium: 138 mmol/L (ref 135–146)

## 2017-06-12 LAB — HEMOGLOBIN A1C
HEMOGLOBIN A1C: 6.1 %{Hb} — AB (ref <5.7)
Mean Plasma Glucose: 128 (calc)
eAG (mmol/L): 7.1 (calc)

## 2017-06-12 LAB — TSH: TSH: 6.95 mIU/L — ABNORMAL HIGH (ref 0.40–4.50)

## 2017-06-13 ENCOUNTER — Ambulatory Visit (INDEPENDENT_AMBULATORY_CARE_PROVIDER_SITE_OTHER): Payer: Medicare Other | Admitting: Internal Medicine

## 2017-06-13 ENCOUNTER — Telehealth: Payer: Self-pay | Admitting: Internal Medicine

## 2017-06-13 ENCOUNTER — Encounter: Payer: Self-pay | Admitting: Internal Medicine

## 2017-06-13 VITALS — BP 140/80 | HR 68 | Ht 65.25 in | Wt 171.0 lb

## 2017-06-13 DIAGNOSIS — R7302 Impaired glucose tolerance (oral): Secondary | ICD-10-CM

## 2017-06-13 DIAGNOSIS — N183 Chronic kidney disease, stage 3 unspecified: Secondary | ICD-10-CM

## 2017-06-13 DIAGNOSIS — E8881 Metabolic syndrome: Secondary | ICD-10-CM

## 2017-06-13 DIAGNOSIS — E039 Hypothyroidism, unspecified: Secondary | ICD-10-CM

## 2017-06-13 DIAGNOSIS — I10 Essential (primary) hypertension: Secondary | ICD-10-CM

## 2017-06-13 NOTE — Telephone Encounter (Signed)
Patient is calling with BP reading from about 1:30 p.m. This afternoon.  138/59 States she will most likely take it again one more time later on this evening just to make sure she stays on top of it this evening.  If it doesn't stay down this evening, she will call back and let us know.    Thank you.

## 2017-06-13 NOTE — Patient Instructions (Signed)
Continue to monitor  blood pressure at home and call if persistently elevated.  Watch diet.  Try to get some exercise.  Recheck TSH in 6 weeks.  Do not take laxative at same time you are taking thyroid replacement medication.

## 2017-06-13 NOTE — Progress Notes (Signed)
   Subjective:    Patient ID: Sharon Peters, female    DOB: [birth year], 77 y.o.   MRN: [id number]  77 year old Female in today for 63-month recheck.  Her TSH is elevated but she is not been taking it at night on a completely empty stomach because she has been taking a laxative with it.  She needs to have this repeated in 6 weeks and will take the laxative at another time.  Her blood pressure is 140/80.  Says it has been running much better than this at home.  She called back with blood pressure reading from home which was 138/59.  She will continue to follow it closely.  I think she has an element of office hypertension.  Interestingly, her creatinine is now 0.98.  She is off lisinopril.  6 months ago creatinine was 1.03.  Hemoglobin A1c is 6.1 and previously was 6.0%  LDL cholesterol is 100 and previously was 83 6 months ago.  Oddly her TSH is 6.95 and 6 months ago was 2.92.  We have decided she will take a laxative at another time and have follow-up TSH in 6 weeks.       Review of Systems see above     Objective:   Physical Exam Skin warm and dry.  Nodes none.  Neck supple without JVD thyromegaly or carotid bruits.  Chest clear.  Cardiac exam regular rate and rhythm.  Extremities without edema.  No hepatosplenomegaly.       Assessment & Plan:  History of chronic kidney disease stage III-stopping lisinopril seem to have helped her creatinine and she will remain off of it.  I am pleased with her creatinine at the present time  Hypothyroidism-elevated TSH detected but may be due to taking laxative around the same time as she is taking her thyroid replacement medication at bedtime.  She will change the timing of the Senokot.  We will follow-up in 6 weeks and not change dose of thyroid replacement at the present time  Hyperlipidemia  Impaired glucose tolerance  Anxiety  Elevated blood pressure today in the office may be an element of office hypertension and she will continue to  monitor at home.  She will follow-up in 6 weeks.

## 2017-06-25 ENCOUNTER — Other Ambulatory Visit: Payer: Self-pay | Admitting: Internal Medicine

## 2017-07-25 ENCOUNTER — Ambulatory Visit (INDEPENDENT_AMBULATORY_CARE_PROVIDER_SITE_OTHER): Payer: Medicare Other | Admitting: Internal Medicine

## 2017-07-25 ENCOUNTER — Encounter: Payer: Self-pay | Admitting: Internal Medicine

## 2017-07-25 VITALS — BP 130/70 | HR 75 | Ht 65.25 in | Wt 176.0 lb

## 2017-07-25 DIAGNOSIS — R635 Abnormal weight gain: Secondary | ICD-10-CM

## 2017-07-25 DIAGNOSIS — I10 Essential (primary) hypertension: Secondary | ICD-10-CM

## 2017-07-25 DIAGNOSIS — R7989 Other specified abnormal findings of blood chemistry: Secondary | ICD-10-CM

## 2017-07-25 DIAGNOSIS — E039 Hypothyroidism, unspecified: Secondary | ICD-10-CM

## 2017-07-25 LAB — TSH: TSH: 2.14 m[IU]/L (ref 0.40–4.50)

## 2017-07-25 NOTE — Addendum Note (Signed)
Addended by: Mady Haagensen on: 07/25/2017 11:42 AM   Modules accepted: Orders

## 2017-07-25 NOTE — Patient Instructions (Signed)
BP stable on current regimen. Weight has increased 5 pounds. Watch diet and get more exercise. RTC November. TSH drawn today with further instructions to follow.

## 2017-07-25 NOTE — Progress Notes (Signed)
   Subjective:    Patient ID: Sharon Peters, female    DOB: [birth year], 77 y.o.   MRN: [id number]  HPI 77 year old Female for follow up of elevated TSH with history of hypothyroidism. Has recently been taking thyroid med on empty stomach with no other med. TSH will be drawn today.BP stable at home per patient.Hx HTN and BP was elevated at last visit.    Review of Systems     Objective:   Physical Exam BP 130/70 today.       Assessment & Plan:  Essential HTN- stable on current regimen  Elevated TSH repeated today as she was taking thyroid replacement with other meds at last measurement.  Weight gain- try to watch diet and exercise more despite hot weather. Weight has increased 5 pounds since May.  Plan: RTC for CPE November. Appt has been made. TSH drawn and pending with further instructions to follow.

## 2017-08-26 IMAGING — CR DG FOOT COMPLETE 3+V*R*
3 series · 3 of 3 positions shown · non-contrast
Comparison: None

CLINICAL DATA: Pain at navicular and medial cuneiform bones RIGHT
foot for 3 months, no trauma, slight soft tissue swelling and
tenderness, question fracture versus arthritis, walks for exercise,
history hypertension, diabetes mellitus, initial encounter

EXAM:
RIGHT FOOT COMPLETE - 3+ VIEW

[x foot ap right]
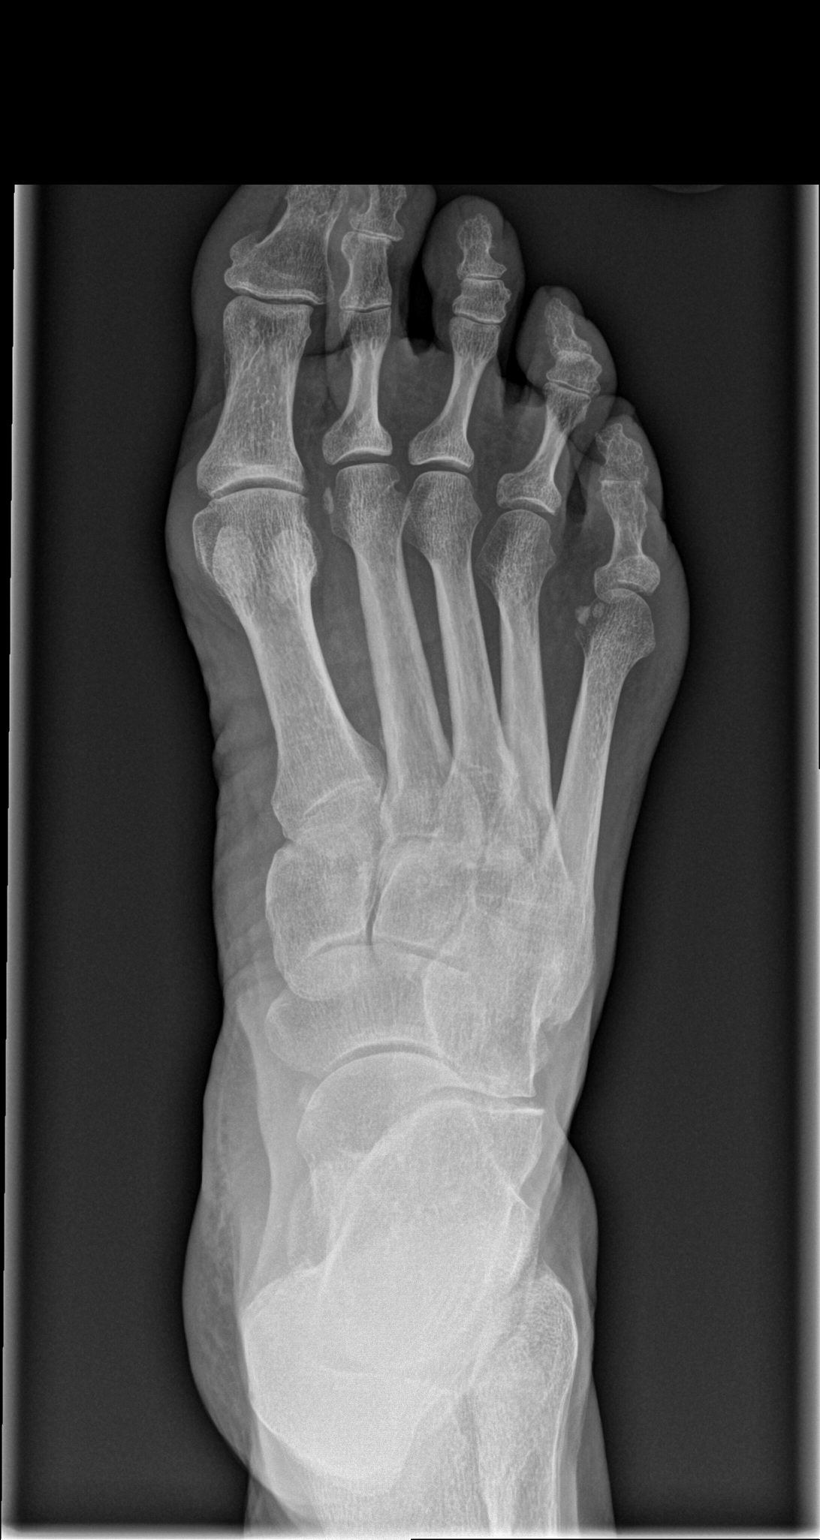

[x foot obl right]
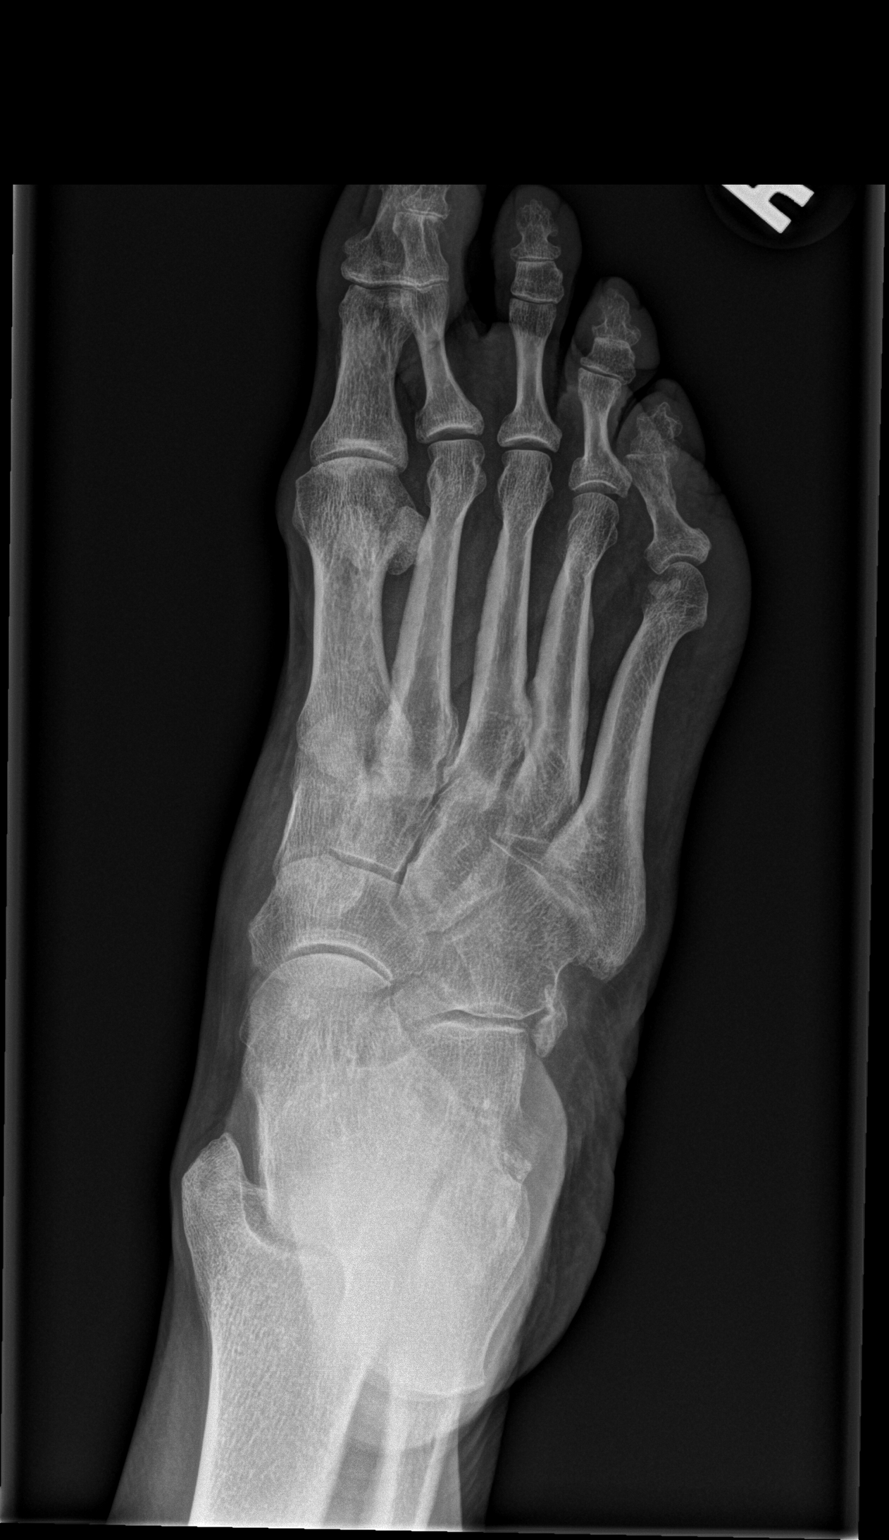

[x foot lat right]
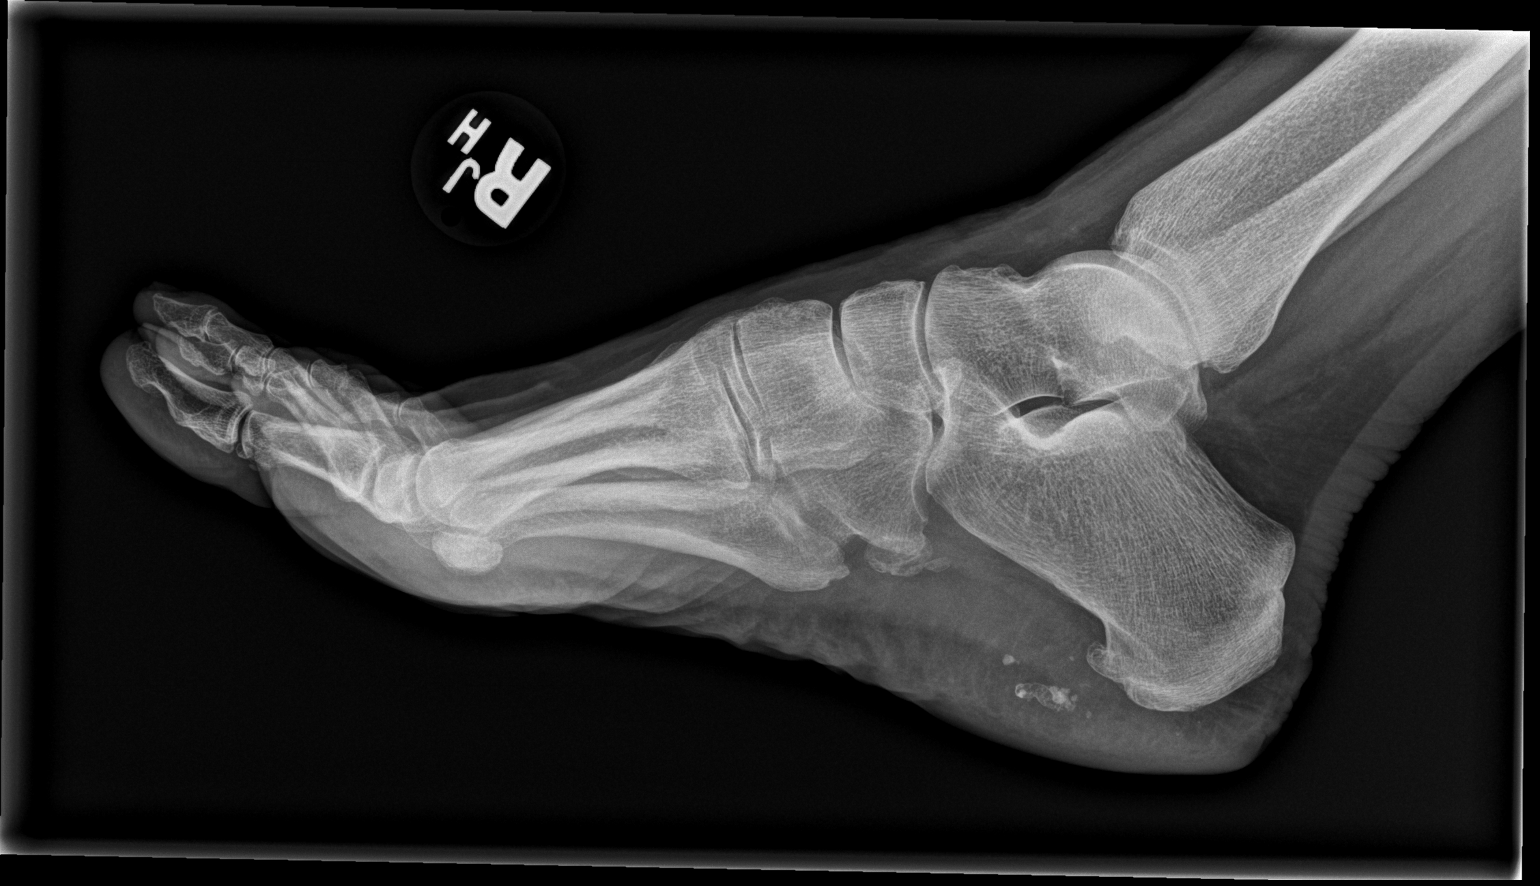

[3 of 3 positions shown; findings below may reference images not displayed]

FINDINGS: Mild diffuse osseous demineralization.

Joint spaces preserved.

Plantar calcaneal spur.

Soft tissue calcifications at plantar aspect of hindfoot.

No acute fracture, dislocation or bone destruction.
IMPRESSION: No acute osseous abnormalities.

Plantar calcaneal spurring.

## 2017-09-27 ENCOUNTER — Other Ambulatory Visit: Payer: Self-pay

## 2017-09-27 ENCOUNTER — Other Ambulatory Visit: Payer: Self-pay | Admitting: Internal Medicine

## 2017-09-27 MED ORDER — ZOLPIDEM TARTRATE 5 MG PO TABS: 5.0000 mg | ORAL_TABLET | Freq: Every evening | ORAL | 1 refills | Status: DC | PRN

## 2017-09-30 ENCOUNTER — Other Ambulatory Visit: Payer: Self-pay

## 2017-09-30 MED ORDER — AMLODIPINE BESYLATE 5 MG PO TABS: 5.0000 mg | ORAL_TABLET | Freq: Every day | ORAL | 1 refills | Status: DC

## 2017-10-01 ENCOUNTER — Telehealth: Payer: Self-pay | Admitting: Internal Medicine

## 2017-10-01 NOTE — Telephone Encounter (Signed)
Needs testing strips:  She uses One Touch Ultra Strips.    She tests once daily.    CPE scheduled for 12/16/17.  Pharmacy:  [facility] [address]   Phone #:  [phone]  Thank you.

## 2017-10-01 NOTE — Telephone Encounter (Signed)
Faxed rx to [facility] 

## 2017-10-01 NOTE — Telephone Encounter (Signed)
Script written and can be faxed

## 2017-10-03 ENCOUNTER — Ambulatory Visit (INDEPENDENT_AMBULATORY_CARE_PROVIDER_SITE_OTHER): Payer: Medicare Other

## 2017-10-03 ENCOUNTER — Ambulatory Visit (INDEPENDENT_AMBULATORY_CARE_PROVIDER_SITE_OTHER): Payer: Medicare Other | Admitting: Podiatry

## 2017-10-03 VITALS — BP 132/77 | HR 78

## 2017-10-03 DIAGNOSIS — M2041 Other hammer toe(s) (acquired), right foot: Secondary | ICD-10-CM

## 2017-10-03 DIAGNOSIS — B351 Tinea unguium: Secondary | ICD-10-CM

## 2017-10-07 NOTE — Progress Notes (Signed)
Subjective:   Patient ID: Sharon Peters, female   DOB: 77 y.o.   MRN: [id number]   HPI 77 year old female presents the office today for concerns of nail fungus.  She states that her nails are thick ulceration.  She previously was treated for Jublia for 1.5 years and she stopped using it did not seem to improve.  She also states that her second toe certainly over her big toe over the last year she developed a hammertoe.  No recent treatment for this.   Review of Systems  All other systems reviewed and are negative.  Past Medical History:  Diagnosis Date  . Chest discomfort    Nuclear, December, 2008, normal  . Diabetes mellitus type II   . Ejection fraction    EF 65%, February, 2010, trivial pericardial effusion,  . HTN (hypertension)   . Hypothyroidism   . Obesity   . Pericardial effusion    Trivial, echo, February, 2011  /     small...int he past...unexplained...normal sedimentation rate and ANA    Past Surgical History:  Procedure Laterality Date  . left arthroscopic knee surgery    . TUBAL LIGATION       Current Outpatient Medications:  .  acetaminophen (TYLENOL) 500 MG tablet, Take 500 mg by mouth every 6 (six) hours as needed., Disp: , Rfl:  .  amLODipine (NORVASC) 5 MG tablet, Take 1 tablet (5 mg total) by mouth daily., Disp: 90 tablet, Rfl: 1 .  aspirin EC 81 MG tablet, Take 81 mg by mouth daily., Disp: , Rfl:  .  Cholecalciferol (VITAMIN D-3) 1000 units CAPS, Take 5,000 Units by mouth., Disp: , Rfl:  .  diclofenac sodium (VOLTAREN) 1 % GEL, Apply 1 g topically 3 (three) times daily., Disp: , Rfl: 2 .  hydrochlorothiazide (HYDRODIURIL) 25 MG tablet, TAKE 1 TABLET BY MOUTH ONCE DAILY, Disp: 90 tablet, Rfl: 0 .  levothyroxine (SYNTHROID, LEVOTHROID) 75 MCG tablet, TAKE 1 TABLET BY MOUTH ONCE DAILY BEFORE BREAKFAST, Disp: 90 tablet, Rfl: 0 .  metFORMIN (GLUCOPHAGE) 500 MG tablet, TAKE 1 TABLET BY MOUTH ONCE DAILY WITH BREAKFAST, Disp: 90 tablet, Rfl: 0 .  Polyethylene  Glycol 3350 (MIRALAX PO), Take 1 Dose by mouth daily as needed., Disp: , Rfl:  .  Sennosides (SENOKOT PO), Take by mouth daily., Disp: , Rfl:  .  traMADol (ULTRAM) 50 MG tablet, TAKE 1 TABLET(50 MG) BY MOUTH EVERY 8 HOURS AS NEEDED FOR SEVERE PAIN, Disp: 90 tablet, Rfl: 0 .  zolpidem (AMBIEN) 5 MG tablet, Take 1 tablet (5 mg total) by mouth at bedtime as needed. for sleep, Disp: 90 tablet, Rfl: 1  Allergies  Allergen Reactions  . Celecoxib Swelling    Eyes swell shut         Objective:  Physical Exam  General: AAO x3, NAD  Dermatological: Nails appear to be somewhat thickened and discolored with yellow discoloration.  There is no pain in the nails there is no swelling redness or drainage or any signs of infection.  There is starting the callus along the medial aspect of the right second toe on the PIPJ.  There is no other open lesions or pre-ulcerations.  Vascular: Dorsalis Pedis artery and Posterior Tibial artery pedal pulses are 2/4 bilateral with immedate capillary fill time. There is no pain with calf compression, swelling, warmth, erythema.   Neruologic: Grossly intact via light touch bilateral. Protective threshold with Semmes Wienstein monofilament intact to all pedal sites bilateral.   Musculoskeletal: There is  medial deviation of the right second toe mostly at the level of the DIPJ there is decreased range of motion of the joint.  Muscular strength 5/5 in all groups tested bilateral.  Gait: Unassisted, Nonantalgic.       Assessment:   Onychodystrophy, likely onychomycosis with digital deformity     Plan:  -Treatment options discussed including all alternatives, risks, and complications -Etiology of symptoms were discussed -X-rays were obtained and reviewed with the patient.  Lateral deviation of the second toe at the DIPJ.  No evidence of acute fracture. -Regards to the toenail did debride his nail to any complications I sent the trimmings for culture, pathology to  [facility] to the toe toe separator was dispensed as well as offloading pads.  Discussed possible callus formation given the pressure. -Follow-up with your nail culture sooner if needed  Trula Slade DPM

## 2017-10-08 ENCOUNTER — Ambulatory Visit (INDEPENDENT_AMBULATORY_CARE_PROVIDER_SITE_OTHER): Payer: Medicare Other | Admitting: Internal Medicine

## 2017-10-08 DIAGNOSIS — Z23 Encounter for immunization: Secondary | ICD-10-CM | POA: Diagnosis not present

## 2017-10-08 NOTE — Progress Notes (Signed)
Flu vaccine given by CMA 

## 2017-10-08 NOTE — Patient Instructions (Signed)
Patient received a flu vaccine left deltoid IM, AV,CMA

## 2017-10-21 ENCOUNTER — Telehealth: Payer: Self-pay | Admitting: Podiatry

## 2017-10-21 NOTE — Telephone Encounter (Signed)
I informed pt of Dr. Leigh Aurora review of results and orders. I explained to pt the use of Revitaderm40, and cost. Pt states she would like to try Revitaderm40 and I placed a jar aside for her at front reception.

## 2017-10-21 NOTE — Telephone Encounter (Signed)
Val- please let her know that the culture did not show fungus and is due to microtrauma. We can try urea gel to the nail to see if this will help. Thanks.

## 2017-10-21 NOTE — Telephone Encounter (Signed)
I was calling to see if you have the results from the labs where a clipping of my toenail was sent off on 19 September. Thank you.

## 2017-12-10 ENCOUNTER — Other Ambulatory Visit (INDEPENDENT_AMBULATORY_CARE_PROVIDER_SITE_OTHER): Payer: Medicare Other | Admitting: Internal Medicine

## 2017-12-10 DIAGNOSIS — I5189 Other ill-defined heart diseases: Secondary | ICD-10-CM

## 2017-12-10 DIAGNOSIS — N183 Chronic kidney disease, stage 3 unspecified: Secondary | ICD-10-CM

## 2017-12-10 DIAGNOSIS — I10 Essential (primary) hypertension: Secondary | ICD-10-CM

## 2017-12-10 DIAGNOSIS — M19042 Primary osteoarthritis, left hand: Secondary | ICD-10-CM

## 2017-12-10 DIAGNOSIS — E8881 Metabolic syndrome: Secondary | ICD-10-CM

## 2017-12-10 DIAGNOSIS — E039 Hypothyroidism, unspecified: Secondary | ICD-10-CM

## 2017-12-10 DIAGNOSIS — R8290 Unspecified abnormal findings in urine: Secondary | ICD-10-CM

## 2017-12-10 DIAGNOSIS — G4709 Other insomnia: Secondary | ICD-10-CM

## 2017-12-10 DIAGNOSIS — R7302 Impaired glucose tolerance (oral): Secondary | ICD-10-CM

## 2017-12-10 DIAGNOSIS — R4589 Other symptoms and signs involving emotional state: Secondary | ICD-10-CM

## 2017-12-10 DIAGNOSIS — M19041 Primary osteoarthritis, right hand: Secondary | ICD-10-CM

## 2017-12-10 DIAGNOSIS — Z0000 Encounter for general adult medical examination without abnormal findings: Secondary | ICD-10-CM

## 2017-12-10 DIAGNOSIS — F418 Other specified anxiety disorders: Secondary | ICD-10-CM

## 2017-12-10 LAB — POCT URINALYSIS DIPSTICK
BILIRUBIN UA: NEGATIVE
Blood, UA: NEGATIVE
GLUCOSE UA: NEGATIVE
Ketones, UA: NEGATIVE
Nitrite, UA: NEGATIVE
Protein, UA: POSITIVE — AB
Spec Grav, UA: 1.01 (ref 1.010–1.025)
Urobilinogen, UA: 0.2 E.U./dL
pH, UA: 6.5 (ref 5.0–8.0)

## 2017-12-10 NOTE — Addendum Note (Signed)
Addended by: Mady Haagensen on: 12/10/2017 09:57 AM   Modules accepted: Orders

## 2017-12-11 LAB — URINE CULTURE
MICRO NUMBER: 91426306
SPECIMEN QUALITY:: ADEQUATE

## 2017-12-11 LAB — CBC WITH DIFFERENTIAL/PLATELET
BASOS PCT: 0.8 %
Basophils Absolute: 38 cells/uL (ref 0–200)
EOS ABS: 89 {cells}/uL (ref 15–500)
Eosinophils Relative: 1.9 %
HCT: 37.6 % (ref 35.0–45.0)
Hemoglobin: 12.9 g/dL (ref 11.7–15.5)
Lymphs Abs: 1316 cells/uL (ref 850–3900)
MCH: 29.8 pg (ref 27.0–33.0)
MCHC: 34.3 g/dL (ref 32.0–36.0)
MCV: 86.8 fL (ref 80.0–100.0)
MONOS PCT: 12.5 %
MPV: 11.4 fL (ref 7.5–12.5)
Neutro Abs: 2670 cells/uL (ref 1500–7800)
Neutrophils Relative %: 56.8 %
PLATELETS: 224 10*3/uL (ref 140–400)
RBC: 4.33 10*6/uL (ref 3.80–5.10)
RDW: 12.4 % (ref 11.0–15.0)
TOTAL LYMPHOCYTE: 28 %
WBC mixed population: 588 cells/uL (ref 200–950)
WBC: 4.7 10*3/uL (ref 3.8–10.8)

## 2017-12-11 LAB — COMPLETE METABOLIC PANEL WITH GFR
AG Ratio: 2 (calc) (ref 1.0–2.5)
ALT: 22 U/L (ref 6–29)
AST: 37 U/L — AB (ref 10–35)
Albumin: 4.5 g/dL (ref 3.6–5.1)
Alkaline phosphatase (APISO): 67 U/L (ref 33–130)
BUN: 18 mg/dL (ref 7–25)
CO2: 29 mmol/L (ref 20–32)
Calcium: 9.9 mg/dL (ref 8.6–10.4)
Chloride: 100 mmol/L (ref 98–110)
Creat: 0.93 mg/dL (ref 0.60–0.93)
GFR, Est African American: 69 mL/min/{1.73_m2} (ref >=60)
GFR, Est Non African American: 60 mL/min/{1.73_m2} (ref >=60)
GLOBULIN: 2.3 g/dL (ref 1.9–3.7)
Glucose, Bld: 111 mg/dL — ABNORMAL HIGH (ref 65–99)
POTASSIUM: 4 mmol/L (ref 3.5–5.3)
SODIUM: 136 mmol/L (ref 135–146)
Total Bilirubin: 0.9 mg/dL (ref 0.2–1.2)
Total Protein: 6.8 g/dL (ref 6.1–8.1)

## 2017-12-11 LAB — HEMOGLOBIN A1C
HEMOGLOBIN A1C: 6.2 %{Hb} — AB (ref <5.7)
Mean Plasma Glucose: 131 (calc)
eAG (mmol/L): 7.3 (calc)

## 2017-12-11 LAB — LIPID PANEL
CHOL/HDL RATIO: 3.1 (calc) (ref <5.0)
CHOLESTEROL: 181 mg/dL (ref <200)
HDL: 59 mg/dL (ref >50)
LDL CHOLESTEROL (CALC): 101 mg/dL — AB
Non-HDL Cholesterol (Calc): 122 mg/dL (calc) (ref <130)
TRIGLYCERIDES: 115 mg/dL (ref <150)

## 2017-12-11 LAB — MICROALBUMIN / CREATININE URINE RATIO
Creatinine, Urine: 157 mg/dL (ref 20–275)
MICROALB/CREAT RATIO: 5 ug/mg{creat} (ref <30)
Microalb, Ur: 0.8 mg/dL

## 2017-12-11 LAB — TSH: TSH: 2.54 mIU/L (ref 0.40–4.50)

## 2017-12-13 ENCOUNTER — Encounter: Payer: Medicare Other | Admitting: Internal Medicine

## 2017-12-16 ENCOUNTER — Ambulatory Visit (INDEPENDENT_AMBULATORY_CARE_PROVIDER_SITE_OTHER): Payer: Medicare Other | Admitting: Internal Medicine

## 2017-12-16 ENCOUNTER — Encounter: Payer: Self-pay | Admitting: Internal Medicine

## 2017-12-16 VITALS — BP 138/70 | HR 86 | Ht 65.0 in | Wt 174.0 lb

## 2017-12-16 DIAGNOSIS — I10 Essential (primary) hypertension: Secondary | ICD-10-CM

## 2017-12-16 DIAGNOSIS — E039 Hypothyroidism, unspecified: Secondary | ICD-10-CM | POA: Diagnosis not present

## 2017-12-16 DIAGNOSIS — G4709 Other insomnia: Secondary | ICD-10-CM

## 2017-12-16 DIAGNOSIS — F418 Other specified anxiety disorders: Secondary | ICD-10-CM

## 2017-12-16 DIAGNOSIS — R7302 Impaired glucose tolerance (oral): Secondary | ICD-10-CM | POA: Diagnosis not present

## 2017-12-16 DIAGNOSIS — I5189 Other ill-defined heart diseases: Secondary | ICD-10-CM

## 2017-12-16 DIAGNOSIS — Z0000 Encounter for general adult medical examination without abnormal findings: Secondary | ICD-10-CM | POA: Diagnosis not present

## 2017-12-16 DIAGNOSIS — M19041 Primary osteoarthritis, right hand: Secondary | ICD-10-CM

## 2017-12-16 DIAGNOSIS — E8881 Metabolic syndrome: Secondary | ICD-10-CM

## 2017-12-16 DIAGNOSIS — M19042 Primary osteoarthritis, left hand: Secondary | ICD-10-CM

## 2017-12-16 DIAGNOSIS — K5900 Constipation, unspecified: Secondary | ICD-10-CM

## 2017-12-16 NOTE — Progress Notes (Signed)
Subjective:    Patient ID: Sharon Peters, female    DOB: 1940-08-10, 77 y.o.   MRN: [id number]  HPI 77 year old Female for Medicare wellness, health maintenance exam, and evaluation of medical issues.  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  Continued issues with constipation. Discussed management again.  May need to see Dr. Cristina Gong once again.  Used to be on Linzess by Dr. Cristina Gong but now takes MiraLAX.  Had colonoscopy April 2017 which was normal except for hyperplastic polyp.  History of chronic kidney disease followed by nephrologist.  This has improved over the past 18 months.  In June 2018 it was 1.31 and is now 0.93.  Creatinine improved when ARB was discontinued.  Continues to complain bitterly of osteoarthritis of her hands and takes tramadol and uses Voltaren gel.  History of left ventricular diastolic dysfunction, controlled type 2 diabetes mellitus, hypertension, hyperlipidemia, metabolic syndrome and hypothyroidism.  Past medical history: Left knee arthroscopic surgery around 1987.  Tubal ligation around 1979.  In 2012 was diagnosed with probable meniscal tear of the left knee injected with steroids.  Has been diagnosed in the past with trochanteric bursitis of the left hip.  Social history: Is divorced and resides alone.  2 adult children a son and a daughter.  Is retired from [employer].  Does not smoke.  Rare alcohol consumption.  Family history: Brother with history of liver cancer secondary to hepatitis B.  Father died at age 9 due to pancreatic cancer.  Mother died at age 6 of lung cancer.  3 brothers and 1 sister.  Review of Systems  Constitutional: Positive for fatigue.  HENT: Negative.   Respiratory: Negative.   Cardiovascular: Negative.   Gastrointestinal:       Chronic constipation  Genitourinary: Negative.   Musculoskeletal: Positive for arthralgias.  Neurological: Negative.     Psychiatric/Behavioral:       Chronically anxious       Objective:   Physical Exam Vitals signs reviewed.  Constitutional:      General: She is not in acute distress.    Appearance: Normal appearance. She is obese.  HENT:     Head: Normocephalic and atraumatic.     Right Ear: Tympanic membrane and ear canal normal.     Left Ear: Tympanic membrane and ear canal normal.     Nose: Nose normal.     Mouth/Throat:     Mouth: Mucous membranes are moist.     Pharynx: Oropharynx is clear.  Eyes:     General: No scleral icterus.    Conjunctiva/sclera: Conjunctivae normal.  Neck:     Musculoskeletal: Neck supple.     Comments: No thyromegaly Cardiovascular:     Rate and Rhythm: Normal rate and regular rhythm.     Pulses: Normal pulses.     Heart sounds: No murmur.     Comments: Breasts normal female without masses Pulmonary:     Effort: No respiratory distress.     Breath sounds: Normal breath sounds. No wheezing.  Abdominal:     General: Bowel sounds are normal.     Palpations: Abdomen is soft. There is no mass.     Tenderness: There is no abdominal tenderness. There is no rebound.  Musculoskeletal:     Right lower leg: No edema.     Left lower leg: No edema.  Lymphadenopathy:     Cervical: No cervical adenopathy.  Skin:  General: Skin is warm and dry.  Neurological:     General: No focal deficit present.     Mental Status: She is alert and oriented to person, place, and time.     Coordination: Coordination normal.  Psychiatric:        Mood and Affect: Mood normal.        Behavior: Behavior normal.        Thought Content: Thought content normal.        Judgment: Judgment normal.     Comments: Anxious over her health           Assessment & Plan:  Hypothyroidism-stable on thyroid replacement  Essential hypertension-stable on just amlodipine and HCTZ  History of stage III chronic kidney disease which improved to normal with discontinuance of ARB which had been  prescribed for hypertension  Osteoarthritis of hands and feet followed by rheumatologist.  Treated with tramadol and Voltaren gel.  History of impaired glucose tolerance treated with metformin and stable  Diastolic dysfunction  Obesity-discussed diet and exercise  Chronic constipation-likely needs to restart Linzess and should discuss with Dr. Cristina Gong  Insomnia-continue Ambien  Plan: Continue to encourage diet exercise and weight loss.  Continue current medications and return in 6 months.  Subjective:   Patient presents for Medicare Annual/Subsequent preventive examination.  Review Past Medical/Family/Social: See above   Risk Factors  Current exercise habits: Not a lot of physical exercise due to musculoskeletal complaints Dietary issues discussed: Low-fat low carbohydrate  Cardiac risk factors: Obesity, impaired glucose tolerance Depression Screen  (Note: if answer to either of the following is "Yes", a more complete depression screening is indicated)   Over the past two weeks, have you felt down, depressed or hopeless? No  Over the past two weeks, have you felt little interest or pleasure in doing things? No Have you lost interest or pleasure in daily life? No Do you often feel hopeless? No Do you cry easily over simple problems? No   Activities of Daily Living  In your present state of health, do you have any difficulty performing the following activities?:   Driving? No  Managing money? No  Feeding yourself? No  Getting from bed to chair? No  Climbing a flight of stairs? No  Preparing food and eating?: No  Bathing or showering? No  Getting dressed: No  Getting to the toilet? No  Using the toilet:No  Moving around from place to place: No  In the past year have you fallen or had a near fall?:No  Are you sexually active? No  Do you have more than one partner? No   Hearing Difficulties: No  Do you often ask people to speak up or repeat themselves? No  Do you  experience ringing or noises in your ears? No  Do you have difficulty understanding soft or whispered voices? No  Do you feel that you have a problem with memory? No Do you often misplace items? No    Home Safety:  Do you have a smoke alarm at your residence? Yes Do you have grab bars in the bathroom?  Yes Do you have throw rugs in your house?  In the bathroom   Cognitive Testing  Alert? Yes Normal Appearance?Yes  Oriented to person? Yes Place? Yes  Time? Yes  Recall of three objects? Yes  Can perform simple calculations? Yes  Displays appropriate judgment?Yes  Can read the correct time from a watch face?Yes   List the Names of Other Physician/Practitioners you currently  use:  See referral list for the physicians patient is currently seeing.  Rheumatologist  Dr. Belinda Block   Review of Systems: See above   Objective:     General appearance: Appears stated age and  obese  Head: Normocephalic, without obvious abnormality, atraumatic  Eyes: conj clear, EOMi PEERLA  Ears: normal TM's and external ear canals both ears  Nose: Nares normal. Septum midline. Mucosa normal. No drainage or sinus tenderness.  Throat: lips, mucosa, and tongue normal; teeth and gums normal  Neck: no adenopathy, no carotid bruit, no JVD, supple, symmetrical, trachea midline and thyroid not enlarged, symmetric, no tenderness/mass/nodules  No CVA tenderness.  Lungs: clear to auscultation bilaterally  Breasts: normal appearance, no masses or tenderness Heart: regular rate and rhythm, S1, S2 normal, no murmur, click, rub or gallop  Abdomen: soft, non-tender; bowel sounds normal; no masses, no organomegaly  Musculoskeletal: ROM normal in all joints, no crepitus, no deformity, Normal muscle strengthen. Back  is symmetric, no curvature. Skin: Skin color, texture, turgor normal. No rashes or lesions  Lymph nodes: Cervical, supraclavicular, and axillary nodes normal.  Neurologic: CN 2 -12 Normal, Normal  symmetric reflexes. Normal coordination and gait  Psych: Alert & Oriented x 3, Mood appear stable.    Assessment:    Annual wellness medicare exam   Plan:    During the course of the visit the patient was educated and counseled about appropriate screening and preventive services including:  Reminded regarding annual flu vaccine and mammogram      Patient Instructions (the written plan) was given to the patient.  Medicare Attestation  I have personally reviewed:  The patient's medical and social history  Their use of alcohol, tobacco or illicit drugs  Their current medications and supplements  The patient's functional ability including ADLs,fall risks, home safety risks, cognitive, and hearing and visual impairment  Diet and physical activities  Evidence for depression or mood disorders  The patient's weight, height, BMI, and visual acuity have been recorded in the chart. I have made referrals, counseling, and provided education to the patient based on review of the above and I have provided the patient with a written personalized care plan for preventive services.

## 2017-12-16 NOTE — Patient Instructions (Signed)
Have mammogram. Speak with Dr. Cristina Gong regarding constipation. Pt to consider Shingrix vaccine. Continue same meds and return in 6 months.

## 2017-12-27 ENCOUNTER — Other Ambulatory Visit: Payer: Self-pay | Admitting: Internal Medicine

## 2017-12-28 ENCOUNTER — Other Ambulatory Visit: Payer: Self-pay | Admitting: Internal Medicine

## 2018-01-11 ENCOUNTER — Encounter: Payer: Self-pay | Admitting: Internal Medicine

## 2018-01-23 ENCOUNTER — Telehealth: Payer: Self-pay | Admitting: Podiatry

## 2018-01-23 NOTE — Telephone Encounter (Signed)
I was calling to see if I could come in and get two toe separators as I lost mine.

## 2018-01-23 NOTE — Telephone Encounter (Signed)
I informed pt she could come by the [facility] office and pick from the sample sheet and purchase from the front reception desk.

## 2018-01-28 ENCOUNTER — Telehealth: Payer: Self-pay | Admitting: Internal Medicine

## 2018-01-28 NOTE — Telephone Encounter (Signed)
She's calling to request a refill on the Diclofenac Sodium 1% Gel.    She saw [facility] Rheumatology Leafy Kindle); they will not be seeing her any longer unless she has problems.  [facility] could not (Dr. Amedeo Plenty) help her with the fitting of any type of glove.  He told her there was really nothing to do.  He told her that the glove would not help her, your hands are going to do what they are going to do at this age.  So, he referred her back to you to prescribe the gel that she has been using for the refill.    Pharmacy:  [facility].    Phone:  [phone]  Thank you.

## 2018-01-28 NOTE — Telephone Encounter (Signed)
Refill Voltaren gel x one year

## 2018-01-29 MED ORDER — DICLOFENAC SODIUM 1 % TD GEL: 1.0000 g | Freq: Three times a day (TID) | TRANSDERMAL | 11 refills | Status: DC

## 2018-01-31 ENCOUNTER — Other Ambulatory Visit: Payer: Self-pay

## 2018-01-31 MED ORDER — DICLOFENAC SODIUM 1 % TD GEL: 2.0000 g | Freq: Three times a day (TID) | TRANSDERMAL | 3 refills | Status: DC

## 2018-02-09 IMAGING — US US RENAL
1 series · 14 of 25 positions shown · non-contrast
Comparison: None.

CLINICAL DATA: Elevated creatinine level

EXAM:
RENAL / URINARY TRACT ULTRASOUND COMPLETE

[Series 1: us renal · 0.23mm/px · 14 of 43 slices shown]
[im 1/43]
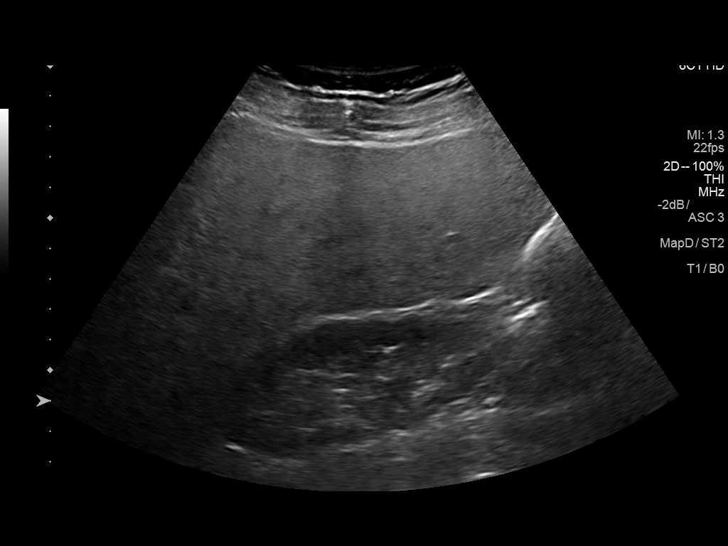
[im 4/43]
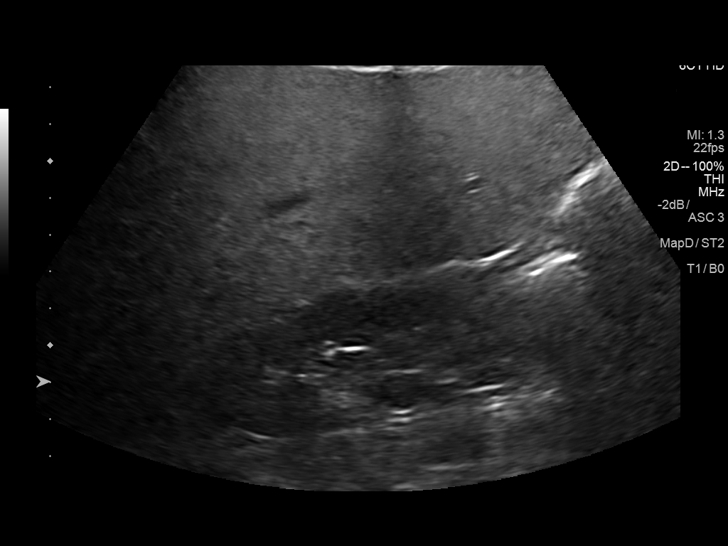
[im 8/43]
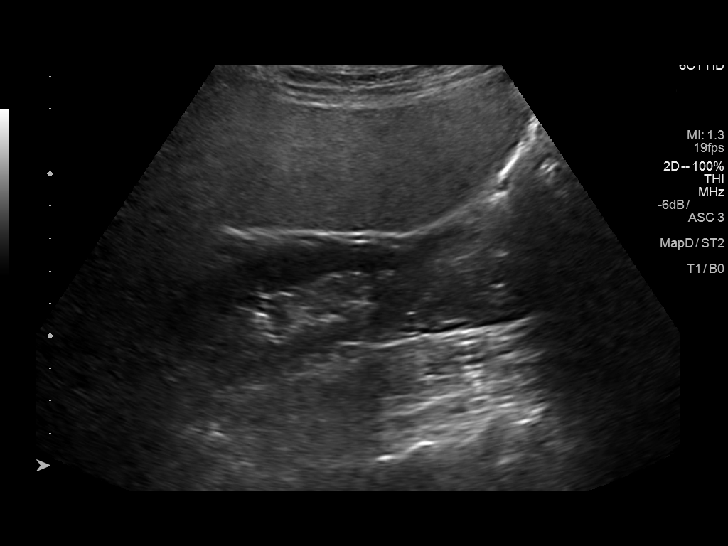
[im 11/43]
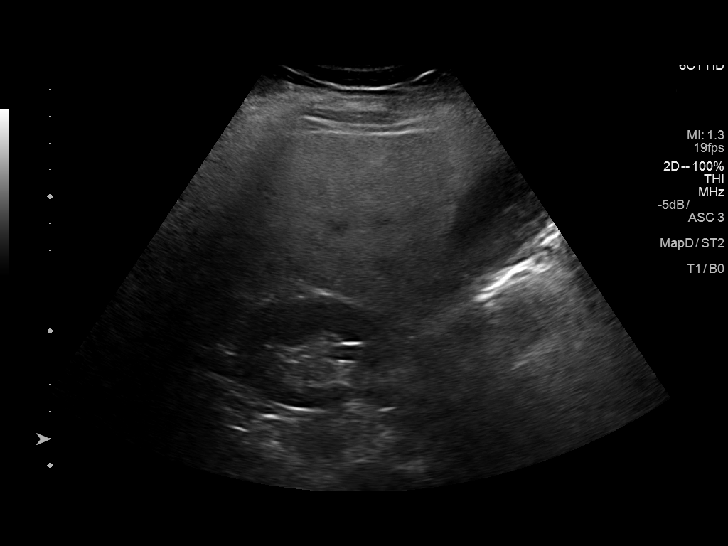
[im 15/43]
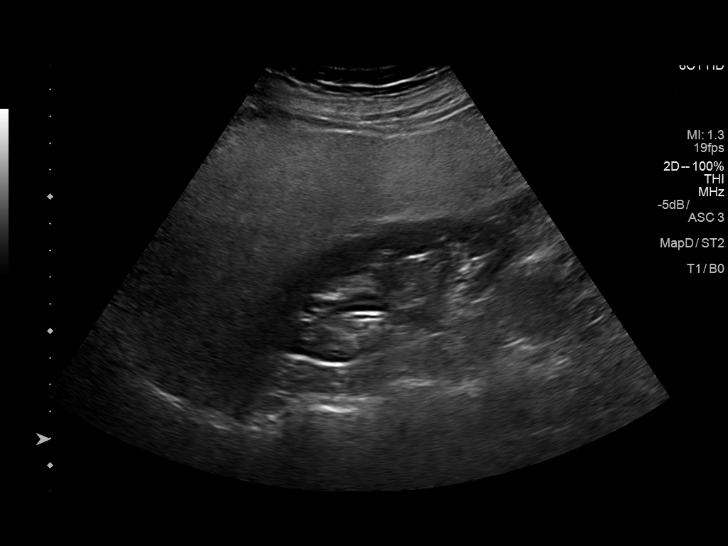
[im 16/43]
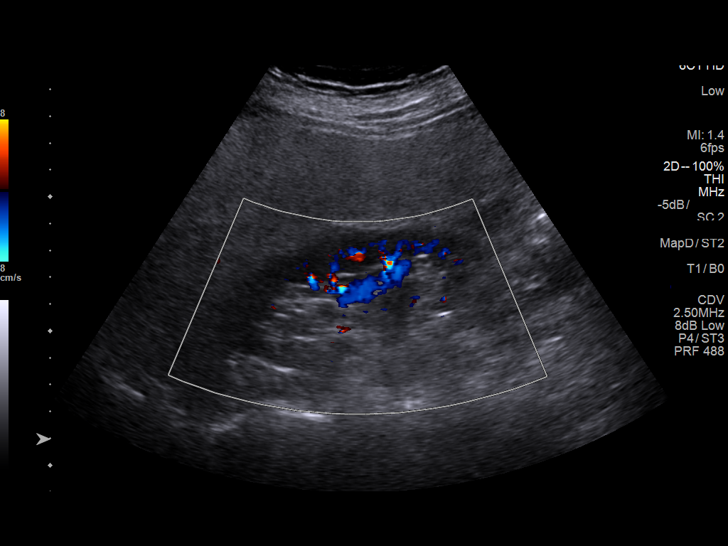
[im 20/43]
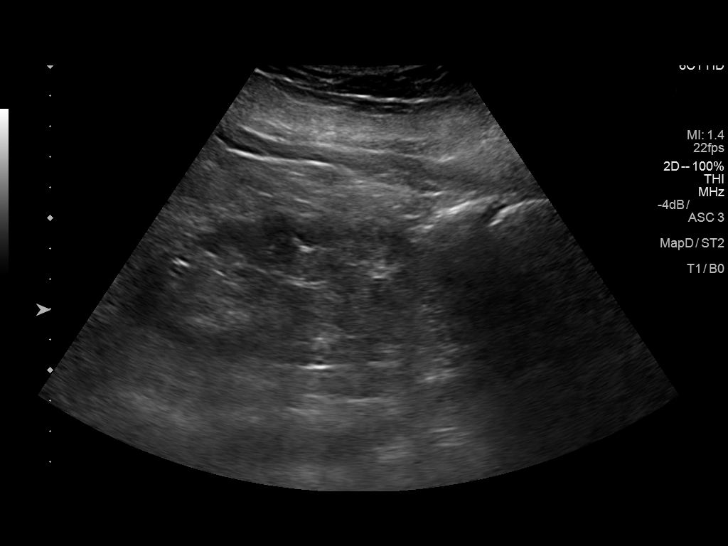
[im 23/43]
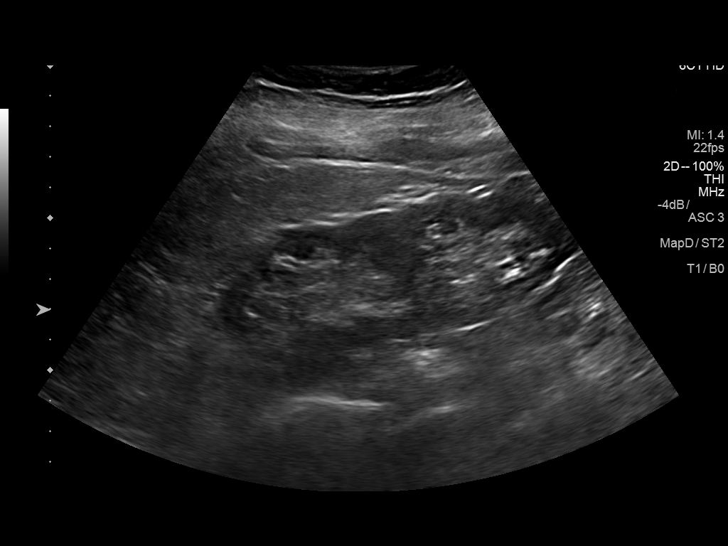
[im 27/43]
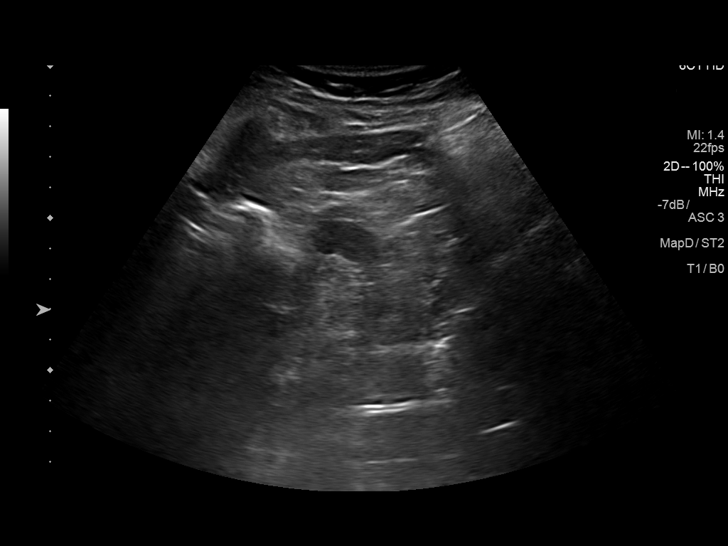
[im 29/43]
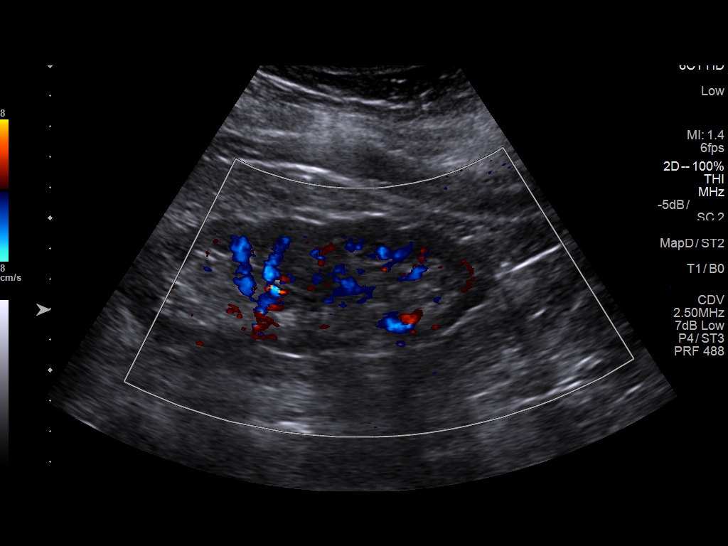
[im 32/43]
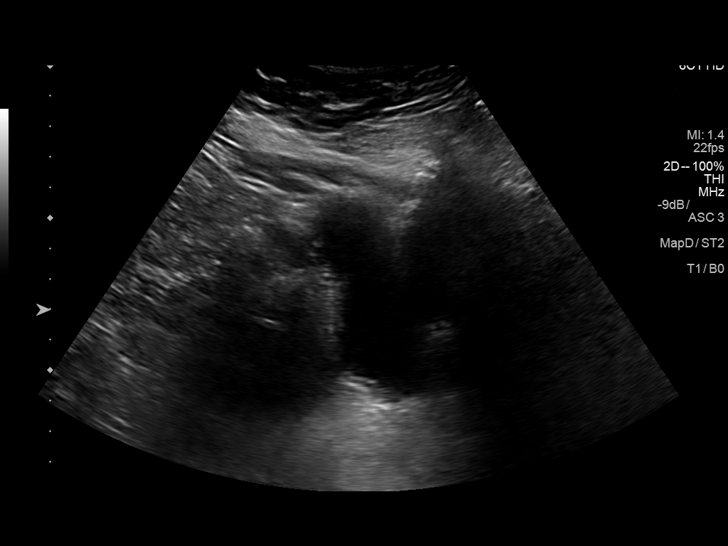
[im 36/43]
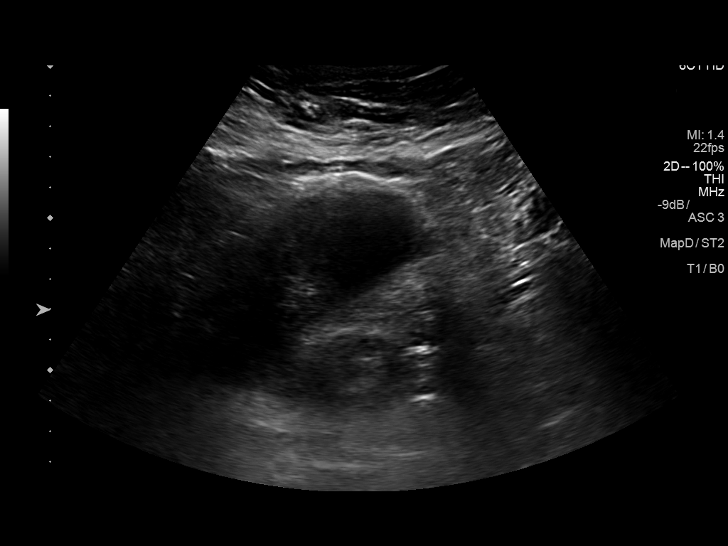
[im 39/43]
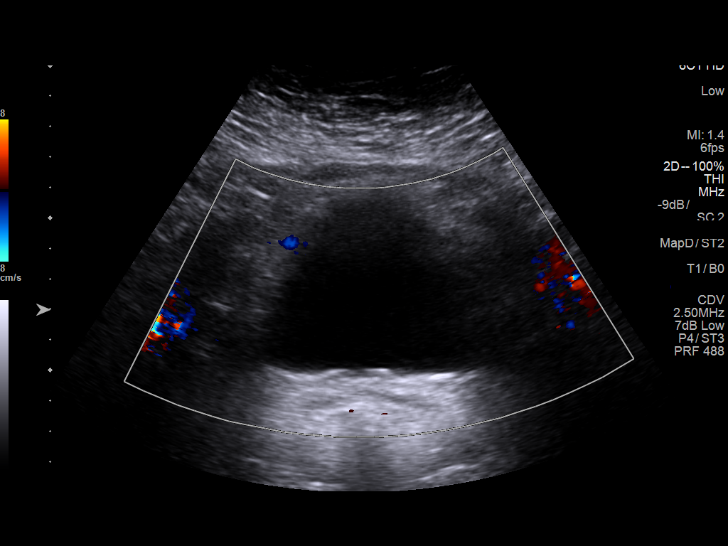
[im 43/43]
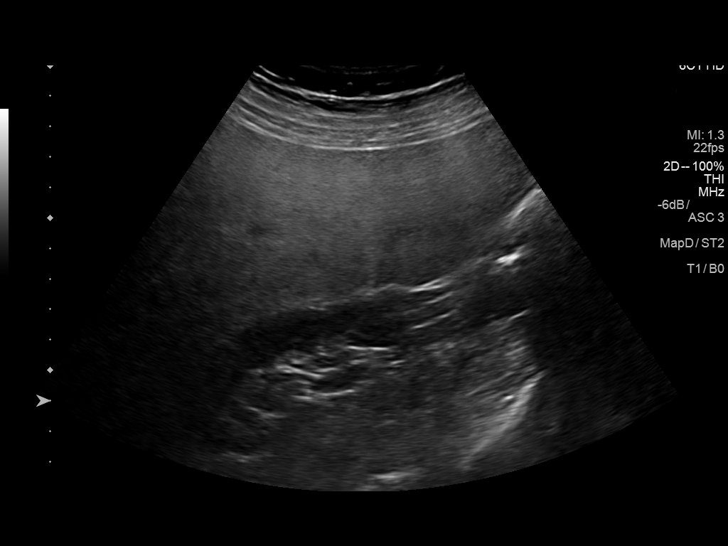

[14 of 25 positions shown; findings below may reference images not displayed]

FINDINGS: Right Kidney:

Length: 9.9 cm.. No definite hydronephrosis is seen. Very minimal
fullness of the right pelvis is noted.

Left Kidney:

Length: 12.4 cm..  No hydronephrosis is seen.

Bladder:

The urinary bladder is not well distended but is grossly
unremarkable.
IMPRESSION: No hydronephrosis. Very minimal fullness of the right renal pelvis
of questionable significance.

## 2018-03-31 ENCOUNTER — Other Ambulatory Visit: Payer: Self-pay

## 2018-03-31 ENCOUNTER — Other Ambulatory Visit: Payer: Self-pay | Admitting: Internal Medicine

## 2018-03-31 MED ORDER — LEVOTHYROXINE SODIUM 75 MCG PO TABS: ORAL_TABLET | ORAL | 1 refills | Status: DC

## 2018-03-31 MED ORDER — HYDROCHLOROTHIAZIDE 25 MG PO TABS: 25.0000 mg | ORAL_TABLET | Freq: Every day | ORAL | 1 refills | Status: DC

## 2018-03-31 MED ORDER — METFORMIN HCL 500 MG PO TABS: ORAL_TABLET | ORAL | 1 refills | Status: DC

## 2018-03-31 MED ORDER — TRAMADOL HCL 50 MG PO TABS: ORAL_TABLET | ORAL | 0 refills | Status: DC

## 2018-03-31 NOTE — Telephone Encounter (Signed)
Patient is calling for a refill. She said the pharmacist told her she can only get this rx filled once at a time no with no refills.

## 2018-06-17 ENCOUNTER — Other Ambulatory Visit: Payer: Medicare Other | Admitting: Internal Medicine

## 2018-06-17 DIAGNOSIS — R7302 Impaired glucose tolerance (oral): Secondary | ICD-10-CM

## 2018-06-17 DIAGNOSIS — E039 Hypothyroidism, unspecified: Secondary | ICD-10-CM

## 2018-06-18 LAB — HEMOGLOBIN A1C
Hgb A1c MFr Bld: 6.1 % of total Hgb — ABNORMAL HIGH (ref <5.7)
Mean Plasma Glucose: 128 (calc)
eAG (mmol/L): 7.1 (calc)

## 2018-06-18 LAB — MICROALBUMIN / CREATININE URINE RATIO
Creatinine, Urine: 234 mg/dL (ref 20–275)
Microalb Creat Ratio: 4 mcg/mg creat (ref <30)
Microalb, Ur: 0.9 mg/dL

## 2018-06-18 LAB — TSH: TSH: 1.33 mIU/L (ref 0.40–4.50)

## 2018-06-19 ENCOUNTER — Ambulatory Visit (INDEPENDENT_AMBULATORY_CARE_PROVIDER_SITE_OTHER): Payer: Medicare Other | Admitting: Internal Medicine

## 2018-06-19 ENCOUNTER — Encounter: Payer: Self-pay | Admitting: Internal Medicine

## 2018-06-19 VITALS — BP 160/70 | HR 80 | Ht 65.0 in | Wt 174.0 lb

## 2018-06-19 DIAGNOSIS — R7302 Impaired glucose tolerance (oral): Secondary | ICD-10-CM

## 2018-06-19 DIAGNOSIS — E039 Hypothyroidism, unspecified: Secondary | ICD-10-CM

## 2018-06-19 DIAGNOSIS — E8881 Metabolic syndrome: Secondary | ICD-10-CM | POA: Diagnosis not present

## 2018-06-19 DIAGNOSIS — R7989 Other specified abnormal findings of blood chemistry: Secondary | ICD-10-CM

## 2018-06-19 DIAGNOSIS — I10 Essential (primary) hypertension: Secondary | ICD-10-CM | POA: Diagnosis not present

## 2018-06-19 DIAGNOSIS — F411 Generalized anxiety disorder: Secondary | ICD-10-CM

## 2018-06-19 NOTE — Progress Notes (Signed)
   Subjective:    Patient ID: Sharon Peters, female    DOB: [birth year], 78 y.o.   MRN: [id number]  HPI 78 year old female in today for 89-month recheck.  She has a history of chronic kidney disease, impaired glucose tolerance, and hypothyroidism.  TSH is normal.  Hemoglobin A1c stable at 6.1%.  Creatinine is stable at 1.09.  Potassium 3.5.  Creatinine improved when arm was discontinued.  History of hyperlipidemia, hypertension, hypothyroidism.  No new complaints.  She is pleased with her lab results.    Review of Systems see above     Objective:   Physical Exam  Vital signs reviewed.  Blood pressure is 160/70 today.  Weight 174 pounds.  Pulse 80 and regular.  Skin warm and dry.  Chest clear.  Cardiac exam regular rate and rhythm.  Extremities without edema.      Assessment & Plan:  Anxiety state-likely related to pandemic.  Continue to monitor blood pressure and call if persistently elevated.  Hypothyroidism-stable on thyroid replacement therapy.  Continue current dose.  Impaired glucose tolerance-hemoglobin A1c stable at 6.1%.  History of elevated creatinine-creatinine is stable at 1.09.  Plan: Return in 6 months for health maintenance exam and evaluation of medical issues but watch blood pressure on a regular basis and call if persistently elevated.

## 2018-06-20 LAB — BASIC METABOLIC PANEL
BUN/Creatinine Ratio: 23 (calc) — ABNORMAL HIGH (ref 6–22)
BUN: 25 mg/dL (ref 7–25)
CO2: 26 mmol/L (ref 20–32)
Calcium: 10.2 mg/dL (ref 8.6–10.4)
Chloride: 97 mmol/L — ABNORMAL LOW (ref 98–110)
Creat: 1.09 mg/dL — ABNORMAL HIGH (ref 0.60–0.93)
Glucose, Bld: 117 mg/dL — ABNORMAL HIGH (ref 65–99)
Potassium: 3.5 mmol/L (ref 3.5–5.3)
Sodium: 136 mmol/L (ref 135–146)

## 2018-06-30 ENCOUNTER — Other Ambulatory Visit: Payer: Self-pay

## 2018-06-30 ENCOUNTER — Telehealth: Payer: Self-pay | Admitting: Internal Medicine

## 2018-06-30 ENCOUNTER — Encounter: Payer: Self-pay | Admitting: Internal Medicine

## 2018-06-30 MED ORDER — TRAMADOL HCL 50 MG PO TABS: ORAL_TABLET | ORAL | 0 refills | Status: DC

## 2018-06-30 NOTE — Telephone Encounter (Signed)
Refill Tramadol 50 mg tabs #90 with no refills

## 2018-06-30 NOTE — Telephone Encounter (Signed)
Have refilled

## 2018-07-12 NOTE — Patient Instructions (Signed)
Monitor blood pressure and call if persistently elevated.  Continue current medications and follow-up in 6 months.

## 2018-07-31 ENCOUNTER — Encounter: Payer: Self-pay | Admitting: Podiatry

## 2018-07-31 ENCOUNTER — Ambulatory Visit (INDEPENDENT_AMBULATORY_CARE_PROVIDER_SITE_OTHER): Payer: Medicare Other | Admitting: Podiatry

## 2018-07-31 ENCOUNTER — Ambulatory Visit (INDEPENDENT_AMBULATORY_CARE_PROVIDER_SITE_OTHER): Payer: Medicare Other

## 2018-07-31 ENCOUNTER — Other Ambulatory Visit: Payer: Self-pay

## 2018-07-31 VITALS — Temp 97.9°F

## 2018-07-31 DIAGNOSIS — M779 Enthesopathy, unspecified: Secondary | ICD-10-CM

## 2018-07-31 DIAGNOSIS — M7741 Metatarsalgia, right foot: Secondary | ICD-10-CM

## 2018-07-31 DIAGNOSIS — M7751 Other enthesopathy of right foot: Secondary | ICD-10-CM

## 2018-08-11 NOTE — Progress Notes (Signed)
Subjective: 78 year old female presents the office for concerns of pain to the ball of her right foot is been ongoing for last several months.  She states that she has noticed some mild swelling to the area.  This is intermittent.  She is diabetic and also A1c was 6.2.  She denies any recent injury or trauma. Denies any systemic complaints such as fevers, chills, nausea, vomiting. No acute changes since last appointment, and no other complaints at this time.   Objective: AAO x3, NAD DP/PT pulses palpable bilaterally, CRT less than 3 seconds There is tenderness on the submetatarsal area of the right foot.  There is no area pinpoint tenderness.  No pain with MPJ range of motion.  No pain the metatarsals.  No neuroma palpable today.  Minimal edema mostly submetatarsal 2 area. No open lesions or pre-ulcerative lesions.  No pain with calf compression, swelling, warmth, erythema  Assessment: Metatarsalgia/capsulitis right  Plan: -All treatment options discussed with the patient including all alternatives, risks, complications.  -X-rays were obtained reviewed.  No evidence of acute fracture. -Discussed injection. -Metatarsal offloading pads.  Discussed with him orthotics. -Patient encouraged to call the office with any questions, concerns, change in symptoms.   Trula Slade DPM

## 2018-08-28 ENCOUNTER — Other Ambulatory Visit: Payer: Self-pay

## 2018-08-28 ENCOUNTER — Ambulatory Visit (INDEPENDENT_AMBULATORY_CARE_PROVIDER_SITE_OTHER): Payer: Medicare Other | Admitting: Podiatry

## 2018-08-28 VITALS — Temp 97.2°F

## 2018-08-28 DIAGNOSIS — M7741 Metatarsalgia, right foot: Secondary | ICD-10-CM

## 2018-08-28 DIAGNOSIS — M779 Enthesopathy, unspecified: Secondary | ICD-10-CM | POA: Diagnosis not present

## 2018-09-07 NOTE — Progress Notes (Signed)
Subjective: 78 year old female presents today for Evaluation of right foot pain.  She states it started to feel better however the pain started to come back.  Her pain level is 5/10 at times.  No recent injury or falls.  No swelling or redness.  She states is not abnormal for steroid injection at this time. Denies any systemic complaints such as fevers, chills, nausea, vomiting. No acute changes since last appointment, and no other complaints at this time.   Objective: AAO x3, NAD DP/PT pulses palpable bilaterally, CRT less than 3 seconds There is tenderness palpation to the midfoot central area there is localized edema there is no erythema or warmth.  No fluctuation crepitation.  Tenderness only to the plantar aspect metatarsals there is pain to the dorsal metatarsals.  No pain with drainage motion. No open lesions or pre-ulcerative lesions.  No pain with calf compression, swelling, warmth, erythema  Assessment: Metatarsalgia right foot  Plan: -All treatment options discussed with the patient including all alternatives, risks, complications.  -She wants to hold off on a steroid injection today.  I dispensed various offloading pads.  She will use Voltaren gel as needed.  Discussed shoe modifications. -Patient encouraged to call the office with any questions, concerns, change in symptoms.   Trula Slade DPM

## 2018-09-23 ENCOUNTER — Telehealth: Payer: Self-pay | Admitting: Internal Medicine

## 2018-09-23 MED ORDER — DICLOFENAC SODIUM 1 % TD GEL: 2.0000 g | Freq: Three times a day (TID) | TRANSDERMAL | 3 refills | Status: DC

## 2018-09-23 NOTE — Telephone Encounter (Signed)
Received Fax RX request from  [facility]  Medication - diclofenac sodium (VOLTAREN) 1 % GEL   Last Refill - 06/28/18  Last OV - 06/19/18  Last CPE - 12/16/17  Next CPE scheduled for 12/22/18

## 2018-09-25 ENCOUNTER — Telehealth: Payer: Self-pay | Admitting: Internal Medicine

## 2018-09-25 ENCOUNTER — Ambulatory Visit: Payer: Medicare Other | Admitting: Podiatry

## 2018-09-25 MED ORDER — METFORMIN HCL 500 MG PO TABS: ORAL_TABLET | ORAL | 1 refills | Status: DC

## 2018-09-25 MED ORDER — LEVOTHYROXINE SODIUM 75 MCG PO TABS: ORAL_TABLET | ORAL | 1 refills | Status: DC

## 2018-09-25 NOTE — Telephone Encounter (Signed)
Received Fax RX request from  [facility]  Medication - metFORMIN (GLUCOPHAGE) 500 MG tablet  levothyroxine (SYNTHROID, LEVOTHROID) 75 MCG tablet   Last Refill - 06/27/18  Last OV - 06/19/18  Last CPE - 12/16/17

## 2018-09-30 ENCOUNTER — Other Ambulatory Visit: Payer: Self-pay

## 2018-09-30 MED ORDER — HYDROCHLOROTHIAZIDE 25 MG PO TABS: 25.0000 mg | ORAL_TABLET | Freq: Every day | ORAL | 1 refills | Status: DC

## 2018-10-02 ENCOUNTER — Other Ambulatory Visit: Payer: Self-pay

## 2018-10-02 MED ORDER — TRAMADOL HCL 50 MG PO TABS: ORAL_TABLET | ORAL | 0 refills | Status: DC

## 2018-10-02 NOTE — Telephone Encounter (Signed)
Last refill 06/30/18 Last OV 06/19/18 CPE scheduled in December.

## 2018-12-18 NOTE — Addendum Note (Signed)
Addended by: Mady Haagensen on: 12/18/2018 05:17 PM   Modules accepted: Orders

## 2018-12-19 ENCOUNTER — Other Ambulatory Visit: Payer: Medicare Other | Admitting: Internal Medicine

## 2018-12-19 ENCOUNTER — Other Ambulatory Visit: Payer: Self-pay

## 2018-12-19 DIAGNOSIS — Z0000 Encounter for general adult medical examination without abnormal findings: Secondary | ICD-10-CM

## 2018-12-19 DIAGNOSIS — R4589 Other symptoms and signs involving emotional state: Secondary | ICD-10-CM

## 2018-12-19 DIAGNOSIS — G4709 Other insomnia: Secondary | ICD-10-CM

## 2018-12-19 DIAGNOSIS — E039 Hypothyroidism, unspecified: Secondary | ICD-10-CM

## 2018-12-19 DIAGNOSIS — F418 Other specified anxiety disorders: Secondary | ICD-10-CM

## 2018-12-19 DIAGNOSIS — R7302 Impaired glucose tolerance (oral): Secondary | ICD-10-CM

## 2018-12-19 DIAGNOSIS — E8881 Metabolic syndrome: Secondary | ICD-10-CM

## 2018-12-19 DIAGNOSIS — I10 Essential (primary) hypertension: Secondary | ICD-10-CM

## 2018-12-19 DIAGNOSIS — I5189 Other ill-defined heart diseases: Secondary | ICD-10-CM

## 2018-12-19 DIAGNOSIS — R8290 Unspecified abnormal findings in urine: Secondary | ICD-10-CM

## 2018-12-19 DIAGNOSIS — M19041 Primary osteoarthritis, right hand: Secondary | ICD-10-CM

## 2018-12-20 LAB — CBC WITH DIFFERENTIAL/PLATELET
Absolute Monocytes: 695 cells/uL (ref 200–950)
Basophils Absolute: 20 cells/uL (ref 0–200)
Basophils Relative: 0.4 %
Eosinophils Absolute: 90 cells/uL (ref 15–500)
Eosinophils Relative: 1.8 %
HCT: 37.1 % (ref 35.0–45.0)
Hemoglobin: 12.7 g/dL (ref 11.7–15.5)
Lymphs Abs: 1255 cells/uL (ref 850–3900)
MCH: 30 pg (ref 27.0–33.0)
MCHC: 34.2 g/dL (ref 32.0–36.0)
MCV: 87.7 fL (ref 80.0–100.0)
MPV: 11.2 fL (ref 7.5–12.5)
Monocytes Relative: 13.9 %
Neutro Abs: 2940 cells/uL (ref 1500–7800)
Neutrophils Relative %: 58.8 %
Platelets: 222 10*3/uL (ref 140–400)
RBC: 4.23 10*6/uL (ref 3.80–5.10)
RDW: 12.5 % (ref 11.0–15.0)
Total Lymphocyte: 25.1 %
WBC: 5 10*3/uL (ref 3.8–10.8)

## 2018-12-20 LAB — HEMOGLOBIN A1C
Hgb A1c MFr Bld: 6.2 % of total Hgb — ABNORMAL HIGH (ref <5.7)
Mean Plasma Glucose: 131 (calc)
eAG (mmol/L): 7.3 (calc)

## 2018-12-20 LAB — TSH: TSH: 2.49 mIU/L (ref 0.40–4.50)

## 2018-12-20 LAB — LIPID PANEL
Cholesterol: 193 mg/dL (ref <200)
HDL: 62 mg/dL (ref >=50)
LDL Cholesterol (Calc): 106 mg/dL (calc) — ABNORMAL HIGH
Non-HDL Cholesterol (Calc): 131 mg/dL (calc) — ABNORMAL HIGH (ref <130)
Total CHOL/HDL Ratio: 3.1 (calc) (ref <5.0)
Triglycerides: 132 mg/dL (ref <150)

## 2018-12-20 LAB — COMPLETE METABOLIC PANEL WITH GFR
AG Ratio: 1.7 (calc) (ref 1.0–2.5)
ALT: 25 U/L (ref 6–29)
AST: 31 U/L (ref 10–35)
Albumin: 4.5 g/dL (ref 3.6–5.1)
Alkaline phosphatase (APISO): 70 U/L (ref 37–153)
BUN/Creatinine Ratio: 16 (calc) (ref 6–22)
BUN: 15 mg/dL (ref 7–25)
CO2: 27 mmol/L (ref 20–32)
Calcium: 9.6 mg/dL (ref 8.6–10.4)
Chloride: 95 mmol/L — ABNORMAL LOW (ref 98–110)
Creat: 0.96 mg/dL — ABNORMAL HIGH (ref 0.60–0.93)
GFR, Est African American: 66 mL/min/{1.73_m2} (ref >=60)
GFR, Est Non African American: 57 mL/min/{1.73_m2} — ABNORMAL LOW (ref >=60)
Globulin: 2.7 g/dL (calc) (ref 1.9–3.7)
Glucose, Bld: 117 mg/dL — ABNORMAL HIGH (ref 65–99)
Potassium: 3.3 mmol/L — ABNORMAL LOW (ref 3.5–5.3)
Sodium: 132 mmol/L — ABNORMAL LOW (ref 135–146)
Total Bilirubin: 0.9 mg/dL (ref 0.2–1.2)
Total Protein: 7.2 g/dL (ref 6.1–8.1)

## 2018-12-22 ENCOUNTER — Encounter: Payer: Self-pay | Admitting: Internal Medicine

## 2018-12-22 ENCOUNTER — Other Ambulatory Visit: Payer: Self-pay

## 2018-12-22 ENCOUNTER — Ambulatory Visit (INDEPENDENT_AMBULATORY_CARE_PROVIDER_SITE_OTHER): Payer: Medicare Other | Admitting: Internal Medicine

## 2018-12-22 VITALS — BP 150/60 | HR 78 | Temp 98.2°F | Ht 65.0 in | Wt 179.0 lb

## 2018-12-22 DIAGNOSIS — R7989 Other specified abnormal findings of blood chemistry: Secondary | ICD-10-CM

## 2018-12-22 DIAGNOSIS — Z6829 Body mass index (BMI) 29.0-29.9, adult: Secondary | ICD-10-CM

## 2018-12-22 DIAGNOSIS — E039 Hypothyroidism, unspecified: Secondary | ICD-10-CM | POA: Diagnosis not present

## 2018-12-22 DIAGNOSIS — R030 Elevated blood-pressure reading, without diagnosis of hypertension: Secondary | ICD-10-CM

## 2018-12-22 DIAGNOSIS — I10 Essential (primary) hypertension: Secondary | ICD-10-CM

## 2018-12-22 DIAGNOSIS — G4709 Other insomnia: Secondary | ICD-10-CM

## 2018-12-22 DIAGNOSIS — F411 Generalized anxiety disorder: Secondary | ICD-10-CM

## 2018-12-22 DIAGNOSIS — K5901 Slow transit constipation: Secondary | ICD-10-CM

## 2018-12-22 DIAGNOSIS — M19042 Primary osteoarthritis, left hand: Secondary | ICD-10-CM

## 2018-12-22 DIAGNOSIS — F418 Other specified anxiety disorders: Secondary | ICD-10-CM

## 2018-12-22 DIAGNOSIS — E8881 Metabolic syndrome: Secondary | ICD-10-CM

## 2018-12-22 DIAGNOSIS — I5189 Other ill-defined heart diseases: Secondary | ICD-10-CM

## 2018-12-22 DIAGNOSIS — Z0000 Encounter for general adult medical examination without abnormal findings: Secondary | ICD-10-CM | POA: Diagnosis not present

## 2018-12-22 DIAGNOSIS — N1831 Chronic kidney disease, stage 3a: Secondary | ICD-10-CM

## 2018-12-22 DIAGNOSIS — R7302 Impaired glucose tolerance (oral): Secondary | ICD-10-CM

## 2018-12-22 DIAGNOSIS — M19041 Primary osteoarthritis, right hand: Secondary | ICD-10-CM

## 2018-12-22 LAB — POCT URINALYSIS DIPSTICK
Appearance: NEGATIVE
Bilirubin, UA: NEGATIVE
Blood, UA: NEGATIVE
Glucose, UA: NEGATIVE
Ketones, UA: NEGATIVE
Leukocytes, UA: NEGATIVE
Nitrite, UA: NEGATIVE
Odor: NEGATIVE
Protein, UA: NEGATIVE
Spec Grav, UA: 1.01 (ref 1.010–1.025)
Urobilinogen, UA: 0.2 E.U./dL
pH, UA: 7.5 (ref 5.0–8.0)

## 2018-12-22 MED ORDER — ZOLPIDEM TARTRATE 5 MG PO TABS: 5.0000 mg | ORAL_TABLET | Freq: Every evening | ORAL | 1 refills | Status: DC | PRN

## 2018-12-22 MED ORDER — POTASSIUM CHLORIDE CRYS ER 10 MEQ PO TBCR: 10.0000 meq | EXTENDED_RELEASE_TABLET | Freq: Two times a day (BID) | ORAL | 1 refills | Status: DC

## 2018-12-22 MED ORDER — TRAMADOL HCL 50 MG PO TABS: ORAL_TABLET | ORAL | 0 refills | Status: DC

## 2018-12-22 NOTE — Progress Notes (Signed)
Subjective:    Patient ID: Sharon Peters, female    DOB: 01-Mar-1940, 78 y.o.   MRN: [id number]  HPI 78 year old Female for health maintenance exam, medicare wellness and evaluation of medical issues.  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  She also has chronic kidney disease which is being watched.  She continues to have issues with constipation.  She did not like Linzess.  Has been taking smooth move but still does not have a good bowel movement on a daily basis.  Has appointment for follow-up with Dr. Cristina Gong later this week and she will discuss with him.  Has tried MiraLAX without much success.  She will be cutting back some on tramadol for hand arthritis and that may help issues.  Uses Voltaren gel for hand arthritis as needed and also takes tramadol which was refilled.  Urine drug screen obtained today as she is on chronic tramadol therapy.  Has seen rheumatologist regarding osteoarthritis of hands in the past.  History of chronic kidney disease which is improved.  In June 2018 creatinine was 1.31.  Creatinine improved with ARB was discontinued.  Creatinine is now 0.96.  6 months ago creatinine was 1.09 and 1 year ago 0.98.  History of impaired glucose tolerance/diabetes mellitus and hemoglobin A1c is 6.2%.  Fasting glucose 117.  With regard to hyperlipidemia LDL was stable at 106 with normal total cholesterol, HDL of 62 and triglycerides normal at 132.  Her potassium is slightly low at 3.3 and she will be given K-Dur 10 mEq daily with follow-up in 2 weeks.  Would like to have potassium around 4.  She is on a diuretic.  BMI is 29.  Reminded about diet exercise and weight loss.  Past medical history: History of left ventricular diastolic dysfunction, controlled type 2 diabetes mellitus, hypertension, hyperlipidemia metabolic  syndrome and hypothyroidism.  Past medical history: Left knee arthroscopic surgery around 1997.  Tubal ligation with 1979.  In 2012 was diagnosed with probable meniscal tear of the left knee injected with steroids.  Has been diagnosed in the past with trochanteric bursitis of the left hip.  Social history: She is divorced and resides alone.  2 adult children, son and a daughter.  He is retired from [employer].  Does not smoke.  Rare alcohol consumption.  Family history: Brother with history of liver cancer secondary to hepatitis B.  Father died at age 38 due to pancreatic cancer.  Mother died at age 55 of lung cancer.  3 brothers and 1 sister.    Review of Systems chronically anxious.  Chronic constipation.  Otherwise negative.     Objective:   Physical Exam Blood pressure 150/60.  Patient is to monitor at home.  Pulse 78.  Pulse oximetry 98%.  Weight 179 pounds.  BMI 29.79  Skin warm and dry.  Nodes none.  TMs are clear.  Neck is supple without JVD thyromegaly or carotid bruits.  Chest clear to auscultation.  Breast normal female without masses.  Abdomen no hepatosplenomegaly masses or tenderness.  No lower extremity edema.  Neuro no focal deficits.  Thought affect and judgment appear to be normal.       Assessment & Plan:  Elevated blood pressure reading-monitor at home and call me if persistently elevated otherwise continue current medication  Hypokalemia-start K-Dur 10 mEq daily and follow-up in 2 weeks with the minute  Impaired  glucose tolerance/diabetes mellitus-hemoglobin A1c stable at 6.2%.  Stable with Metformin.  Hyperlipidemia-stable  Metabolic syndrome  Hypothyroidism-TSH within normal limits on thyroid replacement  Essential hypertension-see above.  Continue amlodipine and HCTZ.  Insomnia treated with Ambien  Hand arthritis treated with tramadol and Voltaren gel  Chronic constipation to be discussed with Dr. Cristina Gong  Plan: Needs to have mammogram.   Follow-up here in 2 weeks with lab only-B-met. Follow-up in 6 months with hemoglobin A1c, TSH and b-met  Subjective:   Patient presents for Medicare Annual/Subsequent preventive examination.  Review Past Medical/Family/Social: See above goes to the grocery store but mostly stays and and is careful with COVID-19 exposure   Risk Factors  Current exercise habits: Not a lot of physical exercise Dietary issues discussed: Low-fat low carbohydrate discussed  Cardiac risk factors: Hyperlipidemia, impaired glucose tolerance/diabetes mellitus  Depression Screen  (Note: if answer to either of the following is "Yes", a more complete depression screening is indicated)   Over the past two weeks, have you felt down, depressed or hopeless? No  Over the past two weeks, have you felt little interest or pleasure in doing things? No Have you lost interest or pleasure in daily life? No Do you often feel hopeless? No Do you cry easily over simple problems? No   Activities of Daily Living  In your present state of health, do you have any difficulty performing the following activities?:   Driving? No  Managing money? No  Feeding yourself? No  Getting from bed to chair? No  Climbing a flight of stairs? No  Preparing food and eating?: No  Bathing or showering? No  Getting dressed: No  Getting to the toilet? No  Using the toilet:No  Moving around from place to place: No  In the past year have you fallen or had a near fall?:No  Are you sexually active? No  Do you have more than one partner? No   Hearing Difficulties: No  Do you often ask people to speak up or repeat themselves? No  Do you experience ringing or noises in your ears? No  Do you have difficulty understanding soft or whispered voices? No  Do you feel that you have a problem with memory? No Do you often misplace items? No    Home Safety:  Do you have a smoke alarm at your residence? Yes Do you have grab bars in the bathroom?  Yes  Do you have throw rugs in your house?  None   Cognitive Testing  Alert? Yes Normal Appearance?Yes  Oriented to person? Yes Place? Yes  Time? Yes  Recall of three objects? Yes  Can perform simple calculations? Yes  Displays appropriate judgment?Yes  Can read the correct time from a watch face?Yes   List the Names of Other Physician/Practitioners you currently use:  See referral list for the physicians patient is currently seeing.     Review of Systems: See above   Objective:     General appearance: Appears stated age and mildly obese  Head: Normocephalic, without obvious abnormality, atraumatic  Eyes: conj clear, EOMi PEERLA  Ears: normal TM's and external ear canals both ears  Nose: Nares normal. Septum midline. Mucosa normal. No drainage or sinus tenderness.  Throat: lips, mucosa, and tongue normal; teeth and gums normal  Neck: no adenopathy, no carotid bruit, no JVD, supple, symmetrical, trachea midline and thyroid not enlarged, symmetric, no tenderness/mass/nodules  No CVA tenderness.  Lungs: clear to auscultation bilaterally  Breasts: normal appearance, no masses or  tenderness Heart: regular rate and rhythm, S1, S2 normal, no murmur, click, rub or gallop  Abdomen: soft, non-tender; bowel sounds normal; no masses, no organomegaly  Musculoskeletal: ROM normal in all joints, no crepitus, no deformity, Normal muscle strengthen. Back  is symmetric, no curvature. Skin: Skin color, texture, turgor normal. No rashes or lesions  Lymph nodes: Cervical, supraclavicular, and axillary nodes normal.  Neurologic: CN 2 -12 Normal, Normal symmetric reflexes. Normal coordination and gait  Psych: Alert & Oriented x 3, Mood appear stable.    Assessment:    Annual wellness medicare exam   Plan:    During the course of the visit the patient was educated and counseled about appropriate screening and preventive services including:   Annual mammogram order placed     Patient  Instructions (the written plan) was given to the patient.  Medicare Attestation  I have personally reviewed:  The patient's medical and social history  Their use of alcohol, tobacco or illicit drugs  Their current medications and supplements  The patient's functional ability including ADLs,fall risks, home safety risks, cognitive, and hearing and visual impairment  Diet and physical activities  Evidence for depression or mood disorders  The patient's weight, height, BMI, and visual acuity have been recorded in the chart. I have made referrals, counseling, and provided education to the patient based on review of the above and I have provided the patient with a written personalized care plan for preventive services.

## 2018-12-22 NOTE — Patient Instructions (Addendum)
Take Tramadol sparingly. Ask Dr. Cristina Gong about constipation. Have refilled meds. Start potassium supplement 10 meq daily. Follow up with lab only in 2 weeks and see me in 6 moths with lab work.

## 2018-12-26 LAB — PAIN MGMT, PROFILE 8 W/CONF, U
6 Acetylmorphine: NEGATIVE ng/mL
Alcohol Metabolites: NEGATIVE ng/mL (ref <500)
Amphetamines: NEGATIVE ng/mL
Benzodiazepines: NEGATIVE ng/mL
Buprenorphine, Urine: NEGATIVE ng/mL
Cocaine Metabolite: NEGATIVE ng/mL
Creatinine: 66.5 mg/dL
MDMA: NEGATIVE ng/mL
Marijuana Metabolite: NEGATIVE ng/mL
Opiates: NEGATIVE ng/mL
Oxidant: NEGATIVE ug/mL
Oxycodone: NEGATIVE ng/mL
pH: 7.4 (ref 4.5–9.0)

## 2018-12-30 ENCOUNTER — Other Ambulatory Visit: Payer: Self-pay

## 2018-12-30 DIAGNOSIS — M19041 Primary osteoarthritis, right hand: Secondary | ICD-10-CM

## 2018-12-30 DIAGNOSIS — M19042 Primary osteoarthritis, left hand: Secondary | ICD-10-CM

## 2018-12-30 MED ORDER — ZOLPIDEM TARTRATE 5 MG PO TABS: 5.0000 mg | ORAL_TABLET | Freq: Every evening | ORAL | 1 refills | Status: DC | PRN

## 2019-01-06 ENCOUNTER — Other Ambulatory Visit: Payer: Self-pay

## 2019-01-06 ENCOUNTER — Other Ambulatory Visit: Payer: Medicare Other | Admitting: Internal Medicine

## 2019-01-06 DIAGNOSIS — R7989 Other specified abnormal findings of blood chemistry: Secondary | ICD-10-CM

## 2019-01-06 LAB — BASIC METABOLIC PANEL
BUN/Creatinine Ratio: 19 (calc) (ref 6–22)
BUN: 18 mg/dL (ref 7–25)
CO2: 28 mmol/L (ref 20–32)
Calcium: 9.7 mg/dL (ref 8.6–10.4)
Chloride: 97 mmol/L — ABNORMAL LOW (ref 98–110)
Creat: 0.97 mg/dL — ABNORMAL HIGH (ref 0.60–0.93)
Glucose, Bld: 112 mg/dL — ABNORMAL HIGH (ref 65–99)
Potassium: 4.2 mmol/L (ref 3.5–5.3)
Sodium: 135 mmol/L (ref 135–146)

## 2019-02-09 ENCOUNTER — Telehealth: Payer: Self-pay | Admitting: Internal Medicine

## 2019-02-09 NOTE — Telephone Encounter (Signed)
Sharon Peters [id number]  Sharon Peters called to check on status of prior authorization for her below medication. She stated the pharmacy told her it was denied. She would like to stay on this if possible.  zolpidem (AMBIEN) 5 MG tablet   [facility] [phone] - [place], [place] - [address] [place] Phone:  [phone]  Fax:  [phone]

## 2019-02-10 NOTE — Telephone Encounter (Signed)
PA was done and approved, approval form was sent to her pharmacy. Called patient to let he know of this.

## 2019-03-20 ENCOUNTER — Telehealth: Payer: Self-pay | Admitting: Internal Medicine

## 2019-03-20 DIAGNOSIS — M19041 Primary osteoarthritis, right hand: Secondary | ICD-10-CM

## 2019-03-20 MED ORDER — POTASSIUM CHLORIDE CRYS ER 10 MEQ PO TBCR: 10.0000 meq | EXTENDED_RELEASE_TABLET | Freq: Two times a day (BID) | ORAL | 1 refills | Status: DC

## 2019-03-20 NOTE — Telephone Encounter (Signed)
Received Fax RX request from  [facility] [place], [place] - [address] Phone:  [phone]  Fax:  [phone]       Medication - potassium chloride (KLOR-CON) 10 MEQ tablet   Last Refill - 02/02/19  Last OV - 12/22/18  Last CPE - 12/22/18  Next Appointment - 06/25/19

## 2019-03-27 ENCOUNTER — Telehealth: Payer: Self-pay | Admitting: Internal Medicine

## 2019-03-27 MED ORDER — LEVOTHYROXINE SODIUM 75 MCG PO TABS: ORAL_TABLET | ORAL | 1 refills | Status: DC

## 2019-03-27 MED ORDER — AMLODIPINE BESYLATE 5 MG PO TABS: ORAL_TABLET | ORAL | 1 refills | Status: DC

## 2019-03-27 MED ORDER — HYDROCHLOROTHIAZIDE 25 MG PO TABS: 25.0000 mg | ORAL_TABLET | Freq: Every day | ORAL | 1 refills | Status: DC

## 2019-03-27 MED ORDER — METFORMIN HCL 500 MG PO TABS: ORAL_TABLET | ORAL | 1 refills | Status: DC

## 2019-03-27 NOTE — Telephone Encounter (Signed)
Received Fax RX request from  [facility] [place], [place] - [address] Phone:  [phone]  Fax:  [phone]       Medication -  metFORMIN (GLUCOPHAGE) 500 MG tablet amLODipine (NORVASC) 5 MG tablet hydrochlorothiazide (HYDRODIURIL) 25 MG tablet levothyroxine (SYNTHROID) 75 MCG tablet   Last Refill - 12/27/18  Last OV - 12/22/18  Last CPE - 12/22/18  Next Appointment - 06/23/19

## 2019-04-06 ENCOUNTER — Ambulatory Visit (INDEPENDENT_AMBULATORY_CARE_PROVIDER_SITE_OTHER): Payer: Medicare Other | Admitting: Podiatry

## 2019-04-06 ENCOUNTER — Ambulatory Visit (INDEPENDENT_AMBULATORY_CARE_PROVIDER_SITE_OTHER): Payer: Medicare Other

## 2019-04-06 ENCOUNTER — Encounter: Payer: Self-pay | Admitting: Podiatry

## 2019-04-06 ENCOUNTER — Other Ambulatory Visit: Payer: Self-pay

## 2019-04-06 VITALS — Temp 98.2°F

## 2019-04-06 DIAGNOSIS — G5761 Lesion of plantar nerve, right lower limb: Secondary | ICD-10-CM

## 2019-04-06 DIAGNOSIS — M779 Enthesopathy, unspecified: Secondary | ICD-10-CM

## 2019-04-06 DIAGNOSIS — D3610 Benign neoplasm of peripheral nerves and autonomic nervous system, unspecified: Secondary | ICD-10-CM

## 2019-04-06 DIAGNOSIS — M7741 Metatarsalgia, right foot: Secondary | ICD-10-CM

## 2019-04-12 NOTE — Progress Notes (Signed)
Subjective: 79 year old female presents the office today for concerns of pain to the right foot.  She states that it burns and stings mostly the ball area going to the second toe and third toe mostly.  She states that all the toes feel sore.  Is been to the point where she is having difficulty doing her daily duties.  No radiating pain otherwise.  No weakness or falls.  She is diabetic and last A1c was 6.2 in December 2020. Denies any systemic complaints such as fevers, chills, nausea, vomiting. No acute changes since last appointment, and no other complaints at this time.   Objective: AAO x3, NAD DP/PT pulses palpable bilaterally, CRT less than 3 seconds There is tenderness palpation submetatarsal area in the right foot and most of the discomfort into the second interspace.  No palpable neuromas identified but she is describing burning, tingling to the toes.  There is prominence the metatarsal heads plantarly with atrophy of the fat pad.  There is no area of pinpoint tenderness. No open lesions or pre-ulcerative lesions.  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  -Steroid injection performed the second interspace today.  The skin was prepped with alcohol and a mixture of 1 cc Kenalog 10, 0.5 cc of Marcaine plain, 0.5 cc of lidocaine plain was infiltrated into the second interspace along the area maximal tenderness without complications.  Postinjection care discussed.  Continue offloading pads.  Check orthotic coverage.  Symptoms continued recommended MRI. -Patient encouraged to call the office with any questions, concerns, change in symptoms.   Trula Slade DPM

## 2019-04-20 ENCOUNTER — Other Ambulatory Visit: Payer: Medicare Other | Admitting: Orthotics

## 2019-05-19 ENCOUNTER — Ambulatory Visit: Payer: Medicare Other | Admitting: Podiatry

## 2019-06-02 ENCOUNTER — Encounter: Payer: Self-pay | Admitting: Internal Medicine

## 2019-06-02 LAB — HM DIABETES EYE EXAM

## 2019-06-23 ENCOUNTER — Other Ambulatory Visit: Payer: Medicare Other | Admitting: Internal Medicine

## 2019-06-23 ENCOUNTER — Other Ambulatory Visit: Payer: Self-pay

## 2019-06-23 DIAGNOSIS — Z0000 Encounter for general adult medical examination without abnormal findings: Secondary | ICD-10-CM

## 2019-06-23 DIAGNOSIS — M19041 Primary osteoarthritis, right hand: Secondary | ICD-10-CM

## 2019-06-23 DIAGNOSIS — I5189 Other ill-defined heart diseases: Secondary | ICD-10-CM

## 2019-06-23 DIAGNOSIS — E039 Hypothyroidism, unspecified: Secondary | ICD-10-CM

## 2019-06-23 DIAGNOSIS — E8881 Metabolic syndrome: Secondary | ICD-10-CM

## 2019-06-23 DIAGNOSIS — R7302 Impaired glucose tolerance (oral): Secondary | ICD-10-CM

## 2019-06-23 DIAGNOSIS — N1831 Chronic kidney disease, stage 3a: Secondary | ICD-10-CM

## 2019-06-23 DIAGNOSIS — G4709 Other insomnia: Secondary | ICD-10-CM

## 2019-06-24 LAB — HEMOGLOBIN A1C
Hgb A1c MFr Bld: 6 % of total Hgb — ABNORMAL HIGH (ref <5.7)
Mean Plasma Glucose: 126 (calc)
eAG (mmol/L): 7 (calc)

## 2019-06-24 LAB — BASIC METABOLIC PANEL
BUN: 19 mg/dL (ref 7–25)
CO2: 26 mmol/L (ref 20–32)
Calcium: 9.8 mg/dL (ref 8.6–10.4)
Chloride: 99 mmol/L (ref 98–110)
Creat: 0.93 mg/dL (ref 0.60–0.93)
Glucose, Bld: 112 mg/dL — ABNORMAL HIGH (ref 65–99)
Potassium: 3.9 mmol/L (ref 3.5–5.3)
Sodium: 135 mmol/L (ref 135–146)

## 2019-06-24 LAB — TSH: TSH: 2.03 mIU/L (ref 0.40–4.50)

## 2019-06-25 ENCOUNTER — Ambulatory Visit (INDEPENDENT_AMBULATORY_CARE_PROVIDER_SITE_OTHER): Payer: Medicare Other | Admitting: Internal Medicine

## 2019-06-25 ENCOUNTER — Other Ambulatory Visit: Payer: Self-pay

## 2019-06-25 ENCOUNTER — Encounter: Payer: Self-pay | Admitting: Internal Medicine

## 2019-06-25 VITALS — BP 180/80 | HR 78 | Ht 65.0 in | Wt 173.0 lb

## 2019-06-25 DIAGNOSIS — E039 Hypothyroidism, unspecified: Secondary | ICD-10-CM

## 2019-06-25 DIAGNOSIS — R030 Elevated blood-pressure reading, without diagnosis of hypertension: Secondary | ICD-10-CM

## 2019-06-25 DIAGNOSIS — E8881 Metabolic syndrome: Secondary | ICD-10-CM

## 2019-06-25 DIAGNOSIS — N1831 Chronic kidney disease, stage 3a: Secondary | ICD-10-CM

## 2019-06-25 DIAGNOSIS — M19041 Primary osteoarthritis, right hand: Secondary | ICD-10-CM

## 2019-06-25 DIAGNOSIS — I10 Essential (primary) hypertension: Secondary | ICD-10-CM | POA: Diagnosis not present

## 2019-06-25 DIAGNOSIS — R7302 Impaired glucose tolerance (oral): Secondary | ICD-10-CM

## 2019-06-25 DIAGNOSIS — M19042 Primary osteoarthritis, left hand: Secondary | ICD-10-CM

## 2019-06-25 DIAGNOSIS — F411 Generalized anxiety disorder: Secondary | ICD-10-CM

## 2019-06-25 DIAGNOSIS — K5900 Constipation, unspecified: Secondary | ICD-10-CM

## 2019-06-25 DIAGNOSIS — F418 Other specified anxiety disorders: Secondary | ICD-10-CM

## 2019-06-25 MED ORDER — AMLODIPINE BESYLATE 5 MG PO TABS: ORAL_TABLET | ORAL | 1 refills | Status: DC

## 2019-06-25 MED ORDER — HYDROCHLOROTHIAZIDE 25 MG PO TABS: 25.0000 mg | ORAL_TABLET | Freq: Every day | ORAL | 1 refills | Status: DC

## 2019-06-25 MED ORDER — POTASSIUM CHLORIDE CRYS ER 10 MEQ PO TBCR: 10.0000 meq | EXTENDED_RELEASE_TABLET | Freq: Two times a day (BID) | ORAL | 1 refills | Status: DC

## 2019-06-25 MED ORDER — TRAMADOL HCL 50 MG PO TABS: ORAL_TABLET | ORAL | 0 refills | Status: DC

## 2019-06-25 MED ORDER — METFORMIN HCL 500 MG PO TABS: ORAL_TABLET | ORAL | 1 refills | Status: DC

## 2019-06-25 MED ORDER — DICLOFENAC SODIUM 1 % EX GEL: CUTANEOUS | 3 refills | Status: DC

## 2019-06-25 MED ORDER — LEVOTHYROXINE SODIUM 75 MCG PO TABS: ORAL_TABLET | ORAL | 1 refills | Status: DC

## 2019-06-25 NOTE — Patient Instructions (Addendum)
Continue same meds and RTC in 6 months for CPE.  Monitor blood pressure at home.  Her reading is elevated today.  Your kidney functions are excellent.  Hemoglobin A1c is stable as is TSH.

## 2019-06-25 NOTE — Progress Notes (Signed)
° °  Subjective:    Patient ID: Sharon Peters, female    DOB: [birth year], 79 y.o.   MRN: [id number]  HPI  79 year old Female in  for 6 month recheck. Last seen Dec 2020 for Medicare wellness, health maintenance exam and evaluation of medical issues.  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  She has chronic kidney disease which is being watched and is stable.  Issues with chronic constipation.  Did not like Linzess.  Has tried MiraLAX without much success.  Is on tramadol for hand arthritis which may be contributing to the constipation.  Uses Voltaren gel for hand arthritis and also takes tramadol.  Has seen rheumatologist regarding osteoarthritis of hands in the past.  With regard to hypothyroidism-TSH is normal at 2.03.  With regard to glucose intolerance hemoglobin A1c is excellent at 6%  BUN and creatinine are now normal.  Sodium and potassium are normal.        Review of Systems no new complaints     Objective:   Physical Exam  Blood pressure is elevated today at 180/80, pulse 78, pulse oximetry 98% weight 173 pounds BMI 28.79 neck is supple without JVD thyromegaly or carotid bruits.  Chest clear to auscultation.  Cardiac exam regular rate and rhythm.  Extremities without pitting edema.  Blood pressure was rechecked and was still elevated      Assessment & Plan:  Elevated blood pressure reading-not sure why this is today.  She will follow her blood pressure at home and call me if persistently elevated.  Currently on amlodipine, HCTZ  Glucose intolerance-hemoglobin A1c stable and under good control  History of hypokalemia on diuretic and being managed stable.  Lipid panel not checked today-continue diet and exercise regimen  Hypothyroidism stable on levothyroxine 75 mcg daily  Hand arthritis treated with tramadol  and Voltaren gel  Plan: Continue to work on diet and exercise.  Try to lose some weight.  Walk for exercise if possible.  Continue current medications and follow-up in 6 months.

## 2019-07-23 ENCOUNTER — Telehealth: Payer: Self-pay

## 2019-07-23 NOTE — Telephone Encounter (Signed)
Informed patient of information below.

## 2019-07-23 NOTE — Telephone Encounter (Signed)
BP readings are stable. Dr Doran Durand is the foot specialist at [phone]

## 2019-07-23 NOTE — Telephone Encounter (Signed)
Patient calling to give BP readings. 06/25/2019 - 140/60 06/25/2019 - 125/57 06/27/2019 - 132/60 06/27/2019 - 128/59 07/01/2019 - 137/57 07/02/2019- 129/59 07/03/2019- 132/54 07/05/2019-125/54 07/06/2019 - 128/63 07/07/2019 - 129/58 07/08/2019 - 131/61 6*24/2021 - 130/67 07/11/2019 - 137/58   Also states that as she was walking out of her last appointment, Dr Renold Genta mentioned the name os the orthopedic foot doctor at [facility]. She would like the name of that doctor if possible. CB#: [phone]

## 2019-09-23 ENCOUNTER — Other Ambulatory Visit: Payer: Self-pay | Admitting: Internal Medicine

## 2019-09-28 ENCOUNTER — Other Ambulatory Visit: Payer: Self-pay | Admitting: Internal Medicine

## 2019-09-28 DIAGNOSIS — M19041 Primary osteoarthritis, right hand: Secondary | ICD-10-CM

## 2019-12-22 ENCOUNTER — Other Ambulatory Visit: Payer: Medicare Other | Admitting: Internal Medicine

## 2019-12-22 ENCOUNTER — Other Ambulatory Visit: Payer: Self-pay

## 2019-12-22 DIAGNOSIS — E039 Hypothyroidism, unspecified: Secondary | ICD-10-CM

## 2019-12-22 DIAGNOSIS — I5189 Other ill-defined heart diseases: Secondary | ICD-10-CM

## 2019-12-22 DIAGNOSIS — R7302 Impaired glucose tolerance (oral): Secondary | ICD-10-CM

## 2019-12-22 DIAGNOSIS — E8881 Metabolic syndrome: Secondary | ICD-10-CM

## 2019-12-22 DIAGNOSIS — Z0000 Encounter for general adult medical examination without abnormal findings: Secondary | ICD-10-CM

## 2019-12-22 DIAGNOSIS — N1831 Chronic kidney disease, stage 3a: Secondary | ICD-10-CM

## 2019-12-22 DIAGNOSIS — F418 Other specified anxiety disorders: Secondary | ICD-10-CM

## 2019-12-22 DIAGNOSIS — I10 Essential (primary) hypertension: Secondary | ICD-10-CM

## 2019-12-22 DIAGNOSIS — M19041 Primary osteoarthritis, right hand: Secondary | ICD-10-CM

## 2019-12-23 LAB — COMPLETE METABOLIC PANEL WITH GFR
AG Ratio: 1.8 (calc) (ref 1.0–2.5)
ALT: 16 U/L (ref 6–29)
AST: 21 U/L (ref 10–35)
Albumin: 4.6 g/dL (ref 3.6–5.1)
Alkaline phosphatase (APISO): 70 U/L (ref 37–153)
BUN/Creatinine Ratio: 22 (calc) (ref 6–22)
BUN: 22 mg/dL (ref 7–25)
CO2: 28 mmol/L (ref 20–32)
Calcium: 9.5 mg/dL (ref 8.6–10.4)
Chloride: 100 mmol/L (ref 98–110)
Creat: 1.02 mg/dL — ABNORMAL HIGH (ref 0.60–0.93)
GFR, Est African American: 61 mL/min/{1.73_m2} (ref >=60)
GFR, Est Non African American: 53 mL/min/{1.73_m2} — ABNORMAL LOW (ref >=60)
Globulin: 2.6 g/dL (calc) (ref 1.9–3.7)
Glucose, Bld: 106 mg/dL — ABNORMAL HIGH (ref 65–99)
Potassium: 4.3 mmol/L (ref 3.5–5.3)
Sodium: 136 mmol/L (ref 135–146)
Total Bilirubin: 1 mg/dL (ref 0.2–1.2)
Total Protein: 7.2 g/dL (ref 6.1–8.1)

## 2019-12-23 LAB — LIPID PANEL
Cholesterol: 183 mg/dL (ref <200)
HDL: 60 mg/dL (ref >=50)
LDL Cholesterol (Calc): 100 mg/dL (calc) — ABNORMAL HIGH
Non-HDL Cholesterol (Calc): 123 mg/dL (calc) (ref <130)
Total CHOL/HDL Ratio: 3.1 (calc) (ref <5.0)
Triglycerides: 133 mg/dL (ref <150)

## 2019-12-23 LAB — CBC WITH DIFFERENTIAL/PLATELET
Absolute Monocytes: 643 cells/uL (ref 200–950)
Basophils Absolute: 29 cells/uL (ref 0–200)
Basophils Relative: 0.6 %
Eosinophils Absolute: 72 cells/uL (ref 15–500)
Eosinophils Relative: 1.5 %
HCT: 40.2 % (ref 35.0–45.0)
Hemoglobin: 13.3 g/dL (ref 11.7–15.5)
Lymphs Abs: 1363 cells/uL (ref 850–3900)
MCH: 29 pg (ref 27.0–33.0)
MCHC: 33.1 g/dL (ref 32.0–36.0)
MCV: 87.8 fL (ref 80.0–100.0)
MPV: 11.3 fL (ref 7.5–12.5)
Monocytes Relative: 13.4 %
Neutro Abs: 2693 cells/uL (ref 1500–7800)
Neutrophils Relative %: 56.1 %
Platelets: 244 10*3/uL (ref 140–400)
RBC: 4.58 10*6/uL (ref 3.80–5.10)
RDW: 12.2 % (ref 11.0–15.0)
Total Lymphocyte: 28.4 %
WBC: 4.8 10*3/uL (ref 3.8–10.8)

## 2019-12-23 LAB — TSH: TSH: 2.42 mIU/L (ref 0.40–4.50)

## 2019-12-23 LAB — HEMOGLOBIN A1C
Hgb A1c MFr Bld: 5.9 % of total Hgb — ABNORMAL HIGH (ref <5.7)
Mean Plasma Glucose: 123 mg/dL
eAG (mmol/L): 6.8 mmol/L

## 2019-12-24 ENCOUNTER — Other Ambulatory Visit: Payer: Self-pay

## 2019-12-24 ENCOUNTER — Encounter: Payer: Self-pay | Admitting: Internal Medicine

## 2019-12-24 ENCOUNTER — Ambulatory Visit (INDEPENDENT_AMBULATORY_CARE_PROVIDER_SITE_OTHER): Payer: Medicare Other | Admitting: Internal Medicine

## 2019-12-24 VITALS — BP 160/80 | HR 102 | Ht 65.0 in | Wt 173.0 lb

## 2019-12-24 DIAGNOSIS — N1831 Chronic kidney disease, stage 3a: Secondary | ICD-10-CM

## 2019-12-24 DIAGNOSIS — E039 Hypothyroidism, unspecified: Secondary | ICD-10-CM | POA: Diagnosis not present

## 2019-12-24 DIAGNOSIS — I10 Essential (primary) hypertension: Secondary | ICD-10-CM

## 2019-12-24 DIAGNOSIS — M19041 Primary osteoarthritis, right hand: Secondary | ICD-10-CM

## 2019-12-24 DIAGNOSIS — M19042 Primary osteoarthritis, left hand: Secondary | ICD-10-CM

## 2019-12-24 DIAGNOSIS — F411 Generalized anxiety disorder: Secondary | ICD-10-CM

## 2019-12-24 DIAGNOSIS — R7302 Impaired glucose tolerance (oral): Secondary | ICD-10-CM | POA: Diagnosis not present

## 2019-12-24 DIAGNOSIS — R030 Elevated blood-pressure reading, without diagnosis of hypertension: Secondary | ICD-10-CM

## 2019-12-24 DIAGNOSIS — Z0000 Encounter for general adult medical examination without abnormal findings: Secondary | ICD-10-CM

## 2019-12-24 LAB — POCT URINALYSIS DIPSTICK
Appearance: NEGATIVE
Bilirubin, UA: NEGATIVE
Blood, UA: NEGATIVE
Glucose, UA: POSITIVE — AB
Ketones, UA: NEGATIVE
Leukocytes, UA: NEGATIVE
Nitrite, UA: NEGATIVE
Odor: NEGATIVE
Protein, UA: NEGATIVE
Spec Grav, UA: 1.015 (ref 1.010–1.025)
Urobilinogen, UA: 0.2 E.U./dL
pH, UA: 6.5 (ref 5.0–8.0)

## 2019-12-24 NOTE — Patient Instructions (Addendum)
Patient may try CBD oil for musculoskeletal pain. BP elevated since learning daughter has to have benign liver mass removed  In the future. She has multiple reading of elevated systolic readings. She may call Dr. Bishop Dublin office for advice as she has Stage 3 chronic kidney disease. RTC in 6 months.

## 2019-12-24 NOTE — Progress Notes (Signed)
Subjective:    Patient ID: Sharon Peters, female    DOB: [birth year], 79 y.o.   MRN: [id number]  HPI 79  Year old Female seen  for Medicare wellness, health maintenance exam and evaluation of medical issues.  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 History of chronic kidney disease which is being watched.  History of constipation. Did not like Linzess. Tried MiraLAX without much success. Tramadol may be aggravating constipation. She takes this for hand arthritis.  Is seeing rheumatologist regarding osteoarthritis of hands in the past.  History of chronic kidney disease which is stable. Reading is 1.02. This is not a great deal different than 0.93 which was the reading in June 2021  History of impaired glucose tolerance/diabetes mellitus. Hemoglobin A1c is excellent at 5.9%.  With regard to hyperlipidemia, lipid panel is excellent. LDL is 100.  History of hypothyroidism treated with levothyroxine 75 mcg daily. TSH is 2.42  Past medical history: History of left ventricular diastolic dysfunction, hypertension, hyperlipidemia, metabolic syndrome and hypothyroidism. Left knee arthroscopic surgery around 1997. Tubal ligation in 1979. In 2012 was diagnosed with probable meniscal tear of the left knee injected with steroids. He has been diagnosed in the past with trochanteric bursitis of the left hip.  Social history: Is divorced and resides alone. 2 adult children, a son and a daughter. She is retired from [employer] Does not smoke. Rare alcohol consumption.  Family history: Brother with history of liver cancer with history of hepatitis B infection. Father died at age 31 due to pancreatic cancer. Mother died at age 43 of lung cancer. 3 brothers and 1 sister.   Review of Systems no new complaints. Discussion about various medical  issues she has.     Objective:   Physical Exam Blood pressure 160/80 pulse 102 BMI 28.79 weight 173 pounds  Skin warm and dry. No cervical adenopathy. No thyromegaly. No JVD. No carotid bruits. Chest is clear to auscultation. Breast: No masses appreciated. Abdomen: No hepatosplenomegaly masses or tenderness. No lower extremity pitting edema. Neurological exam: No focal deficits on brief neurological exam.       Assessment & Plan:  Elevated blood pressure reading-patient brings in multiple elevated systolic readings. She may call Dr. Bishop Dublin office for advice on treatment as she has stage III chronic kidney disease. She'll return here in 6 months. Blood pressure seems to be elevated today because daughter may have to have benign liver mass removed and patient is anxious.  Impaired glucose tolerance-stable hemoglobin A1c  Hyperlipidemia essentially normal lipid panel. LDL is 100  Hypothyroidism-stable on thyroid replacement levothyroxine 75 mcg daily  Insomnia treated with Ambien  Chronic hand arthritis treated with Tramadol sparingly. May try CBD oil for musculoskeletal pain.  Chronic kidney disease stage III a followed and stable. Followed at [facility] since 2018. Started on Farxiga by Nephrology.  BMI 28.79-continue with diet and exercise efforts  History of chronic constipation-has tried Linzess but did not like it.  Plan: She will continue with current medications and follow-up here for 35-month recheck in June.  Subjective:   Patient presents for Medicare Annual/Subsequent preventive examination.  Review Past Medical/Family/Social: See above   Risk Factors  Current exercise habits: Light Dietary issues discussed: Low-fat low carbohydrate advised  Cardiac risk factors: Hyperlipidemia, impaired glucose tolerance  Depression Screen  (Note: if answer to either of the following is "Yes", a more complete  depression screening is indicated)   Over the past  two weeks, have you felt down, depressed or hopeless? No  Over the past two weeks, have you felt little interest or pleasure in doing things? No Have you lost interest or pleasure in daily life? No Do you often feel hopeless? No Do you cry easily over simple problems? No   Activities of Daily Living  In your present state of health, do you have any difficulty performing the following activities?:   Driving? No  Managing money? No  Feeding yourself? No  Getting from bed to chair? No  Climbing a flight of stairs? No  Preparing food and eating?: No  Bathing or showering? No  Getting dressed: No  Getting to the toilet? No  Using the toilet:No  Moving around from place to place: No  In the past year have you fallen or had a near fall?:No  Are you sexually active? No  Do you have more than one partner? No   Hearing Difficulties: No  Do you often ask people to speak up or repeat themselves? No  Do you experience ringing or noises in your ears? No  Do you have difficulty understanding soft or whispered voices? No  Do you feel that you have a problem with memory? No Do you often misplace items? No    Home Safety:  Do you have a smoke alarm at your residence? Yes Do you have grab bars in the bathroom? Yes Do you have throw rugs in your house? No   Cognitive Testing  Alert? Yes Normal Appearance?Yes  Oriented to person? Yes Place? Yes  Time? Yes  Recall of three objects? Yes  Can perform simple calculations? Yes  Displays appropriate judgment?Yes  Can read the correct time from a watch face?Yes   List the Names of Other Physician/Practitioners you currently use:  See referral list for the physicians patient is currently seeing.  [facility]  Orthopedist regarding foot issue   Review of Systems: See above   Objective:     General appearance: Appears stated age and mildly obese  Head: Normocephalic, without obvious abnormality, atraumatic  Eyes: conj clear, EOMi  PEERLA  Ears: normal TM's and external ear canals both ears  Nose: Nares normal. Septum midline. Mucosa normal. No drainage or sinus tenderness.  Throat: lips, mucosa, and tongue normal; teeth and gums normal  Neck: no adenopathy, no carotid bruit, no JVD, supple, symmetrical, trachea midline and thyroid not enlarged, symmetric, no tenderness/mass/nodules  No CVA tenderness.  Lungs: clear to auscultation bilaterally  Breasts: normal appearance, no masses or tenderness Heart: regular rate and rhythm, S1, S2 normal, no murmur, click, rub or gallop  Abdomen: soft, non-tender; bowel sounds normal; no masses, no organomegaly  Musculoskeletal: ROM normal in all joints, no crepitus, no deformity, Normal muscle strengthen. Back  is symmetric, no curvature. Skin: Skin color, texture, turgor normal. No rashes or lesions  Lymph nodes: Cervical, supraclavicular, and axillary nodes normal.  Neurologic: CN 2 -12 Normal, Normal symmetric reflexes. Normal coordination and gait  Psych: Alert & Oriented x 3, Mood appear stable.    Assessment:    Annual wellness medicare exam   Plan:    During the course of the visit the patient was educated and counseled about appropriate screening and preventive services including:   Has had 2 COVID-19 immunizations  Needs flu vaccine  Tetanus immunization is up-to-date  Had pneumococcal 23 in 2011 and Prevnar 13 in 2016     Patient  Instructions (the written plan) was given to the patient.  Medicare Attestation  I have personally reviewed:  The patient's medical and social history  Their use of alcohol, tobacco or illicit drugs  Their current medications and supplements  The patient's functional ability including ADLs,fall risks, home safety risks, cognitive, and hearing and visual impairment  Diet and physical activities  Evidence for depression or mood disorders  The patient's weight, height, BMI, and visual acuity have been recorded in the chart. I  have made referrals, counseling, and provided education to the patient based on review of the above and I have provided the patient with a written personalized care plan for preventive services.

## 2019-12-25 LAB — MICROALBUMIN / CREATININE URINE RATIO
Creatinine, Urine: 110 mg/dL (ref 20–275)
Microalb Creat Ratio: 8 mcg/mg creat (ref <30)
Microalb, Ur: 0.9 mg/dL

## 2020-05-02 ENCOUNTER — Other Ambulatory Visit: Payer: Self-pay | Admitting: Internal Medicine

## 2020-05-02 DIAGNOSIS — M19041 Primary osteoarthritis, right hand: Secondary | ICD-10-CM

## 2020-05-12 ENCOUNTER — Telehealth: Payer: Self-pay

## 2020-05-12 NOTE — Telephone Encounter (Signed)
OV

## 2020-05-12 NOTE — Telephone Encounter (Signed)
Patient called she has sinus pressure, swollen glands she wants a zpak called in told her she may need an office visit. She has not been around anybody that is sick. She does not have a home covid test. Temp is 98.6. Symptoms started two days ago.

## 2020-05-12 NOTE — Telephone Encounter (Signed)
Scheduled car visit tomorrow 4/29 at 12:30pm.

## 2020-05-13 ENCOUNTER — Ambulatory Visit (INDEPENDENT_AMBULATORY_CARE_PROVIDER_SITE_OTHER): Payer: Medicare Other | Admitting: Internal Medicine

## 2020-05-13 ENCOUNTER — Other Ambulatory Visit: Payer: Self-pay

## 2020-05-13 ENCOUNTER — Encounter: Payer: Self-pay | Admitting: Internal Medicine

## 2020-05-13 DIAGNOSIS — N1831 Chronic kidney disease, stage 3a: Secondary | ICD-10-CM | POA: Diagnosis not present

## 2020-05-13 DIAGNOSIS — E039 Hypothyroidism, unspecified: Secondary | ICD-10-CM

## 2020-05-13 DIAGNOSIS — I10 Essential (primary) hypertension: Secondary | ICD-10-CM | POA: Diagnosis not present

## 2020-05-13 DIAGNOSIS — J069 Acute upper respiratory infection, unspecified: Secondary | ICD-10-CM

## 2020-05-13 DIAGNOSIS — R7302 Impaired glucose tolerance (oral): Secondary | ICD-10-CM

## 2020-05-13 MED ORDER — AZITHROMYCIN 250 MG PO TABS: ORAL_TABLET | ORAL | 0 refills | Status: AC

## 2020-05-13 NOTE — Telephone Encounter (Signed)
Called patient, she will be here at 12:30

## 2020-05-13 NOTE — Telephone Encounter (Signed)
Call her back now and tell her to forget  home testing as it is too complicated. We will do Car visit.We will test her. She will need to quarantine at home until test results are back ie 2 days likely

## 2020-05-13 NOTE — Telephone Encounter (Signed)
Patient called back to say she has been unable to get a rapid test in time to get results back before appointment today, and she does not want to do test herself because she does not know what she is doing. She will continue to call around and see if she can find someone to do a rapid test, but they put her on hold or say it takes 2-3 hours to get back.

## 2020-05-13 NOTE — Progress Notes (Signed)
   Subjective:    Patient ID: Sharon Peters, female    DOB: 25-Feb-1940, 80 y.o.   MRN: [id number]  HPI 80 year old Female called yesterday with complaint of sinus pressure and swollen glands.  Has had 2 Covid-19 vaccines according to our records.  These were in May 2021.  She says she has been unable to find a rapid Covid test to check for Covid-19 at home.  Feels this is similar respiratory infection which she has had previously in the Spring season.  No fever or shaking chills.  Main complaint is maxillary sinus pressure.  Does not really get discolored nasal drainage when she blows her nose.  No significant discolored sputum production.  Denies fever or shaking chills.  No known COVID-19 exposure.  She has a history of hypertension and chronic kidney disease.  She is followed by [facility].  She has impaired glucose tolerance, hypothyroidism, and hyperlipidemia.  Has osteoarthritis of the hands and has seen rheumatologist in the past.    Review of Systems see above no headache, nausea, vomiting, diarrhea or dysgeusia     Objective:   Physical Exam Temperature 98.3 degrees and pulse oximetry is normal Skin is warm and dry.  TMs are clear.  Pharynx is slightly injected without exudate.  Chest is clear to auscultation without rales or wheezing.  Respiratory virus panel and COVID-19 tests are obtained today nasal swab.  Results are pending.      Assessment & Plan:  Acute upper respiratory infection  Chronic kidney disease  Hypertension  Impaired glucose tolerance  Hyperlipidemia  Plan: Call and for her Zithromax Z-PAK 2 tabs day 1 followed by 1 tab days 2 through 5.  Quarantine until test results are back.  Rest and drink fluids.  Time spent reviewing records, seeing patient, testing for respiratory viruses and COVID-19, E scribing prescription to pharmacy is 20 minutes

## 2020-05-13 NOTE — Patient Instructions (Signed)
COVID-19 and respiratory virus panels are pending.  Quarantine until test results are back.  Take Zithromax Z-PAK 2 tablets day 1 followed by 1 tablet days 2 through 5.  Rest and drink fluids.

## 2020-05-14 LAB — SARS-COV-2 RNA,(COVID-19) QUALITATIVE NAAT: SARS CoV2 RNA: NOT DETECTED

## 2020-05-17 LAB — RESPIRATORY VIRUS PANEL

## 2020-06-07 LAB — HM DIABETES EYE EXAM

## 2020-06-09 ENCOUNTER — Encounter: Payer: Self-pay | Admitting: Internal Medicine

## 2020-06-28 ENCOUNTER — Other Ambulatory Visit: Payer: Medicare Other | Admitting: Internal Medicine

## 2020-06-28 ENCOUNTER — Other Ambulatory Visit: Payer: Self-pay

## 2020-06-28 DIAGNOSIS — I10 Essential (primary) hypertension: Secondary | ICD-10-CM

## 2020-06-28 DIAGNOSIS — R7302 Impaired glucose tolerance (oral): Secondary | ICD-10-CM

## 2020-06-28 DIAGNOSIS — N1831 Chronic kidney disease, stage 3a: Secondary | ICD-10-CM

## 2020-06-28 DIAGNOSIS — E039 Hypothyroidism, unspecified: Secondary | ICD-10-CM

## 2020-06-29 LAB — LIPID PANEL
Cholesterol: 181 mg/dL (ref <200)
HDL: 71 mg/dL (ref >=50)
LDL Cholesterol (Calc): 93 mg/dL (calc)
Non-HDL Cholesterol (Calc): 110 mg/dL (calc) (ref <130)
Total CHOL/HDL Ratio: 2.5 (calc) (ref <5.0)
Triglycerides: 81 mg/dL (ref <150)

## 2020-06-29 LAB — HEMOGLOBIN A1C
Hgb A1c MFr Bld: 6 % of total Hgb — ABNORMAL HIGH (ref <5.7)
Mean Plasma Glucose: 126 mg/dL
eAG (mmol/L): 7 mmol/L

## 2020-06-29 LAB — COMPLETE METABOLIC PANEL WITH GFR
AG Ratio: 1.8 (calc) (ref 1.0–2.5)
ALT: 16 U/L (ref 6–29)
AST: 23 U/L (ref 10–35)
Albumin: 4.6 g/dL (ref 3.6–5.1)
Alkaline phosphatase (APISO): 66 U/L (ref 37–153)
BUN/Creatinine Ratio: 23 (calc) — ABNORMAL HIGH (ref 6–22)
BUN: 24 mg/dL (ref 7–25)
CO2: 27 mmol/L (ref 20–32)
Calcium: 10 mg/dL (ref 8.6–10.4)
Chloride: 102 mmol/L (ref 98–110)
Creat: 1.03 mg/dL — ABNORMAL HIGH (ref 0.60–0.93)
GFR, Est African American: 60 mL/min/{1.73_m2} (ref >=60)
GFR, Est Non African American: 52 mL/min/{1.73_m2} — ABNORMAL LOW (ref >=60)
Globulin: 2.6 g/dL (calc) (ref 1.9–3.7)
Glucose, Bld: 101 mg/dL — ABNORMAL HIGH (ref 65–99)
Potassium: 4.3 mmol/L (ref 3.5–5.3)
Sodium: 138 mmol/L (ref 135–146)
Total Bilirubin: 0.7 mg/dL (ref 0.2–1.2)
Total Protein: 7.2 g/dL (ref 6.1–8.1)

## 2020-06-29 LAB — TSH: TSH: 1.7 mIU/L (ref 0.40–4.50)

## 2020-06-30 ENCOUNTER — Ambulatory Visit (INDEPENDENT_AMBULATORY_CARE_PROVIDER_SITE_OTHER): Payer: Medicare Other | Admitting: Internal Medicine

## 2020-06-30 ENCOUNTER — Other Ambulatory Visit: Payer: Self-pay

## 2020-06-30 VITALS — BP 130/80 | HR 76 | Wt 165.0 lb

## 2020-06-30 DIAGNOSIS — R7302 Impaired glucose tolerance (oral): Secondary | ICD-10-CM | POA: Diagnosis not present

## 2020-06-30 DIAGNOSIS — Z6828 Body mass index (BMI) 28.0-28.9, adult: Secondary | ICD-10-CM | POA: Diagnosis not present

## 2020-06-30 DIAGNOSIS — M19042 Primary osteoarthritis, left hand: Secondary | ICD-10-CM

## 2020-06-30 DIAGNOSIS — E039 Hypothyroidism, unspecified: Secondary | ICD-10-CM

## 2020-06-30 DIAGNOSIS — N182 Chronic kidney disease, stage 2 (mild): Secondary | ICD-10-CM

## 2020-06-30 DIAGNOSIS — M19041 Primary osteoarthritis, right hand: Secondary | ICD-10-CM

## 2020-06-30 DIAGNOSIS — I10 Essential (primary) hypertension: Secondary | ICD-10-CM | POA: Diagnosis not present

## 2020-06-30 MED ORDER — DICLOFENAC SODIUM 1 % EX GEL: 4.0000 g | Freq: Four times a day (QID) | CUTANEOUS | 11 refills | Status: DC

## 2020-06-30 MED ORDER — METFORMIN HCL 500 MG PO TABS: ORAL_TABLET | ORAL | 1 refills | Status: DC

## 2020-06-30 MED ORDER — ZOLPIDEM TARTRATE 5 MG PO TABS: 5.0000 mg | ORAL_TABLET | Freq: Every evening | ORAL | 1 refills | Status: DC | PRN

## 2020-06-30 MED ORDER — LEVOTHYROXINE SODIUM 75 MCG PO TABS: ORAL_TABLET | ORAL | 1 refills | Status: DC

## 2020-06-30 NOTE — Progress Notes (Signed)
   Subjective:    Patient ID: Sharon Peters, female    DOB: 05-02-1940, 80 y.o.   MRN: [id number]  HPI 80 year old Female seen for 60-month follow-up appointment.  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  She is also seen by [facility].  Our records indicate she is only had 2 COVID-19 immunizations.  Booster is strongly recommended.  Shingrix vaccine discussed.  May be obtained at pharmacy.  Recent labs show Hemoglobin A1c to be excellent at 6% on Farxiga and metformin.  Lipid panel is normal.  Fasting glucose is 101.  Creatinine stable at 1.03.  TSH is normal at 1.70.  Feels good and has no new complaints.  Had diabetic eye exam at [facility] May 2022  She has a history of left ventricular diastolic dysfunction, osteoarthritis of her hands, controlled type 2 diabetes mellitus, hypertension, hyperlipidemia, metabolic syndrome and hypothyroidism.  History of constipation.  Tried MiraLAX without much success and tried Linzess but did not like it.  Tramadol aggravates constipation.  She has taken this for hand arthritis.  Has seen rheumatologist for osteoarthritis of her hands in the past.  She is on Farxiga 5 mg daily.  Takes tramadol sparingly for hand arthritis.  Takes Ambien for sleep.  Takes amlodipine for hypertension and metformin for glucose intolerance.  Is on levothyroxine 75 mcg daily for hypothyroidism.  Past medical history: Left knee arthroscopic surgery around 1997.  Tubal ligation in 1979.  In 2012 was diagnosed with probable meniscal tear of the left knee and this was injected with steroids.  Has been diagnosed in the past with trochanteric bursitis of the left hip.  Social history: She is divorced and resides alone.  2 adult children, a son and a daughter.  She is retired from [employer]  Does not smoke.  Rare alcohol  consumption.  Family history: Brother with history of liver cancer with history of hepatitis B infection.  Father died at age 64 due to pancreatic cancer.  Mother died at age 15 of lung cancer.  3 brothers and 1 sister.  Review of Systems  Respiratory: Negative.    Cardiovascular: Negative.   Gastrointestinal: Negative.   Genitourinary: Negative.   Neurological: Negative.   Psychiatric/Behavioral: Negative.    has lost at least 8 pounds since December     Objective:   Physical Exam Blood pressure 130/80 pulse 76 pulse oximetry 97% weight 165 pounds BMI 27.46  Skin: Warm and dry.  Nodes none.  No thyromegaly.  No carotid bruits.  Chest is clear to auscultation without rales or wheezing.  Cardiac exam: Regular rate and rhythm normal S1 and S2.  No lower extremity pitting edema.  Affect thought and judgment are normal.     Assessment & Plan:  Stage IIa chronic kidney disease followed by Dr. Hollie Salk, nephrologist.  Essential hypertension-stable on minimal medication  Impaired glucose tolerance-hemoglobin A1c is stable at 6%  Hyperlipidemia  Hypothyroidism-TSH stable on thyroid replacement.  TSH is 1.70  Insomnia treated with Ambien  Chronic hand osteoarthritis treated with tramadol sparingly  History of chronic constipation-tried Linzess but did not like it  BMI 27.46 improved from December 2021.  Plan: She will continue with current medications.  Glucose impairment is stable as is hyperlipidemia and hypertension.  TSH is stable on thyroid replacement.  No change in medications.  Lipid panel is within normal limits

## 2020-07-02 NOTE — Patient Instructions (Addendum)
It was a pleasure to see you today.  Labs are excellent.  Continue current medications and return in 6 months for Medicare wellness and health maintenance exam along with fasting labs.

## 2020-07-03 ENCOUNTER — Encounter: Payer: Self-pay | Admitting: Internal Medicine

## 2020-07-28 ENCOUNTER — Other Ambulatory Visit: Payer: Self-pay | Admitting: Internal Medicine

## 2020-07-28 DIAGNOSIS — M19041 Primary osteoarthritis, right hand: Secondary | ICD-10-CM

## 2020-08-02 ENCOUNTER — Other Ambulatory Visit: Payer: Self-pay | Admitting: Internal Medicine

## 2020-08-02 DIAGNOSIS — M19042 Primary osteoarthritis, left hand: Secondary | ICD-10-CM

## 2020-08-02 DIAGNOSIS — M19041 Primary osteoarthritis, right hand: Secondary | ICD-10-CM

## 2020-08-02 MED ORDER — ZOLPIDEM TARTRATE 5 MG PO TABS: 5.0000 mg | ORAL_TABLET | Freq: Every evening | ORAL | 1 refills | Status: DC | PRN

## 2020-08-02 NOTE — Telephone Encounter (Signed)
Sharon Peters [phone]  Sharon Peters called to say she needed a new prescription sent to pharmacy before she could get a quote on new medication. ([place])  I went aheah and told her we had researched Good RX and she could go to [facility] and get Zolpidem there for 90 days for $13.28, or [facility] for $21.62 so she would like a prescription sent to [facility] at [facility]. I let her know she could print Good Rx off of her computer or com by here and get it.

## 2020-08-02 NOTE — Telephone Encounter (Signed)
Rx has been sent  

## 2020-08-22 ENCOUNTER — Other Ambulatory Visit: Payer: Self-pay

## 2020-08-22 ENCOUNTER — Encounter (HOSPITAL_BASED_OUTPATIENT_CLINIC_OR_DEPARTMENT_OTHER): Payer: Self-pay | Admitting: *Deleted

## 2020-08-22 ENCOUNTER — Encounter: Payer: Self-pay | Admitting: Internal Medicine

## 2020-08-22 ENCOUNTER — Telehealth: Payer: Self-pay | Admitting: Internal Medicine

## 2020-08-22 ENCOUNTER — Telehealth (INDEPENDENT_AMBULATORY_CARE_PROVIDER_SITE_OTHER): Payer: Medicare Other | Admitting: Internal Medicine

## 2020-08-22 ENCOUNTER — Other Ambulatory Visit (HOSPITAL_BASED_OUTPATIENT_CLINIC_OR_DEPARTMENT_OTHER): Payer: Self-pay

## 2020-08-22 ENCOUNTER — Emergency Department (HOSPITAL_BASED_OUTPATIENT_CLINIC_OR_DEPARTMENT_OTHER)
Admission: EM | Admit: 2020-08-22 | Discharge: 2020-08-22 | Disposition: A | Discharge status: Home / Self Care | Payer: Medicare Other | Attending: Emergency Medicine | Admitting: Emergency Medicine

## 2020-08-22 VITALS — BP 125/62 | HR 92 | Temp 99.5°F

## 2020-08-22 DIAGNOSIS — I129 Hypertensive chronic kidney disease with stage 1 through stage 4 chronic kidney disease, or unspecified chronic kidney disease: Secondary | ICD-10-CM | POA: Diagnosis not present

## 2020-08-22 DIAGNOSIS — U071 COVID-19: Secondary | ICD-10-CM

## 2020-08-22 DIAGNOSIS — Z7984 Long term (current) use of oral hypoglycemic drugs: Secondary | ICD-10-CM | POA: Diagnosis not present

## 2020-08-22 DIAGNOSIS — R0981 Nasal congestion: Secondary | ICD-10-CM | POA: Insufficient documentation

## 2020-08-22 DIAGNOSIS — Z7982 Long term (current) use of aspirin: Secondary | ICD-10-CM | POA: Diagnosis not present

## 2020-08-22 DIAGNOSIS — Z79899 Other long term (current) drug therapy: Secondary | ICD-10-CM | POA: Diagnosis not present

## 2020-08-22 DIAGNOSIS — E1122 Type 2 diabetes mellitus with diabetic chronic kidney disease: Secondary | ICD-10-CM | POA: Insufficient documentation

## 2020-08-22 DIAGNOSIS — E039 Hypothyroidism, unspecified: Secondary | ICD-10-CM | POA: Insufficient documentation

## 2020-08-22 DIAGNOSIS — N182 Chronic kidney disease, stage 2 (mild): Secondary | ICD-10-CM | POA: Diagnosis not present

## 2020-08-22 MED ORDER — NIRMATRELVIR/RITONAVIR (PAXLOVID) TABLET (RENAL DOSING)
2.0000 | ORAL_TABLET | Freq: Two times a day (BID) | ORAL | 0 refills | Status: AC
  Filled 2020-08-22: qty 20, 5d supply, fill #0

## 2020-08-22 MED ORDER — BENZONATATE 100 MG PO CAPS
100.0000 mg | ORAL_CAPSULE | Freq: Three times a day (TID) | ORAL | 0 refills | Status: DC
  Filled 2020-08-22: qty 21, 7d supply, fill #0

## 2020-08-22 NOTE — ED Provider Notes (Signed)
[facility] Provider Note   CSN: [id number] Arrival date & time: 08/22/20  1330     History Chief Complaint  Patient presents with   Covid Positive   Sore Throat   Cough    Sharon Peters is a 80 y.o. female.  The history is provided by the patient.  Cough Cough characteristics:  Non-productive Sputum characteristics:  Nondescript Severity:  Mild Onset quality:  Gradual Duration:  3 days Timing:  Intermittent Progression:  Waxing and waning Chronicity:  New Context: upper respiratory infection (covid+)   Relieved by:  Nothing Associated symptoms: chills, headaches, myalgias, sinus congestion and sore throat   Associated symptoms: no chest pain, no ear pain, no fever, no rash and no shortness of breath       Past Medical History:  Diagnosis Date   Chest discomfort    Nuclear, December, 2008, normal   Diabetes mellitus type II    Ejection fraction    EF 65%, February, 2010, trivial pericardial effusion,   HTN (hypertension)    Hypothyroidism    Obesity    Pericardial effusion    Trivial, echo, February, 2011  /     small...int he past...unexplained...normal sedimentation rate and ANA    Patient Active Problem List   Diagnosis Date Noted   Impaired glucose tolerance 05/26/2015   Chronic kidney disease, stage II (mild) 05/26/2015   Primary osteoarthritis of both hands 05/26/2015   Ejection fraction    Pericardial effusion    Diastolic dysfunction [id number]   Insomnia 08/14/2010   ELECTROCARDIOGRAM, ABNORMAL 07/01/2009   Hypothyroidism 06/30/2009   Essential hypertension 04/27/2008   ARTHRITIS 04/27/2008    Past Surgical History:  Procedure Laterality Date   left arthroscopic knee surgery     TUBAL LIGATION       OB History   No obstetric history on file.     Family History  Problem Relation Age of Onset   Cancer Mother    Cancer Father    Diabetes Other        2 siblings   Cancer Other     Social History    Tobacco Use   Smoking status: Never   Smokeless tobacco: Never  Substance Use Topics   Alcohol use: Yes    Comment: rarely    Home Medications Prior to Admission medications   Medication Sig Start Date End Date Taking? Authorizing Provider  benzonatate (TESSALON) 100 MG capsule Take 1 capsule (100 mg total) by mouth every 8 (eight) hours. 08/22/20  Yes Loucille Takach, DO  nirmatrelvir/ritonavir EUA, renal dosing, (PAXLOVID) TABS Take 2 (two) tablets by mouth 2 (two) times daily for 5 days. 08/22/20 08/27/20 Yes Dwan Fennel, DO  acetaminophen (TYLENOL) 500 MG tablet Take 500 mg by mouth every 6 (six) hours as needed.    [provider]  amLODipine (NORVASC) 10 MG tablet Take 1 tablet by mouth daily. 06/23/20   [provider]  aspirin EC 81 MG tablet Take 81 mg by mouth daily.    [provider]  diclofenac sodium (VOLTAREN) 1 % GEL Apply 2 g topically 3 (three) times daily. Use two tubes per month. 09/23/18   Elby Showers, MD  diclofenac Sodium (VOLTAREN) 1 % GEL Apply 4 g topically 4 (four) times daily. 06/30/20   Elby Showers, MD  FARXIGA 5 MG TABS tablet Take 5 mg by mouth daily. 06/23/20   [provider]  fluticasone (CUTIVATE) 0.05 % cream APPLY TO AFFECTED AREA  BID AS NEEDED FOR RASH ON NECK 08/21/18   [provider]  hydrocortisone 2.5 % cream APPLY TO THE AFFECTED AREA ON FACE TWICE DAILY FOR UP TO 1 WEEK ON AND 1 WEEK OFF AS NEEDED 01/27/19   [provider]  levothyroxine (SYNTHROID) 75 MCG tablet TAKE 1 TABLET BY MOUTH 30 MINUTES BEFORE BREAKFAST ON AN EMPTY STOMACH 06/30/20   Elby Showers, MD  metFORMIN (GLUCOPHAGE) 500 MG tablet TAKE 1 TABLET BY MOUTH EVERY DAY WITH BREAKFAST 06/30/20   Elby Showers, MD  ONE TOUCH ULTRA TEST test strip USE DAILY TO TEST GLUCOSE 10/03/17   [provider]  Polyethylene Glycol 3350 (MIRALAX PO) Take 1 Dose by mouth daily as needed.    [provider]  traMADol (ULTRAM) 50 MG  tablet TAKE 1 TABLET(50 MG) BY MOUTH EVERY 8 HOURS AS NEEDED FOR SEVERE PAIN 07/28/20   Elby Showers, MD  zolpidem (AMBIEN) 5 MG tablet Take 1 tablet (5 mg total) by mouth at bedtime as needed. for sleep 08/02/20   Elby Showers, MD    Allergies    Celecoxib  Review of Systems   Review of Systems  Constitutional:  Positive for chills. Negative for fever.  HENT:  Positive for sore throat. Negative for ear pain.   Eyes:  Negative for pain and visual disturbance.  Respiratory:  Positive for cough. Negative for shortness of breath.   Cardiovascular:  Negative for chest pain and palpitations.  Gastrointestinal:  Negative for abdominal pain and vomiting.  Genitourinary:  Negative for dysuria and hematuria.  Musculoskeletal:  Positive for myalgias. Negative for arthralgias and back pain.  Skin:  Negative for color change and rash.  Neurological:  Positive for headaches. Negative for seizures and syncope.  All other systems reviewed and are negative.  Physical Exam Updated Vital Signs BP (!) 175/86 (BP Location: Right Arm)   Pulse (!) 106   Temp 98.8 F (37.1 C) (Oral)   Resp 20   Ht '5\' 5"'$  (1.651 m)   Wt 76.7 kg   SpO2 100%   BMI 28.12 kg/m   Physical Exam Vitals and nursing note reviewed.  Constitutional:      General: She is not in acute distress.    Appearance: She is well-developed.  HENT:     Head: Normocephalic and atraumatic.     Nose: No congestion.  Eyes:     Conjunctiva/sclera: Conjunctivae normal.  Cardiovascular:     Rate and Rhythm: Normal rate and regular rhythm.     Heart sounds: No murmur heard. Pulmonary:     Effort: Pulmonary effort is normal. No respiratory distress.     Breath sounds: Normal breath sounds.  Abdominal:     Palpations: Abdomen is soft.     Tenderness: There is no abdominal tenderness.  Musculoskeletal:     Cervical back: Neck supple.  Skin:    General: Skin is warm and dry.  Neurological:     Mental Status: She is alert.    ED  Results / Procedures / Treatments   Labs (all labs ordered are listed, but only abnormal results are displayed) Labs Reviewed - No data to display  EKG None  Radiology No results found.  Procedures Procedures   Medications Ordered in ED Medications - No data to display  ED Course  I have reviewed the triage vital signs and the nursing notes.  Pertinent labs & imaging results that were available during my care of the patient were  reviewed by me and considered in my medical decision making (see chart for details).    MDM Rules/Calculators/A&P                           Sharon Peters is here with cough, body aches.  Diagnosed with COVID several days ago.  Had virtual medicine visit and told to come for possible treatment.  She does have some CKD.  We will treat with paxlovid with renal dosing.  Will give [employer]  Overall she appears well.  No respiratory symptoms or respiratory distress.  Normal room air oxygenation as well as ambulatory oxygenation.  Understands return precautions and discharged from the ED in good condition.  Vital signs overall unremarkable.  This chart was dictated using voice recognition software.  Despite best efforts to proofread,  errors can occur which can change the documentation meaning.  Final Clinical Impression(s) / ED Diagnoses Final diagnoses:  [id number]    Rx / DC Orders ED Discharge Orders          Ordered    nirmatrelvir/ritonavir EUA, renal dosing, (PAXLOVID) TABS  2 times daily        08/22/20 1339    benzonatate (TESSALON) 100 MG capsule  Every 8 hours        08/22/20 1339             Buras, DO 08/22/20 1343

## 2020-08-22 NOTE — Telephone Encounter (Signed)
Demetrius Charity [phone]  [place] [phone]  Renee called to say that [place] tested positive for COVID on Friday, she has been running fever, has really severe sore throat, hurts to swallow, hoarse, leg cramps, chills, pressure in her ears. Is home alone.

## 2020-08-22 NOTE — ED Triage Notes (Signed)
Home test for Covid + on Friday, sent here today for evaluation. Ambulates with pulse ox 96% RA.

## 2020-08-22 NOTE — Progress Notes (Addendum)
   Subjective:    Patient ID: Sharon Peters, female    DOB: 11/13/1940, 80 y.o.   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    Due to the Coronavirus pandemic, she is seen via interactive audio and video telecommunications.  She is agreeable to visit in this format today.  She is at her home and I am in my office.She is identified using 2 identifiers as Sharon Peters. Sharon Peters, a patient in this practice.  Patient says she had onset of symptoms on Friday with fever.  Subsequently developed a sore throat and a headache.  Says currently temperature is 99.5 degrees.  Says she has had a headache but the main symptom is sore throat.  She is asking about a visit in the emergency department.  Does not feel like eating.  Has been going to physical therapy for a hip issue.  Says she is careful to wear her mask.  Not sure where she contracted COVID-19.  Our records indicate she had 2 vaccines in May 2021.  Social history: She is divorced and resides alone.  She has 2 adult children, a son and a daughter.  She is retired from [employer].  Does not smoke.  Rare alcohol consumption.  Orthopedic records from Dr. Rip Harbour at [facility] indicates patient has aggravated left knee primary osteoarthritis status post remote left knee arthroscopy.  Diagnosed in May with left greater trochanteric bursitis and mild left hip primary osteoarthritis.    Review of Systems denies nausea or vomiting.  Main complaint is her sore throat and headache.     Objective:   Physical Exam Blood pressure 125/62 pulse 92 temperature 99.5 degrees  Seen virtually she looks fatigued and slightly pale.  Appears to have no acute respiratory distress.  Sounds very hoarse when she speaks.   Not heard to be coughing on video.       Assessment & Plan:  Acute COVID-19 virus infection  Plan: Patient would be a candidate for renal failure dose of Paxlovid.  We discussed this.  She is interested in infusion therapy.  I do not know if she would qualify.  She is also asking about IV fluids.  We have decided the best thing for her to do would be to go to the Emergency Department for evaluation at this point in time.  She resides alone.

## 2020-08-22 NOTE — Patient Instructions (Addendum)
After virtual visit assessing symptoms and discussion regarding various therapies, patient will proceed to [facility] for evaluation.

## 2020-08-22 NOTE — Telephone Encounter (Signed)
Schedule

## 2020-08-23 ENCOUNTER — Telehealth: Payer: Self-pay | Admitting: Internal Medicine

## 2020-08-23 NOTE — Telephone Encounter (Signed)
[place] results to [facility] [phone], phone [phone]  Positive as of 08/19/2020

## 2020-08-24 ENCOUNTER — Telehealth: Payer: Self-pay

## 2020-08-24 NOTE — Telephone Encounter (Signed)
Called to check on patient, she said she is feeling better and she is now able to swallow. She has started PAXLOVID and today she will take her 3rd dose. She said she also has tessalon pearls for her cough.

## 2020-10-27 ENCOUNTER — Encounter: Payer: Self-pay | Admitting: Internal Medicine

## 2020-11-17 ENCOUNTER — Encounter: Payer: Self-pay | Admitting: Internal Medicine

## 2020-11-22 ENCOUNTER — Other Ambulatory Visit: Payer: Self-pay | Admitting: Internal Medicine

## 2020-11-22 DIAGNOSIS — M19041 Primary osteoarthritis, right hand: Secondary | ICD-10-CM

## 2020-12-27 ENCOUNTER — Other Ambulatory Visit: Payer: Self-pay

## 2020-12-27 ENCOUNTER — Other Ambulatory Visit: Payer: Medicare Other | Admitting: Internal Medicine

## 2020-12-27 DIAGNOSIS — Z13220 Encounter for screening for lipoid disorders: Secondary | ICD-10-CM

## 2020-12-27 DIAGNOSIS — E039 Hypothyroidism, unspecified: Secondary | ICD-10-CM

## 2020-12-27 DIAGNOSIS — R7302 Impaired glucose tolerance (oral): Secondary | ICD-10-CM

## 2020-12-27 DIAGNOSIS — I10 Essential (primary) hypertension: Secondary | ICD-10-CM

## 2020-12-28 LAB — COMPLETE METABOLIC PANEL WITH GFR
AG Ratio: 1.6 (calc) (ref 1.0–2.5)
ALT: 13 U/L (ref 6–29)
AST: 20 U/L (ref 10–35)
Albumin: 4.6 g/dL (ref 3.6–5.1)
Alkaline phosphatase (APISO): 66 U/L (ref 37–153)
BUN/Creatinine Ratio: 15 (calc) (ref 6–22)
BUN: 16 mg/dL (ref 7–25)
CO2: 28 mmol/L (ref 20–32)
Calcium: 10.2 mg/dL (ref 8.6–10.4)
Chloride: 102 mmol/L (ref 98–110)
Creat: 1.04 mg/dL — ABNORMAL HIGH (ref 0.60–1.00)
Globulin: 2.9 g/dL (calc) (ref 1.9–3.7)
Glucose, Bld: 108 mg/dL — ABNORMAL HIGH (ref 65–99)
Potassium: 4.5 mmol/L (ref 3.5–5.3)
Sodium: 139 mmol/L (ref 135–146)
Total Bilirubin: 0.8 mg/dL (ref 0.2–1.2)
Total Protein: 7.5 g/dL (ref 6.1–8.1)
eGFR: 55 mL/min/{1.73_m2} — ABNORMAL LOW (ref >=60)

## 2020-12-28 LAB — CBC WITH DIFFERENTIAL/PLATELET
Absolute Monocytes: 544 cells/uL (ref 200–950)
Basophils Absolute: 29 cells/uL (ref 0–200)
Basophils Relative: 0.6 %
Eosinophils Absolute: 78 cells/uL (ref 15–500)
Eosinophils Relative: 1.6 %
HCT: 42.5 % (ref 35.0–45.0)
Hemoglobin: 14.4 g/dL (ref 11.7–15.5)
Lymphs Abs: 1485 cells/uL (ref 850–3900)
MCH: 30.1 pg (ref 27.0–33.0)
MCHC: 33.9 g/dL (ref 32.0–36.0)
MCV: 88.9 fL (ref 80.0–100.0)
MPV: 11.3 fL (ref 7.5–12.5)
Monocytes Relative: 11.1 %
Neutro Abs: 2764 cells/uL (ref 1500–7800)
Neutrophils Relative %: 56.4 %
Platelets: 235 10*3/uL (ref 140–400)
RBC: 4.78 10*6/uL (ref 3.80–5.10)
RDW: 12.3 % (ref 11.0–15.0)
Total Lymphocyte: 30.3 %
WBC: 4.9 10*3/uL (ref 3.8–10.8)

## 2020-12-28 LAB — HEMOGLOBIN A1C
Hgb A1c MFr Bld: 6.2 % of total Hgb — ABNORMAL HIGH (ref <5.7)
Mean Plasma Glucose: 131 mg/dL
eAG (mmol/L): 7.3 mmol/L

## 2020-12-28 LAB — LIPID PANEL
Cholesterol: 191 mg/dL (ref <200)
HDL: 70 mg/dL (ref >=50)
LDL Cholesterol (Calc): 99 mg/dL (calc)
Non-HDL Cholesterol (Calc): 121 mg/dL (calc) (ref <130)
Total CHOL/HDL Ratio: 2.7 (calc) (ref <5.0)
Triglycerides: 122 mg/dL (ref <150)

## 2020-12-28 LAB — TSH: TSH: 2.88 mIU/L (ref 0.40–4.50)

## 2020-12-29 ENCOUNTER — Other Ambulatory Visit: Payer: Self-pay

## 2020-12-29 ENCOUNTER — Ambulatory Visit (INDEPENDENT_AMBULATORY_CARE_PROVIDER_SITE_OTHER): Payer: Medicare Other | Admitting: Internal Medicine

## 2020-12-29 ENCOUNTER — Encounter: Payer: Self-pay | Admitting: Internal Medicine

## 2020-12-29 VITALS — BP 140/64 | HR 79 | Temp 98.5°F | Ht 64.75 in | Wt 171.0 lb

## 2020-12-29 DIAGNOSIS — Z0000 Encounter for general adult medical examination without abnormal findings: Secondary | ICD-10-CM | POA: Diagnosis not present

## 2020-12-29 DIAGNOSIS — E039 Hypothyroidism, unspecified: Secondary | ICD-10-CM | POA: Diagnosis not present

## 2020-12-29 DIAGNOSIS — N182 Chronic kidney disease, stage 2 (mild): Secondary | ICD-10-CM

## 2020-12-29 DIAGNOSIS — R7302 Impaired glucose tolerance (oral): Secondary | ICD-10-CM

## 2020-12-29 DIAGNOSIS — I10 Essential (primary) hypertension: Secondary | ICD-10-CM

## 2020-12-29 DIAGNOSIS — M7918 Myalgia, other site: Secondary | ICD-10-CM

## 2020-12-29 LAB — POCT URINALYSIS DIPSTICK
Blood, UA: NEGATIVE
Glucose, UA: POSITIVE — AB
Leukocytes, UA: NEGATIVE
Nitrite, UA: NEGATIVE
Protein, UA: NEGATIVE
Spec Grav, UA: 1.015 (ref 1.010–1.025)
Urobilinogen, UA: 0.2 E.U./dL
pH, UA: 6 (ref 5.0–8.0)

## 2020-12-29 NOTE — Patient Instructions (Addendum)
RTC in 6 months for OV, Hgb AIC, B-met, Lipid panel. Continue current meds.

## 2020-12-29 NOTE — Progress Notes (Signed)
Annual Wellness Visit     Patient: Sharon Peters, Female    DOB: 21-Apr-1940, 80 y.o.   MRN: [id number] Visit Date: 12/29/2020  Chief Complaint  Patient presents with   Medicare Wellness   Subjective    Sharon Peters is a 80 y.o. female who presents today for her Annual Wellness Visit.  HPI She also presents for health maintenance exam and evaluation of multiple medical issues.  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  History of chronic kidney disease which is being observed.  History of constipation.  Did not like Linzess.  Tried MiraLAX without much success.  Takes tramadol for pain which may aggravate constipation.  She takes this for hand arthritis.  She has seen rheumatologist regarding osteoarthritis of her hands in the past.  History of chronic kidney disease which is stable.  Followed at [facility] and was seen there in October 2022.  Has been followed there since 2018.  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  Is on Farxiga 5 mg daily, is on amlodipine 10 mg daily which may be causing some lower extremity edema.  She could take some Lasix for this.  This was suggested by nephrologist if needed.  She has hypothyroidism treated with 75 mcg levothyroxine daily.  She is also on metformin 500 mg daily.  Continues on tramadol for pain.  Takes Ambien 5 mg at night as needed for sleep.  Had colonoscopy October 13 at [facility].  No repeat is necessary per her gastroenterologist as she had no adenomatous polyps.  History of left ventricular diastolic dysfunction, hypertension, hyperlipidemia, hypothyroidism and metabolic syndrome.  Left knee arthroscopic surgery around 1997.  Tubal ligation in 1979.  In 2012 was diagnosed with probable meniscal tear of the left knee injected with  steroids.  She has been diagnosed in the past with trochanteric bursitis of the left hip.  Hemoglobin A1c has increased from 1 year ago at 5.9% to 6.2%.  Lipid panel is normal. Fasting glucose is 108 and creatinine is 1.04.  Social History   Social History Narrative   Not on file  She is divorced and resides alone.  2 adult children, a son and a daughter.  She is retired from [employer].  Does not smoke.  Rare alcohol consumption.  Family history: Brother with history of liver cancer and history of hepatitis B infection.  Father died at age 45 due to pancreatic cancer.  Mother died at age 28 of lung cancer.  3 brothers and 1 sister.  Patient Care Team: Elby Showers, MD as PCP - General (Internal Medicine)  Review of Systems   Objective    Vitals:  Blood pressure 140/64 pulse 79 temperature 98.5 degrees pulse oximetry 98% weight 171 pounds BMI 28.68  Physical Exam Skin is warm and dry.  No cervical adenopathy.  No thyromegaly.  No carotid bruits.  Chest is clear to auscultation.  Cardiac exam: Regular rate and rhythm without ectopy.  Abdomen is soft nondistended without hepatosplenomegaly masses or tenderness.  No lower extremity pitting edema.  Neurological exam is intact without gross focal deficits.  Most recent functional status assessment: In your present state of health, do you have any difficulty performing the following activities: 12/29/2020  Hearing? N  Vision? N  Difficulty concentrating or making decisions? N  Walking or climbing stairs? N  Dressing or bathing? N  Doing errands, shopping? N  Preparing Food and eating ? N  Using the Toilet? N  In the past six months, have you accidently leaked urine? N  Do you have problems with loss of bowel control? N  Managing your Medications? N  Managing your Finances? N  Housekeeping or managing your Housekeeping? N  Some recent data might be hidden   Most recent fall risk assessment: Fall Risk  12/29/2020  Falls in  the past year? 0  Number falls in past yr: 0  Injury with Fall? 0  Risk for fall due to : No Fall Risks  Follow up Falls evaluation completed    Most recent depression screenings: PHQ 2/9 Scores 12/29/2020 12/29/2020  PHQ - 2 Score 0 0  PHQ- 9 Score 0 -   Most recent cognitive screening: 6CIT Screen 12/29/2020  What Year? 0 points  What month? 0 points  What time? 0 points  Count back from 20 0 points  Months in reverse 0 points  Repeat phrase 0 points  Total Score 0       Assessment & Plan     Annual wellness visit done today including the all of the following: Reviewed patient's Family Medical History Reviewed and updated list of patient's medical providers Assessment of cognitive impairment was done Assessed patient's functional ability Established a written schedule for health screening [place] Completed and Reviewed  Discussed health benefits of physical activity, and encouraged her to engage in regular exercise appropriate for her age and condition.     Hyperlipidemia with essentially stable lipid panel  Hypertension treated with amlodipine 10 mg daily-may be causing mild edema  Chronic kidney disease stage II followed by nephrologist.  Creatinine is 1.04.  Will be rechecked in 6 months.  Estimated GFR is 55 cc/min and normal is greater than 60 cc/min.  Impaired glucose tolerance-hemoglobin A1c 6.2%.  Patient is on Farxiga  Hypothyroidism-TSH stable at 2.88 on levothyroxine 75 mcg daily  Musculoskeletal pain treated with tramadol  BMI 28.68-continue to work on diet and exercise  Insomnia treated with Ambien  History of constipation-colonoscopy was within normal limits and does not need to be repeated  Plan: Patient will return in 6 months for hemoglobin W4X, basic metabolic panel, lipid panel and office visit.     {I, Elby Showers, MD, have reviewed all documentation for this visit. The documentation on 02/11/21 for the exam,  diagnosis, procedures, and orders are all accurate and complete.   Angus Seller, CMA

## 2020-12-30 LAB — MICROALBUMIN / CREATININE URINE RATIO
Creatinine, Urine: 76 mg/dL (ref 20–275)
Microalb Creat Ratio: 9 mcg/mg creat (ref <30)
Microalb, Ur: 0.7 mg/dL

## 2021-03-06 ENCOUNTER — Ambulatory Visit (INDEPENDENT_AMBULATORY_CARE_PROVIDER_SITE_OTHER): Payer: Medicare Other | Admitting: Podiatry

## 2021-03-06 ENCOUNTER — Other Ambulatory Visit: Payer: Self-pay

## 2021-03-06 DIAGNOSIS — G5761 Lesion of plantar nerve, right lower limb: Secondary | ICD-10-CM | POA: Diagnosis not present

## 2021-03-06 DIAGNOSIS — M7741 Metatarsalgia, right foot: Secondary | ICD-10-CM | POA: Diagnosis not present

## 2021-03-06 NOTE — Patient Instructions (Signed)
I have ordered physical therapy for [facility]. If you do not hear for them about scheduling within the next 1 week, or you have any questions please give [place] a call at [phone].

## 2021-03-11 NOTE — Progress Notes (Signed)
Subjective: 81 year old female presents the office today for follow-up evaluation of right foot pain she is asking about physical therapy.  She continues to get symptoms on the foot and she describes burning stinging into the ball of the foot and into the digits.  Previously we had discussed neuroma, metatarsalgia.  Objective: AAO x3, NAD DP/PT pulses palpable bilaterally, CRT less than 3 seconds There is prominence of metatarsal heads plantarly with atrophy of the fat pad.  There is discomfort on the second and third interspace.  I am able to palpate a small neuroma versus bursa on the second interspace.  There is no area pinpoint tenderness.  No pain with MPJ range of motion. No pain with calf compression, swelling, warmth, erythema  Assessment: Neuroma, metatarsalgia  Plan: -All treatment options discussed with the patient including all alternatives, risks, complications.  -Continue with supportive shoe gear, offloading.  I would refer her to physical therapy, [facility]. -Patient encouraged to call the office with any questions, concerns, change in symptoms.   Trula Slade DPM

## 2021-03-22 ENCOUNTER — Telehealth: Payer: Self-pay | Admitting: Internal Medicine

## 2021-03-22 NOTE — Telephone Encounter (Signed)
LVM and MYchart that appointment changed from 9:45 to 9:30. Please CB to confirm ?

## 2021-03-23 ENCOUNTER — Other Ambulatory Visit: Payer: Self-pay | Admitting: Internal Medicine

## 2021-04-03 ENCOUNTER — Telehealth: Payer: Self-pay | Admitting: Internal Medicine

## 2021-04-03 NOTE — Telephone Encounter (Signed)
Primitivo Gauze ?[phone] ? ?Sharon Peters called to say she was going to have surgery in May and she needs clearance. I ask her to have surgeons office to fax over what they need and we will schedule a office appointment. ?

## 2021-04-04 NOTE — Telephone Encounter (Signed)
Patient called back to confirm appointment change ?

## 2021-04-06 NOTE — Telephone Encounter (Signed)
Patient called back and said ortho would fax Surgery Clearance in April. ?

## 2021-05-01 NOTE — Telephone Encounter (Signed)
LVM to CB to schedule surgery clearance appointment ?

## 2021-05-02 NOTE — Telephone Encounter (Signed)
Scheduled

## 2021-05-08 DIAGNOSIS — Z0289 Encounter for other administrative examinations: Secondary | ICD-10-CM

## 2021-05-09 ENCOUNTER — Encounter: Payer: Self-pay | Admitting: Internal Medicine

## 2021-05-09 ENCOUNTER — Ambulatory Visit (INDEPENDENT_AMBULATORY_CARE_PROVIDER_SITE_OTHER): Payer: Medicare Other | Admitting: Internal Medicine

## 2021-05-09 ENCOUNTER — Telehealth: Payer: Self-pay | Admitting: Internal Medicine

## 2021-05-09 VITALS — BP 140/84 | HR 88 | Temp 98.7°F | Ht 65.0 in | Wt 166.5 lb

## 2021-05-09 DIAGNOSIS — Z01811 Encounter for preprocedural respiratory examination: Secondary | ICD-10-CM

## 2021-05-09 DIAGNOSIS — I10 Essential (primary) hypertension: Secondary | ICD-10-CM | POA: Diagnosis not present

## 2021-05-09 DIAGNOSIS — R7302 Impaired glucose tolerance (oral): Secondary | ICD-10-CM

## 2021-05-09 DIAGNOSIS — Z01818 Encounter for other preprocedural examination: Secondary | ICD-10-CM

## 2021-05-09 DIAGNOSIS — G4700 Insomnia, unspecified: Secondary | ICD-10-CM

## 2021-05-09 DIAGNOSIS — E039 Hypothyroidism, unspecified: Secondary | ICD-10-CM

## 2021-05-09 DIAGNOSIS — M19042 Primary osteoarthritis, left hand: Secondary | ICD-10-CM

## 2021-05-09 DIAGNOSIS — N182 Chronic kidney disease, stage 2 (mild): Secondary | ICD-10-CM

## 2021-05-09 DIAGNOSIS — M19041 Primary osteoarthritis, right hand: Secondary | ICD-10-CM

## 2021-05-09 DIAGNOSIS — I4510 Unspecified right bundle-branch block: Secondary | ICD-10-CM

## 2021-05-09 NOTE — Progress Notes (Deleted)
Ekg

## 2021-05-09 NOTE — Progress Notes (Addendum)
Subjective:    Patient ID: Sharon Peters, female    DOB: 07/04/1940, 81 y.o.   MRN: [id number]  HPI 81 year old Female seen for left knee arthroplasty by Dr. Latanya Maudlin planned for May 11.  Patient was last seen here December 2022.  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  History of chronic kidney disease which is being observed by Nephrology.  Last seen in October 2022 by [facility].  Diagnosed with chronic kidney disease stage II.  Has been followed there since 2018.  When last seen there she was started on HCTZ for mild dependent edema and has improved.  Edema may be related to amlodipine more than mild chronic kidney disease.  Had left knee arthroscopic surgery around 1997.  Tubal ligation in 1979.  In 2012 was diagnosed with probable meniscal tear of the left knee injected with steroids.  Has been diagnosed in the past with trochanteric bursitis of the left hip.  Social history: She is divorced and resides alone.  2 adult children, a son and a daughter.  She is retired from [employer].  Does not smoke.  Rare alcohol consumption.  Family history: Brother with history of liver cancer and history of hepatitis B infection.  Father died at age 7 due to pancreatic cancer.  Mother died at age 58 of lung cancer.  3 brothers and 1 sister.  Preoperative EKG shows right bundle branch block with possible right atrial enlargement and possible RVH.  Had 2D echocardiogram in 2014 which was normal.  Her EKG in 2014 showed incomplete right bundle branch block and RVH.  No EKG on file in Epic since 2014 until today.  Preoperative labs obtained and results are pending today.  These include hemoglobin A1c, CBC with differential and c-Met.  Lipid panel in December 2022 was normal, creatinine was 1.04 estimated GFR 55  cc/min.    Review of Systems No complaint of chest pain or shortness of breath.  Main issue is left knee discomfort which is interfering with her activities    Objective:   Physical Exam Her blood pressure was elevated on arrival at 160/68.  Blood pressure repeated and was 140/84  No Carotid bruits. Chest: clear; Cor: RRR, No LE pitting edema     Assessment & Plan:  Osteoarthritis left knee scheduled for left TKA  May 11  Right bundle branch block on EKG.  Referral to Cardiologist.  It was present in 2014.  However I would like for her to see cardiologist prior to her surgery.  Stage II chronic kidney disease followed by [facility] in stable  Essential hypertension-blood pressure elevated on arrival today but I think patient was anxious.  Blood pressure improved with recheck.  She says it runs basically normal at home.  History of office hypertension also noted at Nephrologist.  Was having some dependent edema likely due to amlodipine 10 mg daily and was started on HCTZ when seen at [facility] October 2022.  History of left ventricular diastolic dysfunction  Hypothyroidism stable on thyroid replacement  Type 2 diabetes mellitus treated with metformin 500 mg daily and Farxiga 5 mg daily  Chronic musculoskeletal pain treated with tramadol 50 mg every 8 hours as needed but patient takes it sparingly.   Longstanding history of insomnia treated with Ambien without side effects  History of COVID-19 in August 2022.  Records indicate only 2 COVID vaccines in 2021.  Declines further vaccinations for COVID-19.    Tetanus immunization given in 2015.  Has annual flu vaccine.  Plan: Cardiology referral requested for Cardiac clearance given multiple medical issues given right bundle branch block and possible atrial enlargement on EKG.

## 2021-05-09 NOTE — Progress Notes (Deleted)
? ?  Subjective:  ? ? Patient ID: Sharon Peters, female    DOB: 06-13-1940, 81 y.o.   MRN: [id number] ? ?HPI 81 year old Female seen for pre-operative clearance for  left TKA by Dr. Rhona Raider. Surgery scheduled for May 25, 2021 at [facility]. ? ? ? ? ? ?Review of Systems was placed on HCTZ by Nephrologist in March with improvement in mild LE edema. ? ?   ?Objective:  ? Physical Exam ? ? ? ? ?   ?Assessment & Plan:  ? ? ?

## 2021-05-09 NOTE — Patient Instructions (Addendum)
You are being referred to cardiology for evaluation prior to your orthopedic surgery due to right bundle branch block on EKG which is not new.  Preoperative labs drawn and pending. ?

## 2021-05-09 NOTE — Telephone Encounter (Signed)
LVM that we had scheduled an appointment with cardiologist for Monday for Surgery Clearance ?

## 2021-05-10 LAB — CBC WITH DIFFERENTIAL/PLATELET
Absolute Monocytes: 723 cells/uL (ref 200–950)
Basophils Absolute: 19 cells/uL (ref 0–200)
Basophils Relative: 0.3 %
Eosinophils Absolute: 38 cells/uL (ref 15–500)
Eosinophils Relative: 0.6 %
HCT: 43.3 % (ref 35.0–45.0)
Hemoglobin: 14.4 g/dL (ref 11.7–15.5)
Lymphs Abs: 1478 cells/uL (ref 850–3900)
MCH: 29.2 pg (ref 27.0–33.0)
MCHC: 33.3 g/dL (ref 32.0–36.0)
MCV: 87.8 fL (ref 80.0–100.0)
MPV: 11.4 fL (ref 7.5–12.5)
Monocytes Relative: 11.3 %
Neutro Abs: 4141 cells/uL (ref 1500–7800)
Neutrophils Relative %: 64.7 %
Platelets: 253 10*3/uL (ref 140–400)
RBC: 4.93 10*6/uL (ref 3.80–5.10)
RDW: 12.5 % (ref 11.0–15.0)
Total Lymphocyte: 23.1 %
WBC: 6.4 10*3/uL (ref 3.8–10.8)

## 2021-05-10 LAB — HEMOGLOBIN A1C
Hgb A1c MFr Bld: 6 % of total Hgb — ABNORMAL HIGH (ref <5.7)
Mean Plasma Glucose: 126 mg/dL
eAG (mmol/L): 7 mmol/L

## 2021-05-10 LAB — BASIC METABOLIC PANEL
BUN: 15 mg/dL (ref 7–25)
CO2: 22 mmol/L (ref 20–32)
Calcium: 9.9 mg/dL (ref 8.6–10.4)
Chloride: 98 mmol/L (ref 98–110)
Creat: 0.92 mg/dL (ref 0.60–1.00)
Glucose, Bld: 100 mg/dL — ABNORMAL HIGH (ref 65–99)
Potassium: 3.7 mmol/L (ref 3.5–5.3)
Sodium: 135 mmol/L (ref 135–146)

## 2021-05-10 LAB — CP4508-PT/INR AND PTT
INR: 1
Prothrombin Time: 10 s (ref 9.0–11.5)
aPTT: 29 s (ref 23–32)

## 2021-05-10 NOTE — Telephone Encounter (Signed)
Surgery Clearance faxed ?This message was sent via [employer], a product from [employer] http://www.biscom.com/ ? ?                  -------Fax Transmission Report------- ? ?To:               Recipient at [phone] ?Subject:          FW: Hp Scans ?Result:           The transmission was successful. ?Explanation:      All Pages Ok ?Pages Sent:       16 ?Connect Time:     9 minutes, 36 seconds ?Transmit Time:    05/10/2021 12:30 ?Transfer Rate:    14400 ?Status Code:      0000 ?Retry Count:      0 ?Job Id:           [id number] ?Unique Id:        [id number] ?Fax Line:         19 ?Fax Server:       [id number] ? ? ?

## 2021-05-10 NOTE — Telephone Encounter (Signed)
Surgery Clearance faxed to ?This message was sent via [place], a product from [employer] http://www.biscom.com/ ? ?                  -------Fax Transmission Report------- ? ?To:               Recipient at [phone] ?Subject:          FW: Hp Scans ?Result:           The transmission was successful. ?Explanation:      All Pages Ok ?Pages Sent:       16 ?Connect Time:     7 minutes, 59 seconds ?Transmit Time:    05/10/2021 12:40 ?Transfer Rate:    14400 ?Status Code:      0000 ?Retry Count:      1 ?Job Id:           [id number] ?Unique Id:        [id number] ?Fax Line:         52 ?Fax Server:       [id number] ? ? ?

## 2021-05-10 NOTE — Telephone Encounter (Signed)
Results have been relayed to the patient. The patient verbalized understanding. No questions at this time.   

## 2021-05-15 ENCOUNTER — Ambulatory Visit (INDEPENDENT_AMBULATORY_CARE_PROVIDER_SITE_OTHER): Payer: Medicare Other | Admitting: Internal Medicine

## 2021-05-15 ENCOUNTER — Encounter: Payer: Self-pay | Admitting: Internal Medicine

## 2021-05-15 VITALS — BP 140/61 | HR 79 | Ht 65.0 in | Wt 163.2 lb

## 2021-05-15 DIAGNOSIS — R9431 Abnormal electrocardiogram [ECG] [EKG]: Secondary | ICD-10-CM | POA: Diagnosis not present

## 2021-05-15 NOTE — Progress Notes (Signed)
?Cardiology Office Note:   ? ?Date:  05/15/2021  ? ?ID:  Sharon Peters, DOB [birth year], MRN [id number] ? ?PCP:  Elby Showers, MD ?  ?[place] [facility] Providers ?Cardiologist:  None    ? ?Referring MD: Elby Showers, MD  ? ?No chief complaint on file. ?Pre-op ? ?History of Present Illness:   ? ?Sharon Peters is a 81 y.o. female with a hx of HTN, hypothyroid, DMII, referral for pre-op for L TKA ? ?EKG 05/10/2011: NSR, LAE, RBBB ? ?She denies CP or SOB with > 4 METs. She climbs up slowly. She does walking well up to a mile.  Minor palpitations. No syncope.She has hx of vertigo. ? ?Blood pressures good control typically. 120s-130 at home. ? ?Former patient of Dr. Ron Parker. Saw for abnormalities on her EKG. RVH, iRBBB.  Her echo was normal; with normal RV size and function. He noted with her normal echo in 2014 that she did not need further cardiology FU at that time.  ? ?12/27/2020 ?TC 191 mg/dL ?LDL 99 mg/dL ? ?TSH normal.  ? ?Past Medical History:  ?Diagnosis Date  ? Chest discomfort   ? Nuclear, December, 2008, normal  ? Diabetes mellitus type II   ? Ejection fraction   ? EF 65%, February, 2010, trivial pericardial effusion,  ? HTN (hypertension)   ? Hypothyroidism   ? Obesity   ? Pericardial effusion   ? Trivial, echo, February, 2011  /     small...int he past...unexplained...normal sedimentation rate and ANA  ? ? ?Past Surgical History:  ?Procedure Laterality Date  ? left arthroscopic knee surgery    ? TUBAL LIGATION    ? ? ?Current Medications: ?Current Meds  ?Medication Sig  ? acetaminophen (TYLENOL) 500 MG tablet Take 500 mg by mouth every 6 (six) hours as needed.  ? amLODipine (NORVASC) 10 MG tablet Take 1 tablet by mouth daily.  ? diclofenac sodium (VOLTAREN) 1 % GEL Apply 2 g topically 3 (three) times daily. Use two tubes per month.  ? FARXIGA 5 MG TABS tablet Take 5 mg by mouth daily.  ? hydrocortisone 2.5 % cream APPLY TO THE AFFECTED AREA ON FACE TWICE DAILY FOR UP TO 1 WEEK ON AND 1 WEEK OFF AS NEEDED  ?  levothyroxine (SYNTHROID) 75 MCG tablet TAKE 1 TABLET BY MOUTH 30 MINUTES BEFORE BREAKFAST ON AN EMPTY STOMACH  ? metFORMIN (GLUCOPHAGE) 500 MG tablet TAKE 1 TABLET BY MOUTH EVERY DAY WITH BREAKFAST  ? ONE TOUCH ULTRA TEST test strip USE DAILY TO TEST GLUCOSE  ? senna (SENOKOT) 8.6 MG tablet Take 1 tablet by mouth as needed for constipation.  ? traMADol (ULTRAM) 50 MG tablet TAKE 1 TABLET(50 MG) BY MOUTH EVERY 8 HOURS AS NEEDED FOR SEVERE PAIN  ? zolpidem (AMBIEN) 5 MG tablet Take 1 tablet (5 mg total) by mouth at bedtime as needed. for sleep  ?  ? ?Allergies:   Celecoxib  ? ?Social History  ? ?Socioeconomic History  ? Marital status: Divorced  ?  Spouse name: Not on file  ? Number of children: Not on file  ? Years of education: Not on file  ? Highest education level: Not on file  ?Occupational History  ? Occupation: retired  ?Tobacco Use  ? Smoking status: Never  ? Smokeless tobacco: Never  ?Substance and Sexual Activity  ? Alcohol use: Yes  ?  Comment: rarely  ? Drug use: Not on file  ? Sexual activity: Not on file  ?Other Topics  Concern  ? Not on file  ?Social History Narrative  ? Not on file  ? ?Social Determinants of Health  ? ?Financial Resource Strain: Not on file  ?Food Insecurity: Not on file  ?Transportation Needs: Not on file  ?Physical Activity: Not on file  ?Stress: Not on file  ?Social Connections: Not on file  ?  ? ?Family History: ?The patient's family history includes Cancer in her father, mother, and another family member; Diabetes in an other family member. ? ?ROS:   ?Please see the history of present illness.    ? All other systems reviewed and are negative. ? ?EKGs/Labs/Other Studies Reviewed:   ? ?The following studies were reviewed today: ? ? ?EKG:  EKG is  ordered today.  The ekg ordered today demonstrates  ? ?NSR, RAD, RBBB ? ?Recent Labs: ?12/27/2020: ALT 13; TSH 2.88 ?05/09/2021: BUN 15; Creat 0.92; Hemoglobin 14.4; Platelets 253; Potassium 3.7; Sodium 135  ?Recent Lipid Panel ?    ?Component Value Date/Time  ? CHOL 191 12/27/2020 1118  ? TRIG 122 12/27/2020 1118  ? HDL 70 12/27/2020 1118  ? CHOLHDL 2.7 12/27/2020 1118  ? VLDL 36 (H) 11/25/2015 1039  ? [place] 99 12/27/2020 1118  ? ? ? ?Risk Assessment/Calculations:   ?  ? ?    ? ?Physical Exam:   ? ?VS:  ? ?Vitals:  ? 05/15/21 0835  ?BP: 140/61  ?Pulse: 79  ?SpO2: 95%  ? ? ? ?Wt Readings from Last 3 Encounters:  ?05/15/21 163 lb 3.2 oz (74 kg)  ?05/09/21 166 lb 8 oz (75.5 kg)  ?12/29/20 171 lb (77.6 kg)  ?  ? ?GEN:  Well nourished, well developed in no acute distress ?HEENT: Normal ?NECK: No JVD; No carotid bruits ?LYMPHATICS: No lymphadenopathy ?CARDIAC: RRR, no murmurs, rubs, gallops ?RESPIRATORY:  Clear to auscultation without rales, wheezing or rhonchi  ?ABDOMEN: Soft, non-tender, non-distended ?MUSCULOSKELETAL:  No edema; No deformity  ?SKIN: Warm and dry ?NEUROLOGIC:  Alert and oriented x 3 ?PSYCHIATRIC:  Normal affect  ? ?ASSESSMENT:   ? ?Pre-OP: She can do > 4 METS. She has low cardiac risk. She has no hx of cardiac disease. She is acceptable cardiac risk for TKA. Will monitor her RBBB, fairly wide QRS complex. ? ?PLAN:   ? ?In order of problems listed above: ? ?Acceptable cardiac risk for TKA ?Follow up in 6 months ? ?   ? ?Medication Adjustments/Labs and Tests Ordered: ?Current medicines are reviewed at length with the patient today.  Concerns regarding medicines are outlined above.  ?No orders of the defined types were placed in this encounter. ? ?No orders of the defined types were placed in this encounter. ? ? ?Patient Instructions  ?Medication Instructions:  ?Continue same medications ?*If you need a refill on your cardiac medications before your next appointment, please call your pharmacy* ? ? ?Lab Work: ?None ordered  ? ? ?Testing/Procedures: ?None ordered ? ? ?Follow-Up: ?At [facility], you and your health needs are our priority.  As part of our continuing mission to provide you with exceptional heart care, we have  created designated Provider Care Teams.  These Care Teams include your primary Cardiologist (physician) and Advanced Practice Providers (APPs -  Physician Assistants and Nurse Practitioners) who all work together to provide you with the care you need, when you need it. ? ?We recommend signing up for the patient portal called "MyChart".  Sign up information is provided on this After Visit Summary.  MyChart is used to connect  with patients for Virtual Visits (Telemedicine).  Patients are able to view lab/test results, encounter notes, upcoming appointments, etc.  Non-urgent messages can be sent to your provider as well.   ?To learn more about what you can do with MyChart, go to [email or web address].   ? ?Your next appointment:  6 months ?  ? ?The format for your next appointment: Office ? ? ?Provider:  Dr.Lyrik Buresh ? ? ?Important Information About Sugar ? ? ? ? ? ?  ? ?Signed, ?Janina Mayo, MD  ?05/15/2021 9:01 AM    ?[place] ?

## 2021-05-15 NOTE — Patient Instructions (Signed)
Medication Instructions:  ?Continue same medications ?*If you need a refill on your cardiac medications before your next appointment, please call your pharmacy* ? ? ?Lab Work: ?None ordered  ? ? ?Testing/Procedures: ?None ordered ? ? ?Follow-Up: ?At [facility], you and your health needs are our priority.  As part of our continuing mission to provide you with exceptional heart care, we have created designated Provider Care Teams.  These Care Teams include your primary Cardiologist (physician) and Advanced Practice Providers (APPs -  Physician Assistants and Nurse Practitioners) who all work together to provide you with the care you need, when you need it. ? ?We recommend signing up for the patient portal called "MyChart".  Sign up information is provided on this After Visit Summary.  MyChart is used to connect with patients for Virtual Visits (Telemedicine).  Patients are able to view lab/test results, encounter notes, upcoming appointments, etc.  Non-urgent messages can be sent to your provider as well.   ?To learn more about what you can do with MyChart, go to [email or web address].   ? ?Your next appointment:  6 months ?  ? ?The format for your next appointment: Office ? ? ?Provider:  Dr.Branch ? ? ?Important Information About Sugar ? ? ? ? ? ? ?

## 2021-05-16 ENCOUNTER — Other Ambulatory Visit: Payer: Self-pay | Admitting: Internal Medicine

## 2021-05-16 DIAGNOSIS — M19041 Primary osteoarthritis, right hand: Secondary | ICD-10-CM

## 2021-05-25 HISTORY — PX: REPLACEMENT TOTAL KNEE: SUR1224

## 2021-06-13 LAB — HM DIABETES EYE EXAM

## 2021-06-27 ENCOUNTER — Other Ambulatory Visit: Payer: Medicare Other

## 2021-06-27 DIAGNOSIS — Z01811 Encounter for preprocedural respiratory examination: Secondary | ICD-10-CM

## 2021-06-27 DIAGNOSIS — E038 Other specified hypothyroidism: Secondary | ICD-10-CM

## 2021-06-27 NOTE — Progress Notes (Signed)
Lab only 

## 2021-06-28 LAB — BASIC METABOLIC PANEL
BUN: 17 mg/dL (ref 7–25)
CO2: 24 mmol/L (ref 20–32)
Calcium: 10 mg/dL (ref 8.6–10.4)
Chloride: 105 mmol/L (ref 98–110)
Creat: 1 mg/dL (ref 0.60–1.00)
Glucose, Bld: 93 mg/dL (ref 65–99)
Potassium: 4.5 mmol/L (ref 3.5–5.3)
Sodium: 140 mmol/L (ref 135–146)

## 2021-06-28 LAB — LIPID PANEL
Cholesterol: 187 mg/dL (ref <200)
HDL: 62 mg/dL (ref >=50)
LDL Cholesterol (Calc): 104 mg/dL (calc) — ABNORMAL HIGH
Non-HDL Cholesterol (Calc): 125 mg/dL (calc) (ref <130)
Total CHOL/HDL Ratio: 3 (calc) (ref <5.0)
Triglycerides: 111 mg/dL (ref <150)

## 2021-06-28 LAB — HEMOGLOBIN A1C
Hgb A1c MFr Bld: 5.7 % of total Hgb — ABNORMAL HIGH (ref <5.7)
Mean Plasma Glucose: 117 mg/dL
eAG (mmol/L): 6.5 mmol/L

## 2021-06-29 ENCOUNTER — Encounter: Payer: Self-pay | Admitting: Internal Medicine

## 2021-06-29 ENCOUNTER — Ambulatory Visit (INDEPENDENT_AMBULATORY_CARE_PROVIDER_SITE_OTHER): Payer: Medicare Other | Admitting: Internal Medicine

## 2021-06-29 VITALS — BP 140/70 | HR 80 | Temp 97.3°F | Ht 65.0 in | Wt 159.8 lb

## 2021-06-29 DIAGNOSIS — I10 Essential (primary) hypertension: Secondary | ICD-10-CM | POA: Diagnosis not present

## 2021-06-29 DIAGNOSIS — E039 Hypothyroidism, unspecified: Secondary | ICD-10-CM

## 2021-06-29 DIAGNOSIS — R7302 Impaired glucose tolerance (oral): Secondary | ICD-10-CM | POA: Diagnosis not present

## 2021-06-29 DIAGNOSIS — I4510 Unspecified right bundle-branch block: Secondary | ICD-10-CM

## 2021-06-29 DIAGNOSIS — Z96652 Presence of left artificial knee joint: Secondary | ICD-10-CM | POA: Diagnosis not present

## 2021-06-29 NOTE — Progress Notes (Signed)
   Subjective:    Patient ID: Sharon Peters, female    DOB: 1940/10/03, 81 y.o.   MRN: [id number]  HPI 81 year old seen for follow up. Had left knee TKA May 11th by Dr. Latanya Maudlin. She noticed numbness left hand ulnar distribution after surgery and it is still there. No weakness in left 4 th or fifth finger.  Labs reviewed. Hgb AIC 5.7%. Lipid panel acceptable. LDL 104. BUN and creatinine are normal  Saw Cardiologist, Dr. Harl Bowie, for evaluation of RBBB and has follow up in 6 months.   Review of Systems no chest pain, shortness of breath, knee pain improving     Objective:   Physical Exam Was 171 pounds in December now 159 pounds, blood pressure 140/70, pulse 80, pulse oximetry 97% on room air Chest clear.  Cardiac exam: Regular rate and rhythm.  No pitting edema of the lower extremities.  Affect thought and judgment are normal.  No carotid bruits.      Assessment & Plan:   Left ulnar neuropathy- new since surgery. She says she noticed it right after surgery.  Have asked her to check with Orthopedist about this at next Ortho office visit.  Impaired glucose tolerance-stable hemoglobin A1c at 5.7%  Hyperlipidemia-lipid panel very good.  Continue watching diet.  Type 2 diabetes mellitus treated with metformin and Farxiga and stable and hemoglobin A1c in April was excellent at 6%  Essential hypertension stable on amlodipine  Hypothyroidism treated with levothyroxine and stable  She will continue to exercise and improve her stamina.  No change in medications.  Will return in December.

## 2021-08-02 NOTE — Patient Instructions (Signed)
You have done extremely well your surgery and managing your medical issues.  I am very pleased.  Continue to be more active.  Continue current medications.  Follow-up here in December.  Ask orthopedist about left ulnar neuropathy at your next visit.

## 2021-08-17 ENCOUNTER — Other Ambulatory Visit: Payer: Self-pay | Admitting: Internal Medicine

## 2021-08-17 DIAGNOSIS — M19041 Primary osteoarthritis, right hand: Secondary | ICD-10-CM

## 2021-08-24 ENCOUNTER — Emergency Department (HOSPITAL_COMMUNITY): Payer: Medicare Other

## 2021-08-24 ENCOUNTER — Telehealth: Payer: Self-pay | Admitting: Internal Medicine

## 2021-08-24 ENCOUNTER — Emergency Department (HOSPITAL_COMMUNITY)
Admission: EM | Admit: 2021-08-24 | Discharge: 2021-08-24 | Disposition: A | Discharge status: Home / Self Care | Payer: Medicare Other | Attending: Emergency Medicine | Admitting: Emergency Medicine

## 2021-08-24 DIAGNOSIS — W19XXXA Unspecified fall, initial encounter: Secondary | ICD-10-CM

## 2021-08-24 DIAGNOSIS — S0083XA Contusion of other part of head, initial encounter: Secondary | ICD-10-CM | POA: Diagnosis not present

## 2021-08-24 DIAGNOSIS — S43005A Unspecified dislocation of left shoulder joint, initial encounter: Secondary | ICD-10-CM | POA: Diagnosis not present

## 2021-08-24 DIAGNOSIS — S50311A Abrasion of right elbow, initial encounter: Secondary | ICD-10-CM | POA: Diagnosis not present

## 2021-08-24 DIAGNOSIS — Z7984 Long term (current) use of oral hypoglycemic drugs: Secondary | ICD-10-CM | POA: Diagnosis not present

## 2021-08-24 DIAGNOSIS — S4992XA Unspecified injury of left shoulder and upper arm, initial encounter: Secondary | ICD-10-CM | POA: Diagnosis present

## 2021-08-24 DIAGNOSIS — W01198A Fall on same level from slipping, tripping and stumbling with subsequent striking against other object, initial encounter: Secondary | ICD-10-CM | POA: Diagnosis not present

## 2021-08-24 DIAGNOSIS — T148XXA Other injury of unspecified body region, initial encounter: Secondary | ICD-10-CM

## 2021-08-24 MED ORDER — PROPOFOL 10 MG/ML IV BOLUS
0.5000 mg/kg | Freq: Once | INTRAVENOUS | Status: DC
  Filled 2021-08-24: qty 20

## 2021-08-24 MED ORDER — PROPOFOL 10 MG/ML IV BOLUS
INTRAVENOUS | Status: AC | PRN
  Administered 2021-08-24: 20 mg via INTRAVENOUS
  Administered 2021-08-24: 36 mg via INTRAVENOUS
  Administered 2021-08-24: 20 mg via INTRAVENOUS

## 2021-08-24 MED ORDER — FENTANYL CITRATE PF 50 MCG/ML IJ SOSY
50.0000 ug | PREFILLED_SYRINGE | Freq: Once | INTRAMUSCULAR | Status: AC
  Administered 2021-08-24: 50 ug via INTRAVENOUS
  Filled 2021-08-24: qty 1

## 2021-08-24 NOTE — ED Notes (Signed)
Urine sent to lab 

## 2021-08-24 NOTE — Telephone Encounter (Signed)
[facility] [phone]  Alanna called to say that the physical therapist was needing a order for physical therapy for [place] for Vertigo for insurance purposes. I ask if she had been treated recently for this and she said no, maybe 5 years ago you had told her to take dramamine. I let Alanna know that usually insurance companies want an office note to back up a order for physical therapy especially medicare. She is going to check into it and call back.

## 2021-08-24 NOTE — ED Triage Notes (Signed)
Ems brings pt in from home for a fall. States she fell into the wall when losing her balance. Left shoulder injury. Denies neck, denies back pain.

## 2021-08-24 NOTE — ED Provider Notes (Signed)
[facility] DEPT Provider Note   CSN: [id number] Arrival date & time: 08/24/21  1430     History  Chief Complaint  Patient presents with   Lytle Michaels    Sharon Peters is a 81 y.o. female.  Patient is a 81 year old female who presents after mechanical fall.  She said she was getting up to get a phone number and somehow tripped and fell falling into a wall.  She is not sure if she tripped on something her foot got caught.  She did not have any dizziness, chest pain or shortness of breath or other symptoms prior to the fall.  She fell into the wall and has pain in her left shoulder.  She also hit her head.  There is no loss of consciousness.  No neck or back pain.  She is not anticoagulants.  She is status post a left knee replacement in May of this year but denies any injury to her knee.  Her tetanus shot is up-to-date.       Home Medications Prior to Admission medications   Medication Sig Start Date End Date Taking? Authorizing Provider  acetaminophen (TYLENOL) 500 MG tablet Take 500 mg by mouth every 6 (six) hours as needed.    [provider]  amLODipine (NORVASC) 10 MG tablet Take 1 tablet by mouth daily. 06/23/20   [provider]  diclofenac sodium (VOLTAREN) 1 % GEL Apply 2 g topically 3 (three) times daily. Use two tubes per month. 09/23/18   Elby Showers, MD  diclofenac Sodium (VOLTAREN) 1 % GEL APPLY 4 GRAMS TOPICALLY TO THE AFFECTED AREA FOUR TIMES DAILY 08/17/21   Elby Showers, MD  FARXIGA 5 MG TABS tablet Take 5 mg by mouth daily. 06/23/20   [provider]  hydrocortisone 2.5 % cream APPLY TO THE AFFECTED AREA ON FACE TWICE DAILY FOR UP TO 1 WEEK ON AND 1 WEEK OFF AS NEEDED 01/27/19   [provider]  levothyroxine (SYNTHROID) 75 MCG tablet TAKE 1 TABLET BY MOUTH 30 MINUTES BEFORE BREAKFAST ON AN EMPTY STOMACH 03/23/21   Elby Showers, MD  metFORMIN (GLUCOPHAGE) 500 MG tablet TAKE 1 TABLET BY MOUTH EVERY DAY WITH  BREAKFAST 03/23/21   Elby Showers, MD  ONE TOUCH ULTRA TEST test strip USE DAILY TO TEST GLUCOSE 10/03/17   [provider]  senna (SENOKOT) 8.6 MG tablet Take 1 tablet by mouth as needed for constipation.    [provider]  tiZANidine (ZANAFLEX) 4 MG capsule Take 4 mg by mouth 3 (three) times daily.    [provider]  traMADol (ULTRAM) 50 MG tablet TAKE 1 TABLET(50 MG) BY MOUTH EVERY 8 HOURS AS NEEDED FOR SEVERE PAIN 08/17/21   Elby Showers, MD  zolpidem (AMBIEN) 5 MG tablet TAKE ONE TABLET BY MOUTH AT BEDTIME AS NEEDED FOR SLEEP 05/16/21   Elby Showers, MD      Allergies    Celecoxib    Review of Systems   Review of Systems  Constitutional:  Negative for fever.  Gastrointestinal:  Negative for nausea and vomiting.  Musculoskeletal:  Positive for arthralgias and joint swelling. Negative for back pain and neck pain.  Skin:  Positive for wound.  Neurological:  Positive for headaches. Negative for weakness and numbness.    Physical Exam Updated Vital Signs BP (!) 125/57   Pulse 70   Temp 98.2 F (36.8 C)   Resp 13   Ht 5' 5.5" (1.664  m)   Wt 72.1 kg   SpO2 96%   BMI 26.06 kg/m  Physical Exam Constitutional:      Appearance: She is well-developed.  HENT:     Head: Normocephalic.     Comments: Hematoma to the left forehead, there is superficial abrasion to the mid forehead, no active bleeding Eyes:     Pupils: Pupils are equal, round, and reactive to light.  Neck:     Comments: No pain to the cervical, thoracic or lumbosacral spine Cardiovascular:     Rate and Rhythm: Normal rate and regular rhythm.     Heart sounds: Normal heart sounds.  Pulmonary:     Effort: Pulmonary effort is normal. No respiratory distress.     Breath sounds: Normal breath sounds. No wheezing or rales.  Chest:     Chest wall: No tenderness.  Abdominal:     General: Bowel sounds are normal.     Palpations: Abdomen is soft.     Tenderness: There is no abdominal  tenderness. There is no guarding or rebound.  Musculoskeletal:        General: Normal range of motion.     Comments: Positive pain and deformity to the left shoulder, no pain to the elbow or wrist.  No other pain on palpation or range of motion of the extremities.  There is a small abrasion to the right elbow but no underlying bony tenderness.  Lymphadenopathy:     Cervical: No cervical adenopathy.  Skin:    General: Skin is warm and dry.     Findings: No rash.  Neurological:     General: No focal deficit present.     Mental Status: She is alert and oriented to person, place, and time.     ED Results / Procedures / Treatments   Labs (all labs ordered are listed, but only abnormal results are displayed) Labs Reviewed - No data to display  EKG EKG Interpretation  Date/Time:  Thursday August 24 2021 14:43:13 EDT Ventricular Rate:  88 PR Interval:  164 QRS Duration: 143 QT Interval:  401 QTC Calculation: 486 R Axis:   100 Text Interpretation: Sinus rhythm RBBB and LPFB Baseline wander in lead(s) V1 no change as compared to EKG from 05/2021 Confirmed by Malvin Johns [id number]) on 08/24/2021 4:34:51 PM  Radiology DG Shoulder Left Portable  Result Date: 08/24/2021 CLINICAL DATA:  Shoulder dislocation, postreduction. EXAM: LEFT SHOULDER COMPARISON:  Pre reduction radiographs earlier today. FINDINGS: Reduction of previous anterior shoulder dislocation. Hill-Sachs impaction injury on prior exam is not well-defined given projection. No visible bony [place]. Mild acromioclavicular degenerative change. IMPRESSION: Reduction of previous anterior shoulder dislocation. Electronically Signed   By: Keith Rake M.D.   On: 08/24/2021 18:59   CT Head Wo Contrast  Result Date: 08/24/2021 CLINICAL DATA:  Provided history: Head trauma, minor. Neck trauma. Additional history provided: Fall. EXAM: CT HEAD WITHOUT CONTRAST CT CERVICAL SPINE WITHOUT CONTRAST TECHNIQUE: Multidetector CT imaging of the  head and cervical spine was performed following the standard protocol without intravenous contrast. Multiplanar CT image reconstructions of the cervical spine were also generated. RADIATION DOSE REDUCTION: This exam was performed according to the departmental dose-optimization program which includes automated exposure control, adjustment of the mA and/or kV according to patient size and/or use of iterative reconstruction technique. COMPARISON:  None. FINDINGS: CT HEAD FINDINGS Brain: Mild generalized cerebral atrophy. There is no acute intracranial hemorrhage. No demarcated cortical infarct. No extra-axial fluid collection. No evidence of an intracranial mass. No  midline shift. Vascular: No hyperdense vessel. Atherosclerotic calcifications. Skull: No fracture or aggressive osseous lesion. Sinuses/Orbits: No mass or acute finding within the imaged orbits. No significant paranasal sinus disease at the imaged levels. Other: Small forehead hematoma. CT CERVICAL SPINE FINDINGS Alignment: Nonspecific straightening of the expected cervical lordosis. No significant spondylolisthesis. Skull base and vertebrae: The basion-dental and atlanto-dental intervals are maintained.No evidence of acute fracture to the cervical spine. Soft tissues and spinal canal: No prevertebral fluid or swelling. No visible canal hematoma. Disc levels: Cervical spondylosis with multilevel disc space narrowing, disc bulges, posterior disc osteophytes, endplate spurring, uncovertebral hypertrophy and facet arthrosis. Disc space narrowing is greatest at C6-C7 (advanced at this level). No appreciable high-grade spinal canal stenosis. Multilevel bony neural foraminal narrowing. Upper chest: No consolidation within the imaged lung apices. No visible pneumothorax. IMPRESSION: CT head: 1. No evidence of acute intracranial abnormality. 2. Small forehead hematoma. 3. Mild generalized cerebral atrophy. CT cervical spine: 1. No evidence of acute fracture to the  cervical spine. 2. Nonspecific straightening of the expected cervical lordosis. 3. Cervical spondylosis, as described. Electronically Signed   By: Kellie Simmering D.O.   On: 08/24/2021 17:47   CT Cervical Spine Wo Contrast  Result Date: 08/24/2021 CLINICAL DATA:  Provided history: Head trauma, minor. Neck trauma. Additional history provided: Fall. EXAM: CT HEAD WITHOUT CONTRAST CT CERVICAL SPINE WITHOUT CONTRAST TECHNIQUE: Multidetector CT imaging of the head and cervical spine was performed following the standard protocol without intravenous contrast. Multiplanar CT image reconstructions of the cervical spine were also generated. RADIATION DOSE REDUCTION: This exam was performed according to the departmental dose-optimization program which includes automated exposure control, adjustment of the mA and/or kV according to patient size and/or use of iterative reconstruction technique. COMPARISON:  None. FINDINGS: CT HEAD FINDINGS Brain: Mild generalized cerebral atrophy. There is no acute intracranial hemorrhage. No demarcated cortical infarct. No extra-axial fluid collection. No evidence of an intracranial mass. No midline shift. Vascular: No hyperdense vessel. Atherosclerotic calcifications. Skull: No fracture or aggressive osseous lesion. Sinuses/Orbits: No mass or acute finding within the imaged orbits. No significant paranasal sinus disease at the imaged levels. Other: Small forehead hematoma. CT CERVICAL SPINE FINDINGS Alignment: Nonspecific straightening of the expected cervical lordosis. No significant spondylolisthesis. Skull base and vertebrae: The basion-dental and atlanto-dental intervals are maintained.No evidence of acute fracture to the cervical spine. Soft tissues and spinal canal: No prevertebral fluid or swelling. No visible canal hematoma. Disc levels: Cervical spondylosis with multilevel disc space narrowing, disc bulges, posterior disc osteophytes, endplate spurring, uncovertebral hypertrophy and  facet arthrosis. Disc space narrowing is greatest at C6-C7 (advanced at this level). No appreciable high-grade spinal canal stenosis. Multilevel bony neural foraminal narrowing. Upper chest: No consolidation within the imaged lung apices. No visible pneumothorax. IMPRESSION: CT head: 1. No evidence of acute intracranial abnormality. 2. Small forehead hematoma. 3. Mild generalized cerebral atrophy. CT cervical spine: 1. No evidence of acute fracture to the cervical spine. 2. Nonspecific straightening of the expected cervical lordosis. 3. Cervical spondylosis, as described. Electronically Signed   By: Kellie Simmering D.O.   On: 08/24/2021 17:47   DG Shoulder Left  Result Date: 08/24/2021 CLINICAL DATA:  Provided history: Fall, shoulder pain. EXAM: LEFT SHOULDER - 2+ VIEW COMPARISON:  No pertinent prior exams available for comparison. FINDINGS: Anterior shoulder dislocation. A small Hill-Sachs deformity is questioned. Mild acromioclavicular joint degenerative arthrosis. IMPRESSION: Anterior shoulder dislocation with possible small Hill-Sachs deformity. Electronically Signed   By: Kellie Simmering D.O.  On: 08/24/2021 16:54    Procedures Reduction of dislocation  Date/Time: 08/24/2021 8:20 PM  Performed by: Malvin Johns, MD Authorized by: Malvin Johns, MD  Consent: Written consent obtained. Risks and benefits: risks, benefits and alternatives were discussed Consent given by: patient Patient understanding: patient states understanding of the procedure being performed Patient consent: the patient's understanding of the procedure matches consent given Procedure consent: procedure consent matches procedure scheduled Patient identity confirmed: verbally with patient Preparation: Patient was prepped and draped in the usual sterile fashion. Local anesthesia used: no  Anesthesia: Local anesthesia used: no  Sedation: Patient sedated: yes Sedation type: moderate (conscious) sedation Sedatives:  propofol Analgesia: fentanyl Vitals: Vital signs were monitored during sedation.  Patient tolerance: patient tolerated the procedure well with no immediate complications   .Sedation  Date/Time: 08/24/2021 8:21 PM  Performed by: Malvin Johns, MD Authorized by: Malvin Johns, MD   Consent:    Consent obtained:  Written   Consent given by:  Patient   Risks discussed:  Allergic reaction, respiratory compromise necessitating ventilatory assistance and intubation and vomiting Universal protocol:    Immediately prior to procedure, a time out was called: yes     Patient identity confirmed:  Verbally with patient Indications:    Procedure performed:  Dislocation reduction   Procedure necessitating sedation performed by:  Physician performing sedation Pre-sedation assessment:    Time since last food or drink:  6   ASA classification: class 2 - patient with mild systemic disease     Mouth opening:  3 or more finger widths   Thyromental distance:  3 finger widths   Mallampati score:  I - soft palate, uvula, fauces, pillars visible   Neck mobility: normal     Pre-sedation assessments completed and reviewed: airway patency, cardiovascular function, hydration status, mental status, nausea/vomiting, pain level, respiratory function and temperature     Pre-sedation assessment completed:  08/24/2021 6:00 PM Immediate pre-procedure details:    Reassessment: Patient reassessed immediately prior to procedure     Reviewed: vital signs and NPO status     Verified: bag valve mask available, emergency equipment available, intubation equipment available, IV patency confirmed and oxygen available   Procedure details (see MAR for exact dosages):    Preoxygenation:  Nasal cannula   Sedation:  Propofol   Intended level of sedation: deep   Analgesia:  Fentanyl   Intra-procedure monitoring:  Blood pressure monitoring, cardiac monitor, continuous capnometry, continuous pulse oximetry, frequent LOC  assessments and frequent vital sign checks   Intra-procedure events: none     Total Provider sedation time (minutes):  15 Post-procedure details:    Post-sedation assessment completed:  08/24/2021 8:23 PM   Attendance: Constant attendance by certified staff until patient recovered     Recovery: Patient returned to pre-procedure baseline     Post-sedation assessments completed and reviewed: airway patency, cardiovascular function, hydration status, mental status, nausea/vomiting, pain level, respiratory function and temperature     Patient is stable for discharge or admission: yes     Procedure completion:  Tolerated well, no immediate complications     Medications Ordered in ED Medications  propofol (DIPRIVAN) 10 mg/mL bolus/IV push 36.1 mg (36.1 mg Intravenous Not Given 08/24/21 1917)  fentaNYL (SUBLIMAZE) injection 50 mcg (50 mcg Intravenous Given 08/24/21 1654)  propofol (DIPRIVAN) 10 mg/mL bolus/IV push (20 mg Intravenous Given 08/24/21 1833)    ED Course/ Medical Decision Making/ A&P  Medical Decision Making Amount and/or Complexity of Data Reviewed Radiology: ordered.  Risk Prescription drug management.   Patient is a 81 year old female who presents with a forehead abrasion and shoulder pain after a mechanical fall.  She had a CT scan of her head and cervical spine which show no acute abnormalities.  No intracranial hemorrhage or cervical spine injury.  X-rays of her left shoulder were done.  These were interpreted by me and confirmed by the radiologist to show an anterior shoulder dislocation with a possible Hill-Sachs deformity.  Shoulder was reduced in the ED.  She did well.  Was placed in a shoulder sling.  Follow-up x-rays show positive reduction of the fracture.  She was advised to stay in the shoulder sling until she follows up with orthopedist.  She has an orthopedist given that she had recent knee surgery.  Return precautions were given.  Final  Clinical Impression(s) / ED Diagnoses Final diagnoses:  Fall, initial encounter  Abrasion  Dislocation of left shoulder joint, initial encounter    Rx / DC Orders ED Discharge Orders     None         Malvin Johns, MD 08/24/21 2025

## 2021-08-24 NOTE — ED Notes (Signed)
Patient A&O x4. Able to move all extremities except left arm due to in sling from dislocation. Patient on RA. No distress noted

## 2021-08-25 NOTE — Telephone Encounter (Signed)
scheduled

## 2021-08-25 NOTE — Telephone Encounter (Signed)
Sharon Peters called today to say yesterday she slipped and dislocated her shoulder and had to go to emergency room and adjusted and she has not recouped from knee surgery and now surgery issues.   She is having dizzy spells, weak and her balance is off. She wanted to know if you could recommend something or call in something for her like you did 4-5 years ago and I let her know you would probably need to see her with all these issues going on and the fact she was wanting to have physical therapy for it also.

## 2021-08-31 ENCOUNTER — Ambulatory Visit (INDEPENDENT_AMBULATORY_CARE_PROVIDER_SITE_OTHER): Payer: Medicare Other | Admitting: Internal Medicine

## 2021-08-31 ENCOUNTER — Encounter: Payer: Self-pay | Admitting: Internal Medicine

## 2021-08-31 VITALS — Temp 98.5°F | Wt 159.0 lb

## 2021-08-31 DIAGNOSIS — Z96652 Presence of left artificial knee joint: Secondary | ICD-10-CM

## 2021-08-31 DIAGNOSIS — N182 Chronic kidney disease, stage 2 (mild): Secondary | ICD-10-CM

## 2021-08-31 DIAGNOSIS — E039 Hypothyroidism, unspecified: Secondary | ICD-10-CM

## 2021-08-31 DIAGNOSIS — I10 Essential (primary) hypertension: Secondary | ICD-10-CM

## 2021-08-31 DIAGNOSIS — R7302 Impaired glucose tolerance (oral): Secondary | ICD-10-CM | POA: Diagnosis not present

## 2021-08-31 DIAGNOSIS — H8110 Benign paroxysmal vertigo, unspecified ear: Secondary | ICD-10-CM | POA: Diagnosis not present

## 2021-08-31 DIAGNOSIS — M7918 Myalgia, other site: Secondary | ICD-10-CM

## 2021-08-31 MED ORDER — MECLIZINE HCL 25 MG PO TABS: 25.0000 mg | ORAL_TABLET | Freq: Three times a day (TID) | ORAL | 0 refills | Status: DC | PRN

## 2021-08-31 NOTE — Progress Notes (Signed)
   Subjective:    Patient ID: Jerolyn Shin, female    DOB: 12-Nov-1940, 81 y.o.   MRN: [id number]  HPI 81 year old Female seen for vertigo which lad to a fall on August 10th. Had dislocated left shoulder as a result.  Was seen in the Emergency department on August 10 after mechanical fall at her home.  She was getting up from a chair and somehow fell unsteady on her feet and fell into a wall.  Denies chest pain or shortness of breath.  Was not sure if she tripped or got her foot caught somehow.  She did strike her head but had no loss of consciousness.  Was diagnosed with left anterior shoulder dislocation.  She had CT of the head without contrast which was negative and CT of C-spine which was negative.  Left shoulder showed anterior shoulder dislocation and a small Hill-Sachs deformity question.  Had mild AC joint degenerative arthrosis.  She has follow-up with [facility].  She is wearing a sling.  On May 11, underwent left knee arthroplasty at surgery center on [place].  Says knee is okay.  Has been going to physical therapy for her knee.  Reports that she has had vertigo previously but not recently. Chest pain, shortness of breath, melena or bright red blood per rectum.  She is not on chronic anticoagulation.     Review of Systems Takes Tramadol for pain. Had vertigo in August for a week last summer and took Dramamine. Now says she feels woozy- or unsteady with balance. Some issues with balance at PT since knee replacement.      Objective:   Physical Exam  Vital signs reviewed.  Extraocular movements are full.  PERRLA. She did have orthostasis with sitting from supine position today with a 15 mm drop.  She has mild leftward nystagmus.  Moves all 4 extremities and muscle strength in the upper and lower extremities appears to be grossly normal.     Assessment & Plan:  Orthostasis that is symptomatic  Plan: We plan to draw CBC with differential c-Met and TSH tomorrow when  phlebotomist is present.  She is to stay well-hydrated at all times and to be careful with position change.  Have prescribed  25 mg 3 times daily.  In the emergency department CT of head without contrast and CT of C-spine were normal.  She is concerned that she does not have a cause for the vertigo.  Says she has had vertigo previously.  History of left ventricular diastolic dysfunction but no history of dysrhythmia  History of type 2 diabetes mellitus treated with [place] and metformin  Essential hypertension treated with amlodipine  Hypothyroidism treated with levothyroxine 75 mcg daily  History of chronic kidney disease which is being observed  Patient will be referred to Neurology for evaluation of her vertigo.  She is anxious to find out what is causing this.  It may just be episodic benign positional vertigo.  Status post left TKA

## 2021-08-31 NOTE — Patient Instructions (Addendum)
Labs drawn and pending.  Patient may take meclizine 25 mg up to 3 times daily as needed for vertigo.  Be careful moving about her home with vertigo.  CBC c-Met and TSH drawn today.  Stay well-hydrated.  Health maintenance exam scheduled for December along with Medicare wellness visit.

## 2021-09-01 ENCOUNTER — Other Ambulatory Visit: Payer: Medicare Other

## 2021-09-01 DIAGNOSIS — H8110 Benign paroxysmal vertigo, unspecified ear: Secondary | ICD-10-CM

## 2021-09-01 NOTE — Addendum Note (Signed)
Addended by: Geradine Girt D on: 09/01/2021 12:25 PM   Modules accepted: Orders

## 2021-09-02 LAB — CBC WITH DIFFERENTIAL/PLATELET
Absolute Monocytes: 984 cells/uL — ABNORMAL HIGH (ref 200–950)
Basophils Absolute: 40 cells/uL (ref 0–200)
Basophils Relative: 0.5 %
Eosinophils Absolute: 40 cells/uL (ref 15–500)
Eosinophils Relative: 0.5 %
HCT: 40.6 % (ref 35.0–45.0)
Hemoglobin: 13.5 g/dL (ref 11.7–15.5)
Lymphs Abs: 1944 cells/uL (ref 850–3900)
MCH: 29.5 pg (ref 27.0–33.0)
MCHC: 33.3 g/dL (ref 32.0–36.0)
MCV: 88.8 fL (ref 80.0–100.0)
MPV: 10.5 fL (ref 7.5–12.5)
Monocytes Relative: 12.3 %
Neutro Abs: 4992 cells/uL (ref 1500–7800)
Neutrophils Relative %: 62.4 %
Platelets: 284 10*3/uL (ref 140–400)
RBC: 4.57 10*6/uL (ref 3.80–5.10)
RDW: 12.4 % (ref 11.0–15.0)
Total Lymphocyte: 24.3 %
WBC: 8 10*3/uL (ref 3.8–10.8)

## 2021-09-02 LAB — COMPLETE METABOLIC PANEL WITH GFR
AG Ratio: 1.7 (calc) (ref 1.0–2.5)
ALT: 12 U/L (ref 6–29)
AST: 17 U/L (ref 10–35)
Albumin: 4.3 g/dL (ref 3.6–5.1)
Alkaline phosphatase (APISO): 76 U/L (ref 37–153)
BUN/Creatinine Ratio: 17 (calc) (ref 6–22)
BUN: 17 mg/dL (ref 7–25)
CO2: 24 mmol/L (ref 20–32)
Calcium: 9.6 mg/dL (ref 8.6–10.4)
Chloride: 98 mmol/L (ref 98–110)
Creat: 1.01 mg/dL — ABNORMAL HIGH (ref 0.60–0.95)
Globulin: 2.5 g/dL (calc) (ref 1.9–3.7)
Glucose, Bld: 93 mg/dL (ref 65–99)
Potassium: 5.3 mmol/L (ref 3.5–5.3)
Sodium: 133 mmol/L — ABNORMAL LOW (ref 135–146)
Total Bilirubin: 0.7 mg/dL (ref 0.2–1.2)
Total Protein: 6.8 g/dL (ref 6.1–8.1)
eGFR: 56 mL/min/{1.73_m2} — ABNORMAL LOW (ref >=60)

## 2021-09-02 LAB — TSH: TSH: 3.57 mIU/L (ref 0.40–4.50)

## 2021-09-21 ENCOUNTER — Other Ambulatory Visit: Payer: Self-pay | Admitting: Internal Medicine

## 2021-11-21 ENCOUNTER — Ambulatory Visit: Payer: Medicare Other | Attending: Internal Medicine | Admitting: Internal Medicine

## 2021-11-21 ENCOUNTER — Encounter: Payer: Self-pay | Admitting: Internal Medicine

## 2021-11-21 VITALS — BP 126/62 | HR 68 | Ht 65.0 in | Wt 166.0 lb

## 2021-11-21 DIAGNOSIS — Z01810 Encounter for preprocedural cardiovascular examination: Secondary | ICD-10-CM | POA: Diagnosis not present

## 2021-11-21 NOTE — Patient Instructions (Signed)
Medication Instructions:  No Changes In Medications at this time.  *If you need a refill on your cardiac medications before your next appointment, please call your pharmacy*  Lab Work: None Ordered At This Time.  If you have labs (blood work) drawn today and your tests are completely normal, you will receive your results only by: [place] (if you have MyChart) OR A paper copy in the mail If you have any lab test that is abnormal or we need to change your treatment, we will call you to review the results.  Testing/Procedures: None Ordered At This Time.   Follow-Up: At [facility], you and your health needs are our priority.  As part of our continuing mission to provide you with exceptional heart care, we have created designated Provider Care Teams.  These Care Teams include your primary Cardiologist (physician) and Advanced Practice Providers (APPs -  Physician Assistants and Nurse Practitioners) who all work together to provide you with the care you need, when you need it.  Your next appointment:   AS NEEDED   The format for your next appointment:   In Person  Provider:   Janina Mayo, MD

## 2021-11-21 NOTE — Progress Notes (Signed)
Cardiology Office Note:    Date:  11/21/2021   ID:  Sharon Peters, DOB [birth year], MRN [id number]  PCP:  Elby Showers, MD   [facility] Providers Cardiologist:  Janina Mayo, MD     Referring MD: Elby Showers, MD   Chief Complaint  Patient presents with   Follow-up  Pre-op  History of Present Illness:    Sharon Peters is a 81 y.o. female with a hx of HTN, hypothyroid, DMII, referral for pre-op for L TKA  EKG 05/10/2011: NSR, LAE, RBBB  She denies CP or SOB with > 4 METs. She climbs up slowly. She does walking well up to a mile.  Minor palpitations. No syncope.She has hx of vertigo.  Blood pressures good control typically. 120s-130 at home.  Former patient of Dr. Ron Parker. Saw for abnormalities on her EKG. RVH, iRBBB.  Her echo was normal; with normal RV size and function. He noted with her normal echo in 2014 that she did not need further cardiology FU at that time.   12/27/2020 TC 191 mg/dL LDL 99 mg/dL  TSH normal.   Interim hx 11/7 Had L TKA. No cardiac events. She continues to exercise. Had recent mechanical fall. Has L anterior shoulder dislocation. No cardiac symptoms. Blood pressure is in good control.  Past Medical History:  Diagnosis Date   Chest discomfort    Nuclear, December, 2008, normal   Diabetes mellitus type II    Ejection fraction    EF 65%, February, 2010, trivial pericardial effusion,   HTN (hypertension)    Hypothyroidism    Obesity    Pericardial effusion    Trivial, echo, February, 2011  /     small...int he past...unexplained...normal sedimentation rate and ANA    Past Surgical History:  Procedure Laterality Date   left arthroscopic knee surgery     REPLACEMENT TOTAL KNEE Left 05/25/2021   TUBAL LIGATION      Current Medications: Current Meds  Medication Sig   acetaminophen (TYLENOL) 500 MG tablet Take 500 mg by mouth every 6 (six) hours as needed.   amLODipine (NORVASC) 10 MG tablet Take 1 tablet by mouth daily.    diclofenac Sodium (VOLTAREN) 1 % GEL APPLY 4 GRAMS TOPICALLY TO THE AFFECTED AREA FOUR TIMES DAILY   FARXIGA 5 MG TABS tablet Take 5 mg by mouth daily.   hydrocortisone 2.5 % cream APPLY TO THE AFFECTED AREA ON FACE TWICE DAILY FOR UP TO 1 WEEK ON AND 1 WEEK OFF AS NEEDED   levothyroxine (SYNTHROID) 75 MCG tablet TAKE 1 TABLET BY MOUTH 30 MINUTES BEFORE BREAKFAST ON AN EMPTY STOMACH   metFORMIN (GLUCOPHAGE) 500 MG tablet TAKE 1 TABLET BY MOUTH EVERY DAY WITH BREAKFAST   ONE TOUCH ULTRA TEST test strip USE DAILY TO TEST GLUCOSE   senna (SENOKOT) 8.6 MG tablet Take 1 tablet by mouth as needed for constipation.   tiZANidine (ZANAFLEX) 4 MG capsule Take 4 mg by mouth 3 (three) times daily.   traMADol (ULTRAM) 50 MG tablet TAKE 1 TABLET(50 MG) BY MOUTH EVERY 8 HOURS AS NEEDED FOR SEVERE PAIN   zolpidem (AMBIEN) 5 MG tablet TAKE ONE TABLET BY MOUTH AT BEDTIME AS NEEDED FOR SLEEP     Allergies:   Celecoxib   Social History   Socioeconomic History   Marital status: Divorced    Spouse name: Not on file   Number of children: Not on file   Years of education: Not on file  Highest education level: Not on file  Occupational History   Occupation: retired  Tobacco Use   Smoking status: Never   Smokeless tobacco: Never  Substance and Sexual Activity   Alcohol use: Yes    Comment: rarely   Drug use: Not on file   Sexual activity: Not on file  Other Topics Concern   Not on file  Social History Narrative   Not on file   Social Determinants of Health   Financial Resource Strain: Not on file  Food Insecurity: Not on file  Transportation Needs: Not on file  Physical Activity: Not on file  Stress: Not on file  Social Connections: Not on file     Family History: The patient's family history includes Cancer in her father, mother, and another family member; Diabetes in an other family member.  ROS:   Please see the history of present illness.     All other systems reviewed and are  negative.  EKGs/Labs/Other Studies Reviewed:    The following studies were reviewed today:   EKG:  EKG is  ordered today.  The ekg ordered today demonstrates   05/15/2021-NSR, RAD, RBBB  Recent Labs: 09/01/2021: ALT 12; BUN 17; Creat 1.01; Hemoglobin 13.5; Platelets 284; Potassium 5.3; Sodium 133; TSH 3.57   Recent Lipid Panel    Component Value Date/Time   CHOL 187 06/27/2021 1118   TRIG 111 06/27/2021 1118   HDL 62 06/27/2021 1118   CHOLHDL 3.0 06/27/2021 1118   VLDL 36 (H) 11/25/2015 1039   LDLCALC 104 (H) 06/27/2021 1118     Risk Assessment/Calculations:           Physical Exam:    VS:   Vitals:   11/21/21 0953  BP: 126/62  Pulse: 68  SpO2: 97%     Wt Readings from Last 3 Encounters:  11/21/21 166 lb (75.3 kg)  08/31/21 159 lb (72.1 kg)  08/24/21 159 lb (72.1 kg)     GEN:  Well nourished, well developed in no acute distress HEENT: Normal NECK: No JVD; No carotid bruits LYMPHATICS: No lymphadenopathy CARDIAC: RRR, no murmurs, rubs, gallops RESPIRATORY:  Clear to auscultation without rales, wheezing or rhonchi  ABDOMEN: Soft, non-tender, non-distended MUSCULOSKELETAL:  No edema; No deformity  SKIN: Warm and dry NEUROLOGIC:  Alert and oriented x 3 PSYCHIATRIC:  Normal affect   ASSESSMENT:    Prior Pre-op.  No cardiac issues.   PLAN:    In order of problems listed above:  Follow up PRN      Medication Adjustments/Labs and Tests Ordered: Current medicines are reviewed at length with the patient today.  Concerns regarding medicines are outlined above.  No orders of the defined types were placed in this encounter.  No orders of the defined types were placed in this encounter.   Patient Instructions  Medication Instructions:  No Changes In Medications at this time.  *If you need a refill on your cardiac medications before your next appointment, please call your pharmacy*  Lab Work: None Ordered At This Time.  If you have labs (blood  work) drawn today and your tests are completely normal, you will receive your results only by: [place] (if you have MyChart) OR A paper copy in the mail If you have any lab test that is abnormal or we need to change your treatment, we will call you to review the results.  Testing/Procedures: None Ordered At This Time.   Follow-Up: At [facility], you and your health needs  are our priority.  As part of our continuing mission to provide you with exceptional heart care, we have created designated Provider Care Teams.  These Care Teams include your primary Cardiologist (physician) and Advanced Practice Providers (APPs -  Physician Assistants and Nurse Practitioners) who all work together to provide you with the care you need, when you need it.  Your next appointment:   AS NEEDED   The format for your next appointment:   In Person  Provider:   Janina Mayo, MD          Signed, Janina Mayo, MD  11/21/2021 10:13 AM    [place]

## 2021-12-12 ENCOUNTER — Other Ambulatory Visit: Payer: Self-pay | Admitting: Internal Medicine

## 2021-12-12 DIAGNOSIS — M19041 Primary osteoarthritis, right hand: Secondary | ICD-10-CM

## 2021-12-26 ENCOUNTER — Other Ambulatory Visit: Payer: Medicare Other

## 2021-12-26 DIAGNOSIS — R7302 Impaired glucose tolerance (oral): Secondary | ICD-10-CM

## 2021-12-26 DIAGNOSIS — Z13220 Encounter for screening for lipoid disorders: Secondary | ICD-10-CM

## 2021-12-26 DIAGNOSIS — E039 Hypothyroidism, unspecified: Secondary | ICD-10-CM

## 2021-12-26 DIAGNOSIS — N182 Chronic kidney disease, stage 2 (mild): Secondary | ICD-10-CM

## 2021-12-26 DIAGNOSIS — I10 Essential (primary) hypertension: Secondary | ICD-10-CM

## 2021-12-27 LAB — CBC WITH DIFFERENTIAL/PLATELET
Absolute Monocytes: 576 cells/uL (ref 200–950)
Basophils Absolute: 32 cells/uL (ref 0–200)
Basophils Relative: 0.7 %
Eosinophils Absolute: 81 cells/uL (ref 15–500)
Eosinophils Relative: 1.8 %
HCT: 41.6 % (ref 35.0–45.0)
Hemoglobin: 14 g/dL (ref 11.7–15.5)
Lymphs Abs: 1242 cells/uL (ref 850–3900)
MCH: 29.2 pg (ref 27.0–33.0)
MCHC: 33.7 g/dL (ref 32.0–36.0)
MCV: 86.7 fL (ref 80.0–100.0)
MPV: 11 fL (ref 7.5–12.5)
Monocytes Relative: 12.8 %
Neutro Abs: 2570 cells/uL (ref 1500–7800)
Neutrophils Relative %: 57.1 %
Platelets: 236 10*3/uL (ref 140–400)
RBC: 4.8 10*6/uL (ref 3.80–5.10)
RDW: 12.1 % (ref 11.0–15.0)
Total Lymphocyte: 27.6 %
WBC: 4.5 10*3/uL (ref 3.8–10.8)

## 2021-12-27 LAB — COMPLETE METABOLIC PANEL WITH GFR
AG Ratio: 1.9 (calc) (ref 1.0–2.5)
ALT: 12 U/L (ref 6–29)
AST: 17 U/L (ref 10–35)
Albumin: 4.6 g/dL (ref 3.6–5.1)
Alkaline phosphatase (APISO): 68 U/L (ref 37–153)
BUN: 18 mg/dL (ref 7–25)
CO2: 26 mmol/L (ref 20–32)
Calcium: 9.8 mg/dL (ref 8.6–10.4)
Chloride: 102 mmol/L (ref 98–110)
Creat: 0.93 mg/dL (ref 0.60–0.95)
Globulin: 2.4 g/dL (calc) (ref 1.9–3.7)
Glucose, Bld: 107 mg/dL — ABNORMAL HIGH (ref 65–99)
Potassium: 4.6 mmol/L (ref 3.5–5.3)
Sodium: 137 mmol/L (ref 135–146)
Total Bilirubin: 0.8 mg/dL (ref 0.2–1.2)
Total Protein: 7 g/dL (ref 6.1–8.1)
eGFR: 62 mL/min/{1.73_m2} (ref >=60)

## 2021-12-27 LAB — MICROALBUMIN / CREATININE URINE RATIO
Creatinine, Urine: 114 mg/dL (ref 20–275)
Microalb Creat Ratio: 6 mcg/mg creat (ref <30)
Microalb, Ur: 0.7 mg/dL

## 2021-12-27 LAB — LIPID PANEL
Cholesterol: 190 mg/dL (ref <200)
HDL: 72 mg/dL (ref >=50)
LDL Cholesterol (Calc): 99 mg/dL (calc)
Non-HDL Cholesterol (Calc): 118 mg/dL (calc) (ref <130)
Total CHOL/HDL Ratio: 2.6 (calc) (ref <5.0)
Triglycerides: 101 mg/dL (ref <150)

## 2021-12-27 LAB — HEMOGLOBIN A1C
Hgb A1c MFr Bld: 6.1 % of total Hgb — ABNORMAL HIGH (ref <5.7)
Mean Plasma Glucose: 128 mg/dL
eAG (mmol/L): 7.1 mmol/L

## 2021-12-27 LAB — TSH: TSH: 3.13 mIU/L (ref 0.40–4.50)

## 2022-01-02 ENCOUNTER — Ambulatory Visit (INDEPENDENT_AMBULATORY_CARE_PROVIDER_SITE_OTHER): Payer: Medicare Other | Admitting: Internal Medicine

## 2022-01-02 ENCOUNTER — Other Ambulatory Visit: Payer: Self-pay

## 2022-01-02 ENCOUNTER — Encounter: Payer: Self-pay | Admitting: Internal Medicine

## 2022-01-02 VITALS — BP 138/70 | HR 83 | Temp 98.0°F | Ht 65.0 in | Wt 166.8 lb

## 2022-01-02 DIAGNOSIS — E669 Obesity, unspecified: Secondary | ICD-10-CM

## 2022-01-02 DIAGNOSIS — Z96652 Presence of left artificial knee joint: Secondary | ICD-10-CM | POA: Diagnosis not present

## 2022-01-02 DIAGNOSIS — Z0000 Encounter for general adult medical examination without abnormal findings: Secondary | ICD-10-CM

## 2022-01-02 DIAGNOSIS — M19042 Primary osteoarthritis, left hand: Secondary | ICD-10-CM

## 2022-01-02 DIAGNOSIS — E1169 Type 2 diabetes mellitus with other specified complication: Secondary | ICD-10-CM

## 2022-01-02 DIAGNOSIS — G4700 Insomnia, unspecified: Secondary | ICD-10-CM

## 2022-01-02 DIAGNOSIS — I129 Hypertensive chronic kidney disease with stage 1 through stage 4 chronic kidney disease, or unspecified chronic kidney disease: Secondary | ICD-10-CM

## 2022-01-02 DIAGNOSIS — R7302 Impaired glucose tolerance (oral): Secondary | ICD-10-CM

## 2022-01-02 DIAGNOSIS — M19041 Primary osteoarthritis, right hand: Secondary | ICD-10-CM

## 2022-01-02 DIAGNOSIS — N182 Chronic kidney disease, stage 2 (mild): Secondary | ICD-10-CM

## 2022-01-02 DIAGNOSIS — I999 Unspecified disorder of circulatory system: Secondary | ICD-10-CM

## 2022-01-02 DIAGNOSIS — I10 Essential (primary) hypertension: Secondary | ICD-10-CM

## 2022-01-02 DIAGNOSIS — M7918 Myalgia, other site: Secondary | ICD-10-CM

## 2022-01-02 DIAGNOSIS — E039 Hypothyroidism, unspecified: Secondary | ICD-10-CM | POA: Diagnosis not present

## 2022-01-02 DIAGNOSIS — K5900 Constipation, unspecified: Secondary | ICD-10-CM

## 2022-01-02 DIAGNOSIS — N1831 Chronic kidney disease, stage 3a: Secondary | ICD-10-CM

## 2022-01-02 LAB — POCT URINALYSIS DIPSTICK
Bilirubin, UA: NEGATIVE
Blood, UA: NEGATIVE
Glucose, UA: NEGATIVE
Ketones, UA: NEGATIVE
Leukocytes, UA: NEGATIVE
Nitrite, UA: NEGATIVE
Protein, UA: NEGATIVE
Spec Grav, UA: 1.015 (ref 1.010–1.025)
Urobilinogen, UA: 0.2 E.U./dL
pH, UA: 5 (ref 5.0–8.0)

## 2022-01-02 MED ORDER — TRAMADOL HCL 50 MG PO TABS: ORAL_TABLET | ORAL | 0 refills | Status: DC

## 2022-01-02 MED ORDER — DICLOFENAC SODIUM 1 % EX GEL: CUTANEOUS | 11 refills | Status: DC

## 2022-01-02 MED ORDER — TRAMADOL HCL 50 MG PO TABS: 50.0000 mg | ORAL_TABLET | Freq: Three times a day (TID) | ORAL | 0 refills | Status: AC | PRN

## 2022-01-02 NOTE — Progress Notes (Deleted)
   Subjective:    Patient ID: Sharon Peters, female    DOB: 08-29-1940, 81 y.o.   MRN: [id number]  HPI  81 year old Female seen for Medicare Wellness and health maintenance exam.  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  History of chronic kidney disease followed by nephrology which is being observed.  History of constipation.  Did not like Linzess.  MiraLAX did not work well.  She takes tramadol for pain which may aggravate constipation.  Tramadol is for hand arthritis.  PMP aware checked and patient was given 5-day supply which she may take sparingly.  In August, she suffered a fall and was seen in the emergency department and diagnosed with dislocated left shoulder.  Apparently had vertigo which led to her falling.  She was getting up from a chair and felt unsteady and fell into a wall.  CT of the head without contrast was negative and CT of the C-spine was negative.  Was referred to orthopedist.  Has recovered.  History of left knee arthroplasty in May 2023.  History of type 2 diabetes mellitus treated with [place].  Essential hypertension treated with amlodipine.  Hypothyroidism treated with levothyroxine.  History of chronic kidney disease which is being observed.      Review of Systems had colonoscopy 2022 by Dr. Cristina Gong.Continue to have left knee pain and tibia pain. Has right elbow pain. Continues in therapy. Right knee pops occasionally but not painful.Has bursitis in left hip and is to have injection soon. Was told she had bursitis and bome spurs.     Objective:   Physical Exam        Assessment & Plan:

## 2022-01-02 NOTE — Patient Instructions (Addendum)
It has been a pleasure to see you today.  Tramadol has been refilled for 5 days.  Please take it sparingly for hand arthritis.  Hemoglobin A1c has increased from 5.7 to 6.1%.  Please try to watch diet and get a bit more exercise if possible with orthopedic issues.  Kidney functions are stable.  Please watch carbohydrates in diet.  Immunizations discussed.  We do not have the dates of some of these immunizations.  Please call pharmacy and get [place] those updated vaccine dates.  Return June 2024.

## 2022-01-02 NOTE — Progress Notes (Signed)
     Annual Wellness Visit     Patient: Sharon Peters, Female    DOB: [birth year], 81 y.o.   MRN: [id number] Visit Date: 01/02/2022  Chief Complaint  Patient presents with   Medicare Wellness   Annual Exam   Subjective    Sharon Peters is a 81 y.o. female who presents today for her Annual Wellness Visit.            Objective    Vitals: BP 138/70   Pulse 83   Temp 98 F (36.7 C) (Tympanic)   Ht '5\' 5"'$  (1.651 m)   Wt 166 lb 12.8 oz (75.7 kg)   SpO2 97%   BMI 27.76 kg/m      Most recent functional status assessment:    01/02/2022    9:59 AM  In your present state of health, do you have any difficulty performing the following activities:  Hearing? 0  Vision? 0  Difficulty concentrating or making decisions? 0  Walking or climbing stairs? 1  Dressing or bathing? 0  Doing errands, shopping? 0  Preparing Food and eating ? N  Using the Toilet? N  In the past six months, have you accidently leaked urine? N  Do you have problems with loss of bowel control? N  Managing your Medications? N  Managing your Finances? N  Housekeeping or managing your Housekeeping? N   Most recent fall risk assessment:    01/02/2022    9:57 AM  Fall Risk   Falls in the past year? 1  Number falls in past yr: 0  Injury with Fall? 1  Risk for fall due to : Impaired balance/gait  Follow up Falls prevention discussed    Most recent depression screenings:    01/02/2022    9:58 AM 05/09/2021   12:01 PM  PHQ 2/9 Scores  PHQ - 2 Score 0 0  PHQ- 9 Score  0   Most recent cognitive screening:    01/02/2022   10:01 AM  6CIT Screen  What Year? 0 points  What month? 0 points  What time? 3 points  Count back from 20 0 points  Months in reverse 0 points  Repeat phrase 0 points  Total Score 3 points           Annual wellness visit done today including the all of the following: Reviewed patient's Family Medical History Reviewed and updated list of patient's medical  providers Assessment of cognitive impairment was done Assessed patient's functional ability Established a written schedule for health screening [place] Completed and Reviewed  Discussed health benefits of physical activity, and encouraged her to engage in regular exercise appropriate for her age and condition.         IElby Showers, MD, have reviewed all documentation for this visit. The documentation on 02/14/22 for the exam, diagnosis, procedures, and orders are all accurate and complete.   LaVon Barron Alvine, CMA

## 2022-02-14 NOTE — Progress Notes (Signed)
Subjective:    Patient ID: Sharon Peters, female    DOB: 18-Aug-1940, 82 y.o.   MRN: [id number]  HPI  82 year old Female seen for Medicare Wellness and health maintenance exam.  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  History of chronic kidney disease followed by Nephrology which is being observed.  History of constipation.  Did not like Linzess.  MiraLAX did not work well.  She takes tramadol for pain which may aggravate constipation.  Tramadol is for hand arthritis.  Tramadol refilled for 30 days ( 90 tablets).   In August, she suffered a fall and was seen in the emergency department and diagnosed with dislocated left shoulder.  Apparently had vertigo which led to her falling.  She was getting up from a chair and felt unsteady and fell into a wall.  CT of the head without contrast was negative and CT of the C-spine was negative.  Was referred to orthopedist.  Has recovered.  History of left knee arthroplasty in May 2023.  History of type 2 diabetes mellitus treated with [place].  Essential hypertension treated with amlodipine.  Hypothyroidism treated with levothyroxine.  History of chronic kidney disease which is being observed.    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  History of chronic kidney disease which is stable.  Followed at [facility] and was seen there in October 2022.  Has been followed there since 2018.  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  Had colonoscopy in October 2022 at [facility] endoscopy center.  No repeat is necessary per gastroenterologist as she had no adenomatous colon polyps.  History of left ventricular diastolic dysfunction, hypertension, hyperlipidemia, hypothyroidism and metabolic syndrome.  Had left knee arthroscopic surgery of around 1997.  Tubal  ligation in 1979.  In 2012, was diagnosed with probable meniscal tear of the left knee injected with steroids.  Has been diagnosed in the past with trochanteric bursitis of the left hip.  Social history: Divorced and resides alone.  2 adult children, a son and a daughter.  She is retired from [employer].  Does not smoke.  Rare alcohol consumption.  Family history: Brother with history of liver cancer and history of hepatitis B infection.  Father died at age 72 due to pancreatic cancer.  Mother died at age 58 of lung cancer.  3 brothers and 1 sister.   Takes Ambien for longstanding history of insomnia 5 mg nightly.  Also gave her prescription for Voltaren gel.  She should use this sparingly.     Review of Systems had colonoscopy 2022 by Dr. Cristina Gong.Continues to have left knee pain and tibia pain. Has right elbow pain. Continues in therapy. Right knee pops occasionally but not painful.Has bursitis in left hip and is to have injection soon. Was told she had bursitis and bome spurs.     Objective:   Physical Exam Blood pressure 138/70 pulse 83 temperature 98 degrees pulse oximetry 97% weight 166 pounds 12.8 ounces height 5 feet 5 inches BMI 27.76  Skin: Warm and dry.  Nodes none.  No thyromegaly or carotid bruits.  Chest clear.  Cardiac exam: Regular rate and rhythm without ectopy.  Breast are without masses.  Abdomen is soft nondistended without hepatosplenomegaly masses or tenderness.  No significant pitting edema of the lower extremities.  Affect thought and judgment are normal.  Brief  neurological exam is normal without gross focal deficits.     Assessment & Plan:  Stage II chronic kidney disease-stable  Hypertension-stable on amlodipine  Impaired glucose tolerance-stable  Hypothyroidism stable on thyroid replacement medication  Insomnia treated with chronic Ambien therapy  History of constipation-colonoscopy does not need to be repeated  Musculoskeletal pain treated with  tramadol  History of traumatic dislocation of right shoulder secondary to a fall  Left knee arthroplasty May 2023  History of right knee arthroplasty  Plan: Continue current medications and follow-up in 6 months or as needed.

## 2022-04-19 ENCOUNTER — Other Ambulatory Visit: Payer: Self-pay | Admitting: Internal Medicine

## 2022-05-16 DIAGNOSIS — C44722 Squamous cell carcinoma of skin of right lower limb, including hip: Secondary | ICD-10-CM

## 2022-05-16 HISTORY — DX: Squamous cell carcinoma of skin of right lower limb, including hip: C44.722

## 2022-06-08 LAB — HM DIABETES EYE EXAM

## 2022-06-18 ENCOUNTER — Encounter: Payer: Self-pay | Admitting: Internal Medicine

## 2022-06-28 NOTE — Progress Notes (Signed)
Patient Care Team: Margaree Mackintosh, MD as PCP - General (Internal Medicine) [name], MD as PCP - Cardiology (Cardiology) [name], MD as Consulting Physician (Ophthalmology)  Visit Date: 07/05/22  Subjective:    Patient ID: Sharon Peters , Female   DOB: 09-08-1940, 82 y.o.    MRN: [id number]   82 y.o. Female presents today for a 6 month follow-up.  Recently diagnosed with psoriasis. Using topical cream.  History of Type 2 diabetes mellitus treated with metformin 500 mg daily with breakfast, Farxiga 5 mg daily. HGBA1c at 6.3% on 07/03/22, up from 6.1% on 12/26/21.  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  History of hypertension treated with amlodipine 10 mg daily.  Blood pressure elevated today at 148/80.  History of hypothyroidism treated with levothyroxine 75 mcg 30 minutes before breakfast on an empty stomach.  History of vertigo. Took meclizine in the past but stable without now.  History of insomnia treated with zolpidem 5 mg at bedtime.  History of musculoskeletal pain treated with tramadol 50 mg every 8 hours as needed.   On May 11, underwent left knee arthroplasty at surgery center on [address]. Has done PT. Still having some balance issues.  Glucose slightly elevated at 102. Kidney, liver functions normal. Electrolytes normal. Blood proteins normal. TSH at 1.74.  Past Medical History:  Diagnosis Date   Chest discomfort    Nuclear, December, 2008, normal   Diabetes mellitus type II    Ejection fraction    EF 65%, February, 2010, trivial pericardial effusion,   HTN (hypertension)    Hypothyroidism    Obesity    Pericardial effusion    Trivial, echo, February, 2011  /     small...int he past...unexplained...normal sedimentation rate and ANA   Psoriasis    Squamous cell carcinoma, leg, 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     Family History  Problem Relation Age of Onset   Cancer Mother    Cancer Father    Breast cancer Brother     Diabetes Other        2 siblings   Cancer Other     Social History   Social History Narrative   Not on file      Review of Systems  Constitutional:  Negative for fever and malaise/fatigue.  HENT:  Negative for congestion.   Eyes:  Negative for blurred vision.  Respiratory:  Negative for cough and shortness of breath.   Cardiovascular:  Negative for chest pain, palpitations and leg swelling.  Gastrointestinal:  Negative for vomiting.  Musculoskeletal:  Negative for back pain.  Skin:  Negative for rash.  Neurological:  Negative for loss of consciousness and headaches.        Objective:   Vitals: BP (!) 148/80   Pulse 68   Temp (!) 97.3 F (36.3 C) (Tympanic)   Ht 5\' 5"  (1.651 m)   Wt 168 lb (76.2 kg)   BMI 27.96 kg/m    Physical Exam Vitals and nursing note reviewed.  Constitutional:      General: She is not in acute distress.    Appearance: Normal appearance. She is not toxic-appearing.  HENT:     Head: Normocephalic and atraumatic.  Neck:     Thyroid: No thyroid mass, thyromegaly or thyroid tenderness.     Vascular: No carotid bruit.  Cardiovascular:     Rate and Rhythm: Normal rate and regular rhythm. No extrasystoles are present.    Pulses: Normal pulses.  Heart sounds: Normal heart sounds. No murmur heard.    No friction rub. No gallop.  Pulmonary:     Effort: Pulmonary effort is normal. No respiratory distress.     Breath sounds: Normal breath sounds. No wheezing or rales.  Musculoskeletal:     Right lower leg: No edema.     Left lower leg: No edema.  Lymphadenopathy:     Cervical: No cervical adenopathy.  Skin:    General: Skin is warm and dry.  Neurological:     Mental Status: She is alert and oriented to person, place, and time. Mental status is at baseline.  Psychiatric:        Mood and Affect: Mood normal.        Behavior: Behavior normal.        Thought Content: Thought content normal.        Judgment: Judgment normal.        Results:   Studies obtained and personally reviewed by me:   Labs:       Component Value Date/Time   NA 136 07/03/2022 0930   K 4.2 07/03/2022 0930   CL 102 07/03/2022 0930   CO2 29 07/03/2022 0930   GLUCOSE 102 (H) 07/03/2022 0930   BUN 18 07/03/2022 0930   CREATININE 0.95 07/03/2022 0930   CALCIUM 9.9 07/03/2022 0930   PROT 7.3 07/03/2022 0930   ALBUMIN 4.5 11/25/2015 1039   AST 18 07/03/2022 0930   ALT 12 07/03/2022 0930   ALKPHOS 51 11/25/2015 1039   BILITOT 0.9 07/03/2022 0930   GFRNONAA 52 (L) 06/28/2020 0908   GFRAA 60 06/28/2020 0908     Lab Results  Component Value Date   WBC 4.5 12/26/2021   HGB 14.0 12/26/2021   HCT 41.6 12/26/2021   MCV 86.7 12/26/2021   PLT 236 12/26/2021    Lab Results  Component Value Date   CHOL 190 12/26/2021   HDL 72 12/26/2021   LDLCALC 99 12/26/2021   TRIG 101 12/26/2021   CHOLHDL 2.6 12/26/2021    Lab Results  Component Value Date   HGBA1C 6.3 (H) 07/03/2022     Lab Results  Component Value Date   TSH 1.74 07/03/2022      Assessment & Plan:   Psoriasis: using topical cream.  Type 2 diabetes mellitus: treated with metformin 500 mg daily with breakfast, Farxiga 5 mg daily. HGBA1c at 6.3% on 07/03/22, up from 6.1% on 12/26/21.  Patient will watch diet.  Will try to be more physically active once house guests leave.  Hypertension: treated with amlodipine 10 mg daily. Blood pressure elevated today at 148/80.Has had house guests. She will continue to monitor BP and let us know if persistently elevated. Still seen at [facility]-last visit November 2023 and BP was 130/90 manually according to notes.  Per notes at [facility] visit in November 2023 she is no longer taking HCTZ  Hypothyroidism: treated with levothyroxine 75 mcg 30 minutes before breakfast on an empty stomach. TSH at 1.74.  Insomnia: stable with zolpidem 5 mg at bedtime.  Musculoskeletal pain: treated with tramadol 50 mg every 8  hours as needed.   Mild chronic kidney disease: kidney functions stable. Seen at [facility] in November 2023.  Diagnosed with stage II CKD.  Renal ultrasound has shown right kidney smaller than left possibly longstanding or AAS.  Knows to avoid NSAIDs and IV contrast material as well as PPIs.  Low risk of progression to end-stage renal disease per  Nephrology  Return in 6 months for health maintenance exam or as needed.    I,Alexander Ruley,acting as a Neurosurgeon for Margaree Mackintosh, MD.,have documented all relevant documentation on the behalf of Margaree Mackintosh, MD,as directed by  Margaree Mackintosh, MD while in the presence of Margaree Mackintosh, MD.   I, Margaree Mackintosh, MD, have reviewed all documentation for this visit. The documentation on 07/05/22 for the exam, diagnosis, procedures, and orders are all accurate and complete.

## 2022-07-03 ENCOUNTER — Other Ambulatory Visit: Payer: Medicare Other

## 2022-07-03 DIAGNOSIS — I10 Essential (primary) hypertension: Secondary | ICD-10-CM

## 2022-07-03 DIAGNOSIS — E039 Hypothyroidism, unspecified: Secondary | ICD-10-CM

## 2022-07-03 DIAGNOSIS — R7302 Impaired glucose tolerance (oral): Secondary | ICD-10-CM

## 2022-07-03 LAB — COMPLETE METABOLIC PANEL WITH GFR
Alkaline phosphatase (APISO): 74 U/L (ref 37–153)
Calcium: 9.9 mg/dL (ref 8.6–10.4)
Chloride: 102 mmol/L (ref 98–110)
Potassium: 4.2 mmol/L (ref 3.5–5.3)

## 2022-07-04 LAB — COMPLETE METABOLIC PANEL WITH GFR
AG Ratio: 1.7 (calc) (ref 1.0–2.5)
ALT: 12 U/L (ref 6–29)
AST: 18 U/L (ref 10–35)
Albumin: 4.6 g/dL (ref 3.6–5.1)
BUN: 18 mg/dL (ref 7–25)
CO2: 29 mmol/L (ref 20–32)
Creat: 0.95 mg/dL (ref 0.60–0.95)
Globulin: 2.7 g/dL (calc) (ref 1.9–3.7)
Glucose, Bld: 102 mg/dL — ABNORMAL HIGH (ref 65–99)
Sodium: 136 mmol/L (ref 135–146)
Total Bilirubin: 0.9 mg/dL (ref 0.2–1.2)
Total Protein: 7.3 g/dL (ref 6.1–8.1)
eGFR: 61 mL/min/{1.73_m2} (ref >=60)

## 2022-07-04 LAB — HEMOGLOBIN A1C
Hgb A1c MFr Bld: 6.3 % of total Hgb — ABNORMAL HIGH (ref <5.7)
Mean Plasma Glucose: 134 mg/dL
eAG (mmol/L): 7.4 mmol/L

## 2022-07-04 LAB — TSH: TSH: 1.74 mIU/L (ref 0.40–4.50)

## 2022-07-05 ENCOUNTER — Ambulatory Visit (INDEPENDENT_AMBULATORY_CARE_PROVIDER_SITE_OTHER): Payer: Medicare Other | Admitting: Internal Medicine

## 2022-07-05 ENCOUNTER — Encounter: Payer: Self-pay | Admitting: Internal Medicine

## 2022-07-05 VITALS — BP 148/80 | HR 68 | Temp 97.3°F | Ht 65.0 in | Wt 168.0 lb

## 2022-07-05 DIAGNOSIS — M19041 Primary osteoarthritis, right hand: Secondary | ICD-10-CM

## 2022-07-05 DIAGNOSIS — M7918 Myalgia, other site: Secondary | ICD-10-CM | POA: Diagnosis not present

## 2022-07-05 DIAGNOSIS — I10 Essential (primary) hypertension: Secondary | ICD-10-CM

## 2022-07-05 DIAGNOSIS — E7439 Other disorders of intestinal carbohydrate absorption: Secondary | ICD-10-CM

## 2022-07-05 DIAGNOSIS — E039 Hypothyroidism, unspecified: Secondary | ICD-10-CM | POA: Diagnosis not present

## 2022-07-05 DIAGNOSIS — M19042 Primary osteoarthritis, left hand: Secondary | ICD-10-CM

## 2022-07-05 DIAGNOSIS — Z96652 Presence of left artificial knee joint: Secondary | ICD-10-CM

## 2022-07-05 DIAGNOSIS — G4700 Insomnia, unspecified: Secondary | ICD-10-CM

## 2022-07-05 NOTE — Patient Instructions (Addendum)
Hemoglobin A1c is 6.3% up from 6.1% in December 2023.  Patient will try to be more physically active once her house guests leave.  Continue with Marcelline Deist and metformin.  Your blood pressure is stable on amlodipine.  It may be intermittently elevated with stress. She will continue to monitor her blood pressure.  No longer taking HCTZ according to notes  from [facility] visit in November 2023.  TSH is stable at 1.74 and we will continue with levothyroxine 75 mcg daily.  Continue Ambien at bedtime.  Has no side effects.  Has Tramadol if needed for musculoskeletal pain.  Return in 6 months for Medicare wellness visit and health maintenance exam as well as fasting labs.

## 2022-07-09 ENCOUNTER — Other Ambulatory Visit: Payer: Self-pay | Admitting: Internal Medicine

## 2022-07-09 DIAGNOSIS — M19041 Primary osteoarthritis, right hand: Secondary | ICD-10-CM

## 2022-10-16 ENCOUNTER — Other Ambulatory Visit: Payer: Self-pay | Admitting: Internal Medicine

## 2022-11-13 ENCOUNTER — Other Ambulatory Visit: Payer: Self-pay | Admitting: Internal Medicine

## 2022-11-13 MED ORDER — DICLOFENAC SODIUM 1 % EX GEL: CUTANEOUS | 11 refills | Status: AC

## 2022-11-13 NOTE — Telephone Encounter (Signed)
Shondel Schaak [phone]  Steward Drone called to see if she could get a prescription sent in for below medication, her insurance does not cover any more, she is going to pay out of pocket and she would like to get 2 at a time to use a month instead of 1.  diclofenac Sodium (VOLTAREN) 1 % GEL   [facility] [address] [place], [place] - [address] AT [address] Phone: [phone]  Fax: [phone]

## 2022-11-30 LAB — LAB REPORT - SCANNED
Albumin, Urine POC: 6.2
Creatinine, POC: 37.5 mg/dL
Microalb Creat Ratio: 17

## 2022-12-12 ENCOUNTER — Encounter: Payer: Self-pay | Admitting: Nephrology

## 2022-12-31 NOTE — Progress Notes (Shared)
Annual Wellness Visit    Patient Care Team: Baxley, Luanna Cole, MD as PCP - General (Internal Medicine) [name], MD as PCP - Cardiology (Cardiology) [name], MD as Consulting Physician (Ophthalmology)  Visit Date: 01/07/23   Chief Complaint  Patient presents with   Medicare Wellness   Annual Exam    Subjective:   Patient: Sharon Peters, Female    DOB: [birth year], 82 y.o.   MRN: [id number]  Sharon Peters is a 82 y.o. Female who presents today for her Annual Wellness Visit. 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  History of Type 2 diabetes mellitus treated with Farxiga 5 mg daily, metformin 500 mg daily. HGBA1c at 6.2% on 01/03/23, down from 6.3% on 07/03/22.  History of hypertension treated with amlodipine 10 mg daily. Blood pressure elevated today at 160/82.  History of hypothyroidism treated with levothyroxine 75 mcg daily. TSH at 2.88.  History of insomnia treated with zolpidem 5 mg at bedtime as needed.  History of constipation.  Did not like Linzess.  MiraLAX did not work well.   In August 2023, she suffered a fall and was seen in the emergency department and diagnosed with dislocated left shoulder.  Apparently had vertigo which led to her falling.  She was getting up from a chair and felt unsteady and fell into a wall.  CT of the head without contrast was negative and CT of the C-spine was negative.  Was referred to orthopedist.  Denies further falls. She has completed PT balance training but is still having issues with this. Reports occasional dizziness when standing up from bent position.   History of left knee arthroplasty in May 2023.    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   History of chronic kidney disease which is stable.  Followed at [facility] and was seen there in October 2022.  Has been followed there since 2018.  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 Creatinine elevated at 0.96. GFR low at 59. Creatinine/microalbumin normal.   History of left ventricular diastolic dysfunction.   Had left knee arthroscopic surgery around 1997.  Tubal ligation in 1979.  In 2012, was diagnosed with probable meniscal tear of the left knee injected with steroids.  Has been diagnosed in the past with trochanteric bursitis of the left hip.  01/03/23 labs reviewed today. Glucose normal. Liver functions normal. Electrolytes normal. Blood proteins normal. MCHC low at 31.7. Lipid panel normal.   No recent mammogram.  Had colonoscopy in October 2022 at [facility].  No repeat is necessary per gastroenterologist as she had no adenomatous colon polyps.  Social history: Divorced and resides alone.  2 adult children, a son and a daughter.  She is retired from [employer].  Does not smoke.  Rare alcohol consumption.   Family history: Brother with history of liver cancer and history of hepatitis B infection.  Father died at age 81 due to pancreatic cancer.  Mother died at age 27 of lung cancer.  3 brothers and 1 sister.  Past Medical History:  Diagnosis Date   Chest discomfort    Nuclear, December, 2008, normal   Diabetes mellitus type II    Ejection fraction    EF 65%, February, 2010, trivial pericardial effusion,   HTN (hypertension)    Hypothyroidism    Obesity  Pericardial effusion    Trivial, echo, February, 2011  /     small...int he past...unexplained...normal sedimentation rate and ANA   Psoriasis    Squamous cell carcinoma, leg, 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     Family History  Problem Relation Age of Onset   Cancer Mother    Cancer Father    Breast cancer Brother    Diabetes Other        2 siblings   Cancer Other      Social History   Social History Narrative   Not on file     Review of Systems   Constitutional:  Negative for chills, fever, malaise/fatigue and weight loss.  HENT:  Negative for hearing loss, sinus pain and sore throat.   Respiratory:  Negative for cough, hemoptysis and shortness of breath.   Cardiovascular:  Negative for chest pain, palpitations, leg swelling and PND.  Gastrointestinal:  Negative for abdominal pain, constipation, diarrhea, heartburn, nausea and vomiting.  Genitourinary:  Negative for dysuria, frequency and urgency.  Musculoskeletal:  Negative for back pain, myalgias and neck pain.  Skin:  Negative for itching and rash.  Neurological:  Positive for dizziness (Vertigo). Negative for tingling, seizures and headaches.  Endo/Heme/Allergies:  Negative for polydipsia.  Psychiatric/Behavioral:  Negative for depression. The patient is not nervous/anxious.       Objective:   Vitals: BP (!) 160/82   Ht 5\' 5"  (1.651 m)   Wt 168 lb (76.2 kg)   BMI 27.96 kg/m   Physical Exam Vitals and nursing note reviewed.  Constitutional:      General: She is not in acute distress.    Appearance: Normal appearance. She is not ill-appearing or toxic-appearing.  HENT:     Head: Normocephalic and atraumatic.     Right Ear: Hearing, tympanic membrane, ear canal and external ear normal.     Left Ear: Hearing, tympanic membrane, ear canal and external ear normal.     Mouth/Throat:     Pharynx: Oropharynx is clear.  Eyes:     Extraocular Movements: Extraocular movements intact.     Pupils: Pupils are equal, round, and reactive to light.  Neck:     Thyroid: No thyroid mass, thyromegaly or thyroid tenderness.     Vascular: No carotid bruit.  Cardiovascular:     Rate and Rhythm: Normal rate and regular rhythm. No extrasystoles are present.    Pulses:          Dorsalis pedis pulses are 1+ on the right side and 1+ on the left side.     Heart sounds: Normal heart sounds. No murmur heard.    No friction rub. No gallop.  Pulmonary:     Effort: Pulmonary effort is normal.      Breath sounds: Normal breath sounds. No decreased breath sounds, wheezing, rhonchi or rales.  Chest:     Chest wall: No mass.  Breasts:    Right: No mass.     Left: No mass.  Abdominal:     Palpations: Abdomen is soft. There is no hepatomegaly, splenomegaly or mass.     Tenderness: There is no abdominal tenderness.     Hernia: No hernia is present.  Musculoskeletal:     Cervical back: Normal range of motion.     Right lower leg: No edema.     Left lower leg: No edema.  Lymphadenopathy:     Cervical: No cervical adenopathy.     Upper Body:     Right upper body: No supraclavicular  adenopathy.     Left upper body: No supraclavicular adenopathy.  Skin:    General: Skin is warm and dry.  Neurological:     General: No focal deficit present.     Mental Status: She is alert and oriented to person, place, and time. Mental status is at baseline.     Sensory: Sensation is intact.     Motor: Motor function is intact. No weakness.     Deep Tendon Reflexes: Reflexes are normal and symmetric.  Psychiatric:        Attention and Perception: Attention normal.        Mood and Affect: Mood normal.        Speech: Speech normal.        Behavior: Behavior normal.        Thought Content: Thought content normal.        Cognition and Memory: Cognition normal.        Judgment: Judgment normal.      Most recent functional status assessment:    01/07/2023   11:02 AM  In your present state of health, do you have any difficulty performing the following activities:  Hearing? 0  Vision? 0  Difficulty concentrating or making decisions? 0  Walking or climbing stairs? 0  Dressing or bathing? 0  Doing errands, shopping? 0  Preparing Food and eating ? N  Using the Toilet? N  In the past six months, have you accidently leaked urine? N  Do you have problems with loss of bowel control? N  Managing your Medications? N  Managing your Finances? N  Housekeeping or managing your Housekeeping? N    Most recent fall risk assessment:    01/07/2023   11:03 AM  Fall Risk   Falls in the past year? 0  Number falls in past yr: 0  Injury with Fall? 0  Risk for fall due to : No Fall Risks  Follow up Falls evaluation completed;Education provided;Falls prevention discussed    Most recent depression screenings:    01/07/2023   11:03 AM 01/02/2022    9:58 AM  PHQ 2/9 Scores  PHQ - 2 Score 0 0   Most recent cognitive screening:    01/07/2023   11:07 AM  6CIT Screen  What Year? 0 points  What month? 0 points  What time? 0 points  Count back from 20 0 points  Months in reverse 0 points  Repeat phrase 0 points  Total Score 0 points     Results:   Studies obtained and personally reviewed by me:  Had colonoscopy in October 2022 at [facility].  No repeat is necessary per gastroenterologist as she had no adenomatous colon polyps.  Labs:       Component Value Date/Time   NA 137 01/03/2023 0909   K 4.3 01/03/2023 0909   CL 103 01/03/2023 0909   CO2 25 01/03/2023 0909   GLUCOSE 98 01/03/2023 0909   BUN 20 01/03/2023 0909   CREATININE 0.96 (H) 01/03/2023 0909   CALCIUM 9.6 01/03/2023 0909   PROT 7.1 01/03/2023 0909   ALBUMIN 4.5 11/25/2015 1039   AST 18 01/03/2023 0909   ALT 15 01/03/2023 0909   ALKPHOS 51 11/25/2015 1039   BILITOT 0.9 01/03/2023 0909   GFRNONAA 52 (L) 06/28/2020 0908   GFRAA 60 06/28/2020 0908     Lab Results  Component Value Date   WBC 4.5 01/03/2023   HGB 13.4 01/03/2023   HCT 42.3 01/03/2023   MCV  89.1 01/03/2023   PLT 220 01/03/2023    Lab Results  Component Value Date   CHOL 187 01/03/2023   HDL 68 01/03/2023   LDLCALC 99 01/03/2023   TRIG 105 01/03/2023   CHOLHDL 2.8 01/03/2023    Lab Results  Component Value Date   HGBA1C 6.2 (H) 01/03/2023     Lab Results  Component Value Date   TSH 2.88 01/03/2023    Assessment & Plan:   Benign paroxysmal positional vertigo: given handwritten order for PT.  Type 2  diabetes mellitus: treated with Farxiga 5 mg daily, metformin 500 mg daily. HGBA1c at 6.2% on 01/03/23, down from 6.3% on 07/03/22.  Hypertension: treated with amlodipine 10 mg daily. Blood pressure elevated today at 160/82.  Hypothyroidism: treated with levothyroxine 75 mcg daily. TSH at 2.88.  Insomnia: treated with zolpidem 5 mg at bedtime as needed.  Chronic kidney disease: this is stable.  Followed at [facility] and was seen there in October 2022. Creatinine elevated at 0.96. GFR low at 59. Creatinine/microalbumin normal.  No recent mammogram.  Had colonoscopy in October 2022 at [facility] endoscopy center.  No repeat is necessary per gastroenterologist as she had no adenomatous colon polyps.  Vaccine counseling: UTD on flu, tetanus vaccines.  Return in 6 months or as needed.     Annual wellness visit done today including the all of the following: Reviewed patient's Family Medical History Reviewed and updated list of patient's medical providers Assessment of cognitive impairment was done Assessed patient's functional ability Established a written schedule for health screening services Health Risk Assessent Completed and Reviewed  Discussed health benefits of physical activity, and encouraged her to engage in regular exercise appropriate for her age and condition.        I,Alexander Ruley,acting as a Neurosurgeon for Margaree Mackintosh, MD.,have documented all relevant documentation on the behalf of Margaree Mackintosh, MD,as directed by  Margaree Mackintosh, MD while in the presence of Margaree Mackintosh, MD.   ***

## 2023-01-03 ENCOUNTER — Other Ambulatory Visit: Payer: Medicare Other

## 2023-01-03 DIAGNOSIS — Z0000 Encounter for general adult medical examination without abnormal findings: Secondary | ICD-10-CM

## 2023-01-03 DIAGNOSIS — R7302 Impaired glucose tolerance (oral): Secondary | ICD-10-CM

## 2023-01-03 DIAGNOSIS — M19042 Primary osteoarthritis, left hand: Secondary | ICD-10-CM

## 2023-01-03 DIAGNOSIS — I10 Essential (primary) hypertension: Secondary | ICD-10-CM

## 2023-01-03 DIAGNOSIS — E039 Hypothyroidism, unspecified: Secondary | ICD-10-CM

## 2023-01-03 DIAGNOSIS — N182 Chronic kidney disease, stage 2 (mild): Secondary | ICD-10-CM

## 2023-01-04 LAB — COMPLETE METABOLIC PANEL WITH GFR
AG Ratio: 2 (calc) (ref 1.0–2.5)
ALT: 15 U/L (ref 6–29)
AST: 18 U/L (ref 10–35)
Albumin: 4.7 g/dL (ref 3.6–5.1)
Alkaline phosphatase (APISO): 69 U/L (ref 37–153)
BUN/Creatinine Ratio: 21 (calc) (ref 6–22)
BUN: 20 mg/dL (ref 7–25)
CO2: 25 mmol/L (ref 20–32)
Calcium: 9.6 mg/dL (ref 8.6–10.4)
Chloride: 103 mmol/L (ref 98–110)
Creat: 0.96 mg/dL — ABNORMAL HIGH (ref 0.60–0.95)
Globulin: 2.4 g/dL (ref 1.9–3.7)
Glucose, Bld: 98 mg/dL (ref 65–99)
Potassium: 4.3 mmol/L (ref 3.5–5.3)
Sodium: 137 mmol/L (ref 135–146)
Total Bilirubin: 0.9 mg/dL (ref 0.2–1.2)
Total Protein: 7.1 g/dL (ref 6.1–8.1)
eGFR: 59 mL/min/{1.73_m2} — ABNORMAL LOW (ref >=60)

## 2023-01-04 LAB — MICROALBUMIN / CREATININE URINE RATIO
Creatinine, Urine: 105 mg/dL (ref 20–275)
Microalb Creat Ratio: 10 mg/g{creat} (ref <30)
Microalb, Ur: 1 mg/dL

## 2023-01-04 LAB — LIPID PANEL
Cholesterol: 187 mg/dL (ref <200)
HDL: 68 mg/dL (ref >=50)
LDL Cholesterol (Calc): 99 mg/dL
Non-HDL Cholesterol (Calc): 119 mg/dL (ref <130)
Total CHOL/HDL Ratio: 2.8 (calc) (ref <5.0)
Triglycerides: 105 mg/dL (ref <150)

## 2023-01-04 LAB — CBC WITH DIFFERENTIAL/PLATELET
Absolute Lymphocytes: 1328 {cells}/uL (ref 850–3900)
Absolute Monocytes: 594 {cells}/uL (ref 200–950)
Basophils Absolute: 32 {cells}/uL (ref 0–200)
Basophils Relative: 0.7 %
Eosinophils Absolute: 81 {cells}/uL (ref 15–500)
Eosinophils Relative: 1.8 %
HCT: 42.3 % (ref 35.0–45.0)
Hemoglobin: 13.4 g/dL (ref 11.7–15.5)
MCH: 28.2 pg (ref 27.0–33.0)
MCHC: 31.7 g/dL — ABNORMAL LOW (ref 32.0–36.0)
MCV: 89.1 fL (ref 80.0–100.0)
MPV: 11.3 fL (ref 7.5–12.5)
Monocytes Relative: 13.2 %
Neutro Abs: 2466 {cells}/uL (ref 1500–7800)
Neutrophils Relative %: 54.8 %
Platelets: 220 10*3/uL (ref 140–400)
RBC: 4.75 10*6/uL (ref 3.80–5.10)
RDW: 12.8 % (ref 11.0–15.0)
Total Lymphocyte: 29.5 %
WBC: 4.5 10*3/uL (ref 3.8–10.8)

## 2023-01-04 LAB — HEMOGLOBIN A1C
Hgb A1c MFr Bld: 6.2 %{Hb} — ABNORMAL HIGH (ref <5.7)
Mean Plasma Glucose: 131 mg/dL
eAG (mmol/L): 7.3 mmol/L

## 2023-01-04 LAB — TSH: TSH: 2.88 m[IU]/L (ref 0.40–4.50)

## 2023-01-07 ENCOUNTER — Ambulatory Visit: Payer: Medicare Other | Admitting: Internal Medicine

## 2023-01-07 ENCOUNTER — Encounter: Payer: Self-pay | Admitting: Internal Medicine

## 2023-01-07 VITALS — BP 160/82 | Ht 65.0 in | Wt 168.0 lb

## 2023-01-07 DIAGNOSIS — M19041 Primary osteoarthritis, right hand: Secondary | ICD-10-CM

## 2023-01-07 DIAGNOSIS — E119 Type 2 diabetes mellitus without complications: Secondary | ICD-10-CM

## 2023-01-07 DIAGNOSIS — N182 Chronic kidney disease, stage 2 (mild): Secondary | ICD-10-CM

## 2023-01-07 DIAGNOSIS — M7918 Myalgia, other site: Secondary | ICD-10-CM | POA: Diagnosis not present

## 2023-01-07 DIAGNOSIS — Z0000 Encounter for general adult medical examination without abnormal findings: Secondary | ICD-10-CM | POA: Diagnosis not present

## 2023-01-07 DIAGNOSIS — I129 Hypertensive chronic kidney disease with stage 1 through stage 4 chronic kidney disease, or unspecified chronic kidney disease: Secondary | ICD-10-CM

## 2023-01-07 DIAGNOSIS — I10 Essential (primary) hypertension: Secondary | ICD-10-CM

## 2023-01-07 DIAGNOSIS — Z96652 Presence of left artificial knee joint: Secondary | ICD-10-CM

## 2023-01-07 DIAGNOSIS — H8110 Benign paroxysmal vertigo, unspecified ear: Secondary | ICD-10-CM

## 2023-01-07 DIAGNOSIS — M19042 Primary osteoarthritis, left hand: Secondary | ICD-10-CM

## 2023-01-07 DIAGNOSIS — G4700 Insomnia, unspecified: Secondary | ICD-10-CM

## 2023-01-07 DIAGNOSIS — E039 Hypothyroidism, unspecified: Secondary | ICD-10-CM

## 2023-01-07 LAB — POCT URINALYSIS DIP (CLINITEK)
Bilirubin, UA: NEGATIVE
Blood, UA: NEGATIVE
Glucose, UA: NEGATIVE mg/dL
Ketones, POC UA: NEGATIVE mg/dL
Leukocytes, UA: NEGATIVE
Nitrite, UA: NEGATIVE
POC PROTEIN,UA: NEGATIVE
Spec Grav, UA: 1.015 (ref 1.010–1.025)
Urobilinogen, UA: 0.2 U/dL
pH, UA: 6 (ref 5.0–8.0)

## 2023-01-15 ENCOUNTER — Encounter: Payer: Self-pay | Admitting: Internal Medicine

## 2023-01-15 NOTE — Patient Instructions (Addendum)
Your labs are stable.  Order written for physical therapy for vertigo/Epley maneuver.  Continue current medications.  Return in 6 months or as needed.  It was a pleasure to see you today.

## 2023-01-22 ENCOUNTER — Ambulatory Visit: Payer: Self-pay | Admitting: Internal Medicine

## 2023-01-22 ENCOUNTER — Encounter: Payer: Self-pay | Admitting: Internal Medicine

## 2023-01-22 ENCOUNTER — Telehealth: Payer: Medicare Other | Admitting: Internal Medicine

## 2023-01-22 VITALS — Ht 65.0 in | Wt 168.0 lb

## 2023-01-22 DIAGNOSIS — M19042 Primary osteoarthritis, left hand: Secondary | ICD-10-CM | POA: Diagnosis not present

## 2023-01-22 DIAGNOSIS — M19041 Primary osteoarthritis, right hand: Secondary | ICD-10-CM

## 2023-01-22 DIAGNOSIS — M7918 Myalgia, other site: Secondary | ICD-10-CM

## 2023-01-22 MED ORDER — TRAMADOL HCL 50 MG PO TABS: 50.0000 mg | ORAL_TABLET | Freq: Three times a day (TID) | ORAL | 0 refills | Status: AC | PRN

## 2023-01-22 NOTE — Progress Notes (Addendum)
 Patient Care Team: Perri Ronal PARAS, MD as PCP - General (Internal Medicine) Alvan Ronal BRAVO, MD as PCP - Cardiology (Cardiology) Leslee Reusing, MD as Consulting Physician (Ophthalmology)  I connected with [name] on 01/22/23 at 4:41 PM by video enabled telemedicine visit and verified that I am speaking with the correct person using two identifiers, myself and Sharon Peters, CMA. I am in my office and patient is in their home.   I discussed the limitations, risks, security and privacy concerns of performing an evaluation and management service by telemedicine and the availability of in-person appointments. I also discussed with the patient that there may be a patient responsible charge related to this service. The patient expressed understanding and agreed to proceed.   Other persons participating in the visit and their role in the encounter: Medical scribe, Damien Blanks  Patient's location: Home  Provider's location: Clinic   I provided 10 minutes of face-to-face video visit time during this encounter, and > 50% was spent counseling as documented under my assessment & plan.   Chief Complaint: Medication Refill (Tramadol )  Subjective:  Patient PI:Sharon Peters,Female DOB:17-Feb-1940,82 y.o. FMW:[id number]   83 y.o. Female presents today for follow up for Arthritis in in her hands bilaterally. Currently managed with Voltaren  topical gel but has previously also used 50 mg Tramadol , which she notes is more effective at relieving her pain. Was taking Tramadol  once a day. Is agreeable for PDMP.     Past Medical History:  Diagnosis Date   Chest discomfort    Nuclear, December, 2008, normal   Diabetes mellitus type II    Ejection fraction    EF 65%, February, 2010, trivial pericardial effusion,   HTN (hypertension)    Hypothyroidism    Obesity    Pericardial effusion    Trivial, echo, February, 2011  /     small...int he past...unexplained...normal sedimentation rate and  ANA   Psoriasis    Squamous cell carcinoma, leg, 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    Family History  Problem Relation Age of Onset   Cancer Mother    Cancer Father    Breast cancer Brother    Diabetes Other        2 siblings   Cancer Other    Social History   Social History Narrative   Not on file   Review of Systems  Constitutional:  Negative for fever and malaise/fatigue.  HENT:  Negative for congestion.   Eyes:  Negative for blurred vision.  Respiratory:  Negative for cough and shortness of breath.   Cardiovascular:  Negative for chest pain, palpitations and leg swelling.  Gastrointestinal:  Negative for vomiting.  Musculoskeletal:  Positive for joint pain (hands bilaterally, arthritis). Negative for back pain.  Skin:  Negative for rash.  Neurological:  Negative for loss of consciousness and headaches.    Objective:  Vitals: Ht 5' 5 (1.651 m)   Wt 168 lb (76.2 kg)   BMI 27.96 kg/m   Physical Exam Vitals and nursing note reviewed.  Constitutional:      General: She is not in acute distress.    Appearance: Normal appearance. She is not toxic-appearing.  HENT:     Head: Normocephalic and atraumatic.  Pulmonary:     Effort: Pulmonary effort is normal.  Skin:    General: Skin is warm and dry.  Neurological:     Mental Status: She is alert and oriented to person, place, and time. Mental status is at baseline.  Psychiatric:  Mood and Affect: Mood normal.        Behavior: Behavior normal.        Thought Content: Thought content normal.        Judgment: Judgment normal.     Results:  Studies Obtained And Personally Reviewed By Me: Labs:      Component Value Date/Time   NA 137 01/03/2023 0909   K 4.3 01/03/2023 0909   CL 103 01/03/2023 0909   CO2 25 01/03/2023 0909   GLUCOSE 98 01/03/2023 0909   BUN 20 01/03/2023 0909   CREATININE 0.96 (H) 01/03/2023 0909   CALCIUM 9.6 01/03/2023 0909   PROT 7.1 01/03/2023 0909   ALBUMIN 4.5 11/25/2015 1039   AST 18 01/03/2023 0909    ALT 15 01/03/2023 0909   ALKPHOS 51 11/25/2015 1039   BILITOT 0.9 01/03/2023 0909   GFRNONAA 52 (L) 06/28/2020 0908   GFRAA 60 06/28/2020 0908    Lab Results  Component Value Date   WBC 4.5 01/03/2023   HGB 13.4 01/03/2023   HCT 42.3 01/03/2023   MCV 89.1 01/03/2023   PLT 220 01/03/2023   Lab Results  Component Value Date   CHOL 187 01/03/2023   HDL 68 01/03/2023   LDLCALC 99 01/03/2023   TRIG 105 01/03/2023   CHOLHDL 2.8 01/03/2023   Lab Results  Component Value Date   HGBA1C 6.2 (H) 01/03/2023    Lab Results  Component Value Date   TSH 2.88 01/03/2023    Assessment & Plan:   Arthritis: hands bilaterally. Currently managed with Voltaren  topical gel but has previously also used 50 mg Tramadol , which she notes is more effective at relieving her pain. Was taking Tramadol  once a day. Is agreeable for PDMP. Sending in 50 mg Tramadol  for 5 days - take every 8 hours as needed for pain.   Return February 05 2023 for Urine Drug Screening and OV. If patient is going to be on Tramadol  long term for musculoskeletal pain, she will need to sign a pain management contract and be seen q 3 months. She is aware of this.  I,Emily Lagle,acting as a neurosurgeon for Ronal JINNY Hailstone, MD.,have documented all relevant documentation on the behalf of Ronal JINNY Hailstone, MD,as directed by  Ronal JINNY Hailstone, MD while in the presence of Ronal JINNY Hailstone, MD.   I, Ronal JINNY Hailstone, MD, have reviewed all documentation for this visit. The documentation on 01/22/23 for the exam, diagnosis, procedures, and orders are all accurate and complete.  Time spent with chart review, medical decision making, checking PMP aware and e-scribing medication is 20 minutes.

## 2023-01-22 NOTE — Telephone Encounter (Signed)
 Do you want me to call and schedule an office visit?

## 2023-01-22 NOTE — Telephone Encounter (Signed)
 Pt called to gain more info. Pt states she forgot to ask for this Tramadol  medication at her 12/24 wellness check. Pt states the Voltaren  helps some, but not as well as Tramadol  and would like to start it back. Pt was on it until a year ago and provider stopped it. Pt unsure why.       Copied from CRM [id number]. Topic: Clinical - Medication Refill >> Jan 22, 2023 11:19 AM Farrel B wrote: Most Recent Primary Care Visit:  Provider: [facility] RONAL PARAS  Department: [facility]  Visit Type: MEDICARE WELL VISIT 45  Date: 01/07/2023  Medication: Tramadol    Has the patient contacted their pharmacy? No (Agent: If no, request that the patient contact the pharmacy for the refill. If patient does not wish to contact the pharmacy document the reason why and proceed with request.) (Agent: If yes, when and what did the pharmacy advise?)  Is this the correct pharmacy for this prescription? Yes If no, delete pharmacy and type the correct one.  This is the patient's preferred pharmacy:   [id number] [place], [place] - [address] AT [facility] [address] [facility] [address] [place] [address] Phone: [phone] Fax: [phone] Has the prescription been filled recently? No  Is the patient out of the medication? No was taking Voltren for the pain  Has the patient been seen for an appointment in the last year OR does the patient have an upcoming appointment? Yes  Can we respond through MyChart? No  Agent: Please be advised that Rx refills may take up to 3 business days. We ask that you follow-up with your pharmacy. Answer Assessment - Initial Assessment Questions 1. DRUG NAME: What medicine do you need to have refilled?     Tramadol  2. REFILLS REMAINING: How many refills are remaining? (Note: The label on the medicine or pill bottle will show how many refills are remaining. If there are no refills remaining, then a renewal may be needed.)     na 3.  EXPIRATION DATE: What is the expiration date? (Note: The label states when the prescription will expire, and thus can no longer be refilled.)     na 4. PRESCRIBING HCP: Who prescribed it? Reason: If prescribed by specialist, call should be referred to that group.     [place] 5. SYMPTOMS: Do you have any symptoms?     Keeps pain down in hands  Protocols used: Medication Refill and Renewal Call-A-AH

## 2023-01-22 NOTE — Patient Instructions (Signed)
 Tramadol 50 mg (#15) tabs prescribed. If she is going to need long term Tramadol for osteoarthritis, she will need a pain management contract and routine drug screening every 3 months.

## 2023-01-22 NOTE — Telephone Encounter (Signed)
 LVM to CB and schedule an appointment video visit or in office to discuss going on this medication.

## 2023-02-05 ENCOUNTER — Encounter: Payer: Self-pay | Admitting: Internal Medicine

## 2023-02-05 ENCOUNTER — Ambulatory Visit (INDEPENDENT_AMBULATORY_CARE_PROVIDER_SITE_OTHER): Payer: Medicare Other | Admitting: Internal Medicine

## 2023-02-05 ENCOUNTER — Other Ambulatory Visit: Payer: Self-pay

## 2023-02-05 VITALS — BP 160/80 | HR 75 | Ht 65.0 in | Wt 168.0 lb

## 2023-02-05 DIAGNOSIS — M79642 Pain in left hand: Secondary | ICD-10-CM

## 2023-02-05 DIAGNOSIS — M19041 Primary osteoarthritis, right hand: Secondary | ICD-10-CM

## 2023-02-05 DIAGNOSIS — N182 Chronic kidney disease, stage 2 (mild): Secondary | ICD-10-CM | POA: Diagnosis not present

## 2023-02-05 DIAGNOSIS — I10 Essential (primary) hypertension: Secondary | ICD-10-CM | POA: Diagnosis not present

## 2023-02-05 DIAGNOSIS — M19042 Primary osteoarthritis, left hand: Secondary | ICD-10-CM

## 2023-02-05 DIAGNOSIS — M7918 Myalgia, other site: Secondary | ICD-10-CM | POA: Diagnosis not present

## 2023-02-05 DIAGNOSIS — G8929 Other chronic pain: Secondary | ICD-10-CM

## 2023-02-05 DIAGNOSIS — M79641 Pain in right hand: Secondary | ICD-10-CM

## 2023-02-05 MED ORDER — TRAMADOL HCL 50 MG PO TABS: 50.0000 mg | ORAL_TABLET | Freq: Two times a day (BID) | ORAL | 0 refills | Status: AC

## 2023-02-05 NOTE — Progress Notes (Addendum)
Patient Care Team: Sharon Mackintosh, MD as PCP - General (Internal Medicine) [name], MD as PCP - Cardiology (Cardiology) [name], MD as Consulting Physician (Ophthalmology)  Visit Date: 02/05/23  Subjective:   Chief Complaint  Patient presents with   Chronic pain management   Patient ZO:XWRUEA S Klem,Female DOB:15-Sep-1940,82 y.o. VWU:[id number]   83 y.o. Female presents today for follow-up for Pain Management. 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 She reports that her pain has been worsening with more stiffness in the morning that is relieved with 50 mg Tramadol, which she states she takes 1-2 tabs/day. 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 2 adult children. Retired from [employer]. Does not smoke. Rare alcohol consumption.  Review of Systems  Constitutional:  Negative for fever and malaise/fatigue.  HENT:  Negative for congestion.   Eyes:  Negative for blurred vision.  Respiratory:  Negative for cough and shortness of breath.   Cardiovascular:  Negative for chest pain, palpitations and leg swelling.  Gastrointestinal:  Negative for vomiting.  Musculoskeletal:  Positive for joint pain (hands bilaterally). Negative for back pain.       (+) Joint Stiffness  - hands bilaterally  Skin:  Negative for rash.  Neurological:  Negative for loss of consciousness and headaches.     Objective:  Vitals: BP (!) 160/80   Pulse 75   Ht 5\' 5"  (1.651 m)   Wt 168 lb (76.2 kg)   SpO2 97%   BMI 27.96 kg/m   Physical Exam Vitals and nursing note reviewed.  Constitutional:      General: She is not in acute distress.    Appearance: Normal appearance. She is not toxic-appearing.  HENT:     Head: Normocephalic and atraumatic.  Pulmonary:     Effort: Pulmonary effort is normal.  Musculoskeletal:     Comments: Heberden's nodes and Bouchard's nodes consistent with Osteoarthritis   Skin:    General: Skin is warm and dry.  Neurological:     Mental Status: She is alert and oriented to person, place, and time. Mental status is at baseline.  Psychiatric:        Mood and Affect: Mood normal.        Behavior: Behavior normal.        Thought Content: Thought content normal.        Judgment: Judgment normal.     Results:  Studies Obtained And Personally Reviewed By Me: Labs:     Component Value Date/Time   NA 137 01/03/2023 0909   K 4.3 01/03/2023 0909   CL 103 01/03/2023 0909   CO2 25  01/03/2023 0909   GLUCOSE 98 01/03/2023 0909   BUN 20 01/03/2023 0909   CREATININE 0.96 (H) 01/03/2023 0909   CALCIUM 9.6 01/03/2023 0909   PROT 7.1 01/03/2023 0909   ALBUMIN 4.5 11/25/2015 1039   AST 18 01/03/2023 0909   ALT 15 01/03/2023 0909   ALKPHOS 51 11/25/2015 1039   BILITOT 0.9 01/03/2023 0909   GFRNONAA 52 (L) 06/28/2020 0908   GFRAA 60 06/28/2020 0908    Lab Results  Component Value Date   WBC 4.5 01/03/2023   HGB 13.4 01/03/2023   HCT 42.3 01/03/2023   MCV 89.1 01/03/2023   PLT 220 01/03/2023   Lab Results  Component Value Date   CHOL 187 01/03/2023   HDL 68 01/03/2023   LDLCALC 99 01/03/2023   TRIG 105 01/03/2023   CHOLHDL 2.8 01/03/2023   Lab Results  Component Value Date   HGBA1C 6.2 (H) 01/03/2023    Lab Results  Component Value  Date   TSH 2.88 01/03/2023   Assessment & Plan:  Osteoarthritis: in hands bilaterally, does have Heberden's nodes and Bouchard's nodes consistent with Osteoarthritis. Can not take NSAIDS due to CKD stage II. She takes her medication reliably and responsibly, doesn't take more than 1-2 tablets a day as needed for pain management. She has signed her pain agreement contract and is agreeable to quarterly office visits for PDMP review. May take Tramadol 50 mg twice daily if needed for osteoarthritis of both hands. Return for follow up in 3 months.  Elevated BP reading- she may be anxious. She should monitor BP at home and call me if persistently elevated. Is on amlodipine 10 mg daily.   CKD stage 2 followed by [facility]. Cannot take NSAIDS. Creatinine is stable at 0.96  I,Emily Lagle,acting as a scribe for Sharon Mackintosh, MD.,have documented all relevant documentation on the behalf of Sharon Mackintosh, MD,as directed by  Sharon Mackintosh, MD while in the presence of Sharon Mackintosh, MD.   I, Sharon Mackintosh, MD, have reviewed all documentation for this visit. The documentation on 02/05/23 for the exam, diagnosis, procedures, and orders are all accurate and complete.

## 2023-02-05 NOTE — Patient Instructions (Addendum)
Monitor BP at home and let us know if persistently elevated. Urine drug screen collected. Rx for Tramadol 50 mg twice daily as needed. RTC in 3 months. Pain management contract signed.

## 2023-02-07 LAB — DRUG MONITORING, PANEL 8 WITH CONFIRMATION, URINE
6 Acetylmorphine: NEGATIVE ng/mL (ref <10)
Alcohol Metabolites: NEGATIVE ng/mL (ref <500)
Amphetamines: NEGATIVE ng/mL (ref <500)
Benzodiazepines: NEGATIVE ng/mL (ref <100)
Buprenorphine, Urine: NEGATIVE ng/mL (ref <5)
Cocaine Metabolite: NEGATIVE ng/mL (ref <150)
Creatinine: 33.2 mg/dL (ref >=20.0)
MDMA: NEGATIVE ng/mL (ref <500)
Marijuana Metabolite: NEGATIVE ng/mL (ref <20)
Opiates: NEGATIVE ng/mL (ref <100)
Oxidant: NEGATIVE ug/mL (ref <200)
Oxycodone: NEGATIVE ng/mL (ref <100)
pH: 5.7 (ref 4.5–9.0)

## 2023-02-07 LAB — DM TEMPLATE

## 2023-02-13 ENCOUNTER — Telehealth: Payer: Self-pay | Admitting: Internal Medicine

## 2023-02-13 NOTE — Telephone Encounter (Signed)
Received physical therapy orders to sign and fax back to [facility] [phone], phone [phone]    -------Fax Transmission Report-------  To:               Recipient at [phone] Subject:          Fw: Hp Scans Result:           The transmission was successful. Explanation:      All Pages Ok Pages Sent:       10 Connect Time:     5 minutes, 3 seconds Transmit Time:    02/07/2023 10:10 Transfer Rate:    14400 Status Code:      0000 Retry Count:      0 Job Id:           [id number] Unique Id:        [id number] Fax Line:         30 Fax Server:       [employer]

## 2023-03-25 ENCOUNTER — Encounter: Payer: Self-pay | Admitting: Internal Medicine

## 2023-04-08 ENCOUNTER — Telehealth: Payer: Self-pay | Admitting: Internal Medicine

## 2023-04-08 NOTE — Telephone Encounter (Signed)
 LVM to Sharon Peters and schedule an appointment before she runs out of medication, this week.

## 2023-04-11 ENCOUNTER — Encounter: Payer: Self-pay | Admitting: Internal Medicine

## 2023-04-11 ENCOUNTER — Ambulatory Visit (INDEPENDENT_AMBULATORY_CARE_PROVIDER_SITE_OTHER): Admitting: Internal Medicine

## 2023-04-11 VITALS — BP 140/70 | HR 86 | Temp 97.5°F | Resp 12 | Ht 65.0 in | Wt 168.6 lb

## 2023-04-11 DIAGNOSIS — E039 Hypothyroidism, unspecified: Secondary | ICD-10-CM | POA: Diagnosis not present

## 2023-04-11 DIAGNOSIS — N182 Chronic kidney disease, stage 2 (mild): Secondary | ICD-10-CM

## 2023-04-11 DIAGNOSIS — M7918 Myalgia, other site: Secondary | ICD-10-CM

## 2023-04-11 DIAGNOSIS — Z79899 Other long term (current) drug therapy: Secondary | ICD-10-CM | POA: Diagnosis not present

## 2023-04-11 DIAGNOSIS — M19041 Primary osteoarthritis, right hand: Secondary | ICD-10-CM

## 2023-04-11 DIAGNOSIS — I129 Hypertensive chronic kidney disease with stage 1 through stage 4 chronic kidney disease, or unspecified chronic kidney disease: Secondary | ICD-10-CM

## 2023-04-11 DIAGNOSIS — M19042 Primary osteoarthritis, left hand: Secondary | ICD-10-CM

## 2023-04-11 MED ORDER — TRAMADOL HCL 50 MG PO TABS: 50.0000 mg | ORAL_TABLET | Freq: Three times a day (TID) | ORAL | 0 refills | Status: DC | PRN

## 2023-04-11 MED ORDER — TRAMADOL HCL 50 MG PO TABS: 50.0000 mg | ORAL_TABLET | Freq: Three times a day (TID) | ORAL | 0 refills | Status: AC | PRN

## 2023-04-11 NOTE — Patient Instructions (Signed)
 Urine drug screen obtained. Referral to [facility].Tramadol is needed once daily to control musculoskeletal pain.

## 2023-04-11 NOTE — Progress Notes (Signed)
 Patient Care Team: Margaree Mackintosh, MD as PCP - General (Internal Medicine) [name], MD as PCP - Cardiology (Cardiology) [name], MD as Consulting Physician (Ophthalmology)  Visit Date: 04/11/23  Subjective:   Chief Complaint  Patient presents with   Osteoarthritis pain   Vitals:   04/11/23 1409 04/11/23 1449  BP: (!) 170/80 (!) 140/70  Patient ZO:XWRUEA S Dolley,Female DOB:October 24, 1940,82 y.o. VWU:[id number]   83 y.o. Female presents today for 3 months follow-up for Pain Management. Patient has a past medical history of Osteoarthritis; CKD, stage 2. Seen 02/05/2023 c/o stiffness in her hands related to her osteoarthritis, which had been previously relieved with Tramadol, so had requested to restart this. At that visit she had signed her pain agreement and provided a specimen for the drug screening (all negative and can be reviewed under "Labs"). Today, she says that she has only been needing 1 tablet daily for optimal management. Feels that she needs daily pain med to mange pain symptoms.  At the last visit her BP was elevated at 160/80, and today it is 170/80 and lowered to 140/70 after 40 minutes. She does take Amlodipine 10 mg daily and was instructed to monitor her blood pressures at home and report back if consistently elevated, and in-office she says that her blood pressures are normally only hypertensive during office appointments.   History of CKD stage 2 followed by [facility]. Due to her CKD she cannot take NSAIDS.   She mentioned some discoloration of the skin on her anterior lower left extremity w/o any traumatic encounter that she could think of that may have caused bruising. She says that it initially had been black at the top of the discoloration, but has improved and is barely even noticeable now.  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  Skin:        (+) Discoloration, LLE  All other systems reviewed and are negative.    Objective:  Vitals: BP (!) 170/80 (BP Location: Left Arm, Patient Position: Sitting, Cuff Size: Normal)   Pulse 86   Temp (!) 97.5 F (36.4 C) (Temporal)   Resp 12   Ht 5\' 5"  (1.651 m)   Wt 168 lb 9.6 oz (76.5 kg)   SpO2 92%   BMI 28.06 kg/m   Physical Exam Vitals and nursing note reviewed.  Constitutional:      General: She is not in acute distress.    Appearance: Normal appearance. She is not toxic-appearing.  HENT:  Head: Normocephalic and atraumatic.  Pulmonary:     Effort: Pulmonary effort is normal.  Skin:    General: Skin is warm and dry.     Comments: Mild mottled discoloration of anterior left lower extremity, reportedly the superior aspect was initially black according to patient, but this seems to have improved as discolored area is barely noticeable now  Neurological:     Mental Status: She is alert and oriented to person, place, and time. Mental status is at baseline.  Psychiatric:        Mood and Affect: Mood normal.        Behavior: Behavior normal.        Thought Content: Thought content normal.        Judgment: Judgment normal.     Results:  Studies Obtained And Personally Reviewed By  Me: Labs:     Component Value Date/Time   NA 137 01/03/2023 0909   K 4.3 01/03/2023 0909   CL 103 01/03/2023 0909   CO2 25 01/03/2023 0909   GLUCOSE 98 01/03/2023 0909   BUN 20 01/03/2023 0909   CREATININE 0.96 (H) 01/03/2023 0909   CALCIUM 9.6 01/03/2023 0909   PROT 7.1 01/03/2023 0909   ALBUMIN 4.5 11/25/2015 1039   AST 18 01/03/2023 0909   ALT 15 01/03/2023 0909   ALKPHOS 51 11/25/2015 1039   BILITOT 0.9 01/03/2023 0909   GFRNONAA 52 (L) 06/28/2020 0908   GFRAA 60 06/28/2020 0908    Lab Results  Component Value Date   WBC 4.5 01/03/2023   HGB 13.4 01/03/2023   HCT 42.3 01/03/2023   MCV 89.1 01/03/2023   PLT 220 01/03/2023   Lab Results  Component Value Date   CHOL 187 01/03/2023   HDL 68 01/03/2023   LDLCALC 99 01/03/2023   TRIG 105 01/03/2023   CHOLHDL 2.8 01/03/2023   Lab Results  Component Value Date   HGBA1C 6.2 (H) 01/03/2023    Lab Results  Component Value Date   TSH 2.88 01/03/2023   Results for orders placed or performed in visit on 02/05/23  DRUG MONITORING, PANEL 8 WITH CONFIRMATION, URINE   Collection Time: 02/05/23 11:15 AM  Result Value Ref Range   Alcohol Metabolites NEGATIVE <500 ng/mL   Amphetamines NEGATIVE <500 ng/mL   Benzodiazepines NEGATIVE <100 ng/mL   Buprenorphine, Urine NEGATIVE <5 ng/mL   Cocaine Metabolite NEGATIVE <150 ng/mL   6 Acetylmorphine NEGATIVE <10 ng/mL   Marijuana Metabolite NEGATIVE <20 ng/mL   MDMA NEGATIVE <500 ng/mL   Opiates NEGATIVE <100 ng/mL   Oxycodone NEGATIVE <100 ng/mL   Creatinine 33.2 > or = 20.0 mg/dL   pH 5.7 4.5 - 9.0   Oxidant NEGATIVE <200 mcg/mL  DM TEMPLATE   Collection Time: 02/05/23 11:15 AM  Result Value Ref Range   Notes and Comments     Assessment & Plan:  Osteoarthritis: in hands bilaterally. Can not take NSAIDS due to CKD stage II. She takes her medication reliably and responsibly, doesn't take more than 1-2 tablets every 12 hours for pain management. At last visit she had signed  her pain agreement contract and provided a urine specimen for drug screen (all negative as seen above). Specimen collected today as well. PDMP reviewed today and refilled Tramadol 50 mg #60 - will see her back in this office in 05/2023 for pain management and 06/2023 for annual visit.     Elevated BP of 170/80 and lowered to 140/70 after 40 minutes. She does take Amlodipine  10 mg daily. While in-office she says that her blood pressures are normally only hypertensive during office appointments.    CKD stage 2 followed by [facility]. Cannot take NSAIDS.   Discoloration of Skin of Lower Left Extremity w/o any traumatic encounter that she could think of that may have caused bruising. She says that it initially had been black at the top of the discoloration, but has improved and is barely even noticeable now. This is likely of no concern since discoloration has improved from when she first noticed it.    I,Emily Lagle,acting as a Neurosurgeon for Margaree Mackintosh, MD.,have documented all relevant documentation on the behalf of Margaree Mackintosh, MD,as directed by  Margaree Mackintosh, MD while in the presence of Margaree Mackintosh, MD.   I, Margaree Mackintosh, MD, have reviewed all documentation for this visit. The documentation on 04/11/23 for the exam, diagnosis, procedures, and orders are all accurate and complete.IMargaree Mackintosh, MD, have reviewed all documentation for this visit. The documentation on 04/11/23 for the exam, diagnosis, procedures, and orders are all accurate and complete.

## 2023-04-12 LAB — DRUG MONITORING, PANEL 8 WITH CONFIRMATION, URINE
6 Acetylmorphine: NEGATIVE ng/mL (ref <10)
Alcohol Metabolites: NEGATIVE ng/mL (ref <500)
Amphetamines: NEGATIVE ng/mL (ref <500)
Benzodiazepines: NEGATIVE ng/mL (ref <100)
Buprenorphine, Urine: NEGATIVE ng/mL (ref <5)
Cocaine Metabolite: NEGATIVE ng/mL (ref <150)
Creatinine: 22.7 mg/dL (ref >=20.0)
MDMA: NEGATIVE ng/mL (ref <500)
Marijuana Metabolite: NEGATIVE ng/mL (ref <20)
Opiates: NEGATIVE ng/mL (ref <100)
Oxidant: NEGATIVE ug/mL (ref <200)
Oxycodone: NEGATIVE ng/mL (ref <100)
pH: 5.5 (ref 4.5–9.0)

## 2023-04-12 LAB — DM TEMPLATE

## 2023-04-15 ENCOUNTER — Encounter: Payer: Self-pay | Admitting: Internal Medicine

## 2023-04-15 LAB — HM DIABETES EYE EXAM

## 2023-04-17 ENCOUNTER — Other Ambulatory Visit: Payer: Self-pay | Admitting: Internal Medicine

## 2023-04-29 ENCOUNTER — Telehealth: Payer: Self-pay | Admitting: Internal Medicine

## 2023-04-29 ENCOUNTER — Telehealth: Payer: Self-pay | Admitting: *Deleted

## 2023-04-29 DIAGNOSIS — M19041 Primary osteoarthritis, right hand: Secondary | ICD-10-CM

## 2023-04-29 DIAGNOSIS — M7918 Myalgia, other site: Secondary | ICD-10-CM

## 2023-04-29 DIAGNOSIS — G8929 Other chronic pain: Secondary | ICD-10-CM

## 2023-04-29 MED ORDER — TRAMADOL HCL 50 MG PO TABS: 50.0000 mg | ORAL_TABLET | Freq: Three times a day (TID) | ORAL | 0 refills | Status: AC | PRN

## 2023-04-29 NOTE — Telephone Encounter (Signed)
 I have called patient personally and she says she has not been able to get anyone at [facility] on the phone. She says there is no answer.   I have asked staff to make referral to physician we have used previously for chronic pain management and recommend a consultation with Dr. Lylia Sand at [facility] on [address] here in [place].   Could someone please make that referral? Someone also needs to call and let her know the phone number for Dr. Rayleen Cal  to call for appointment. I have refilled Tramadol for 5 days. She is to take this sparingly. We will be out of the office for one week. She is requiring more Tramadol than just a short supply. She does have an appt for April 29th here that she should keep as it make take a few weeks to get an appt .   Patient is aware of the transition with staffing here. I have asked her to keep appt here in late April. MJB, MD

## 2023-04-29 NOTE — Telephone Encounter (Signed)
 Copied from CRM [id number]. Topic: Clinical - Medication Refill >> Apr 24, 2023 10:36 AM Crispin Dolphin wrote: Most Recent Primary Care Visit:  Provider: Sylvan Evener  Department: [facility]  Visit Type: OFFICE VISIT  Date: 04/11/2023  Medication: traMADol (ULTRAM) 50 MG tablet - previously prescribed. Patient states she was referred to another doctor to take over but has been unable to reach anyone. Left another message with them today. She only has two left. Patient would like a call or message back. She sent provider message via MyChart. Is showing under 3/10 encounter.  Has the patient contacted their pharmacy? No (Agent: If no, request that the patient contact the pharmacy for the refill. If patient does not wish to contact the pharmacy document the reason why and proceed with request.) (Agent: If yes, when and what did the pharmacy advise?)  Is this the correct pharmacy for this prescription? Yes If no, delete pharmacy and type the correct one.  This is the patient's preferred pharmacy:  [id number] [place], [place] - [address] AT [facility] [address] [facility] [address] [place] [address] Phone: [phone] Fax: [phone]   Has the prescription been filled recently? Yes 3/27  Is the patient out of the medication? No only has enough until Friday   Has the patient been seen for an appointment in the last year OR does the patient have an upcoming appointment? Yes  Can we respond through MyChart? Yes  Agent: Please be advised that Rx refills may take up to 3 business days. We ask that you follow-up with your pharmacy.

## 2023-04-30 ENCOUNTER — Encounter: Payer: Self-pay | Admitting: Physical Medicine & Rehabilitation

## 2023-04-30 ENCOUNTER — Encounter: Payer: Self-pay | Admitting: Internal Medicine

## 2023-04-30 ENCOUNTER — Other Ambulatory Visit: Payer: Self-pay

## 2023-04-30 DIAGNOSIS — R52 Pain, unspecified: Secondary | ICD-10-CM

## 2023-04-30 NOTE — Addendum Note (Signed)
 Addended by: Marylou Sobers D on: 04/30/2023 08:46 AM   Modules accepted: Orders

## 2023-04-30 NOTE — Telephone Encounter (Signed)
 Referral placed.

## 2023-04-30 NOTE — Telephone Encounter (Signed)
 Referral has been placed.  Please call patient and give her number [phone]

## 2023-04-30 NOTE — Telephone Encounter (Signed)
 Spoke to patient, gave her the number for the referral.

## 2023-05-12 ENCOUNTER — Encounter (HOSPITAL_BASED_OUTPATIENT_CLINIC_OR_DEPARTMENT_OTHER): Payer: Self-pay

## 2023-05-12 ENCOUNTER — Other Ambulatory Visit: Payer: Self-pay

## 2023-05-12 ENCOUNTER — Emergency Department (HOSPITAL_BASED_OUTPATIENT_CLINIC_OR_DEPARTMENT_OTHER)
Admission: EM | Admit: 2023-05-12 | Discharge: 2023-05-12 | Disposition: A | Discharge status: Home / Self Care | Attending: Emergency Medicine | Admitting: Emergency Medicine

## 2023-05-12 DIAGNOSIS — L03012 Cellulitis of left finger: Secondary | ICD-10-CM | POA: Diagnosis not present

## 2023-05-12 DIAGNOSIS — L02512 Cutaneous abscess of left hand: Secondary | ICD-10-CM | POA: Diagnosis not present

## 2023-05-12 DIAGNOSIS — R2232 Localized swelling, mass and lump, left upper limb: Secondary | ICD-10-CM | POA: Diagnosis present

## 2023-05-12 MED ORDER — CEPHALEXIN 500 MG PO CAPS: 500.0000 mg | ORAL_CAPSULE | Freq: Four times a day (QID) | ORAL | 0 refills | Status: DC

## 2023-05-12 MED ORDER — DOXYCYCLINE HYCLATE 100 MG PO CAPS: 100.0000 mg | ORAL_CAPSULE | Freq: Two times a day (BID) | ORAL | 0 refills | Status: DC

## 2023-05-12 MED ORDER — DOXYCYCLINE HYCLATE 100 MG PO TABS
100.0000 mg | ORAL_TABLET | Freq: Once | ORAL | Status: AC
  Administered 2023-05-12: 100 mg via ORAL
  Filled 2023-05-12: qty 1

## 2023-05-12 MED ORDER — CEPHALEXIN 250 MG PO CAPS
500.0000 mg | ORAL_CAPSULE | Freq: Once | ORAL | Status: AC
  Administered 2023-05-12: 500 mg via ORAL
  Filled 2023-05-12: qty 2

## 2023-05-12 MED ORDER — ACETAMINOPHEN 325 MG PO TABS
650.0000 mg | ORAL_TABLET | Freq: Once | ORAL | Status: AC
  Administered 2023-05-12: 650 mg via ORAL
  Filled 2023-05-12: qty 2

## 2023-05-12 MED ORDER — ACETAMINOPHEN ER 650 MG PO TBCR: 650.0000 mg | EXTENDED_RELEASE_TABLET | Freq: Three times a day (TID) | ORAL | 0 refills | Status: AC | PRN

## 2023-05-12 MED ORDER — LIDOCAINE HCL (PF) 1 % IJ SOLN
30.0000 mL | Freq: Once | INTRAMUSCULAR | Status: AC
  Administered 2023-05-12: 30 mL
  Filled 2023-05-12: qty 30

## 2023-05-12 NOTE — Discharge Instructions (Addendum)
 You were seen in the emergency room for what appears to be paronychia or infection of the nail fold.  Please perform warm soaks 3-4 times a day.  You may add salt to the solution. Make sure that you keep the thumb clean, dry and covered.  Take the antibiotics that are prescribed and call the orthopedic doctor tomorrow, for a follow-up this week.  Return to the emergency room if you start having increasing swelling, pain over the thumb and hand.

## 2023-05-12 NOTE — ED Notes (Signed)
 Suture cart, I&D tray, med at bedside

## 2023-05-12 NOTE — ED Triage Notes (Signed)
 Patient presents to the ED with c/o L thumb pain. Swelling and discoloration noted to nailbed. Patient had a recent manicure. Sensation intact.

## 2023-05-12 NOTE — ED Provider Notes (Signed)
 [place] EMERGENCY DEPARTMENT AT [facility] Provider Note   CSN: [id number] Arrival date & time: 05/12/23  1125     History  Chief Complaint  Patient presents with   Hand Pain    Sharon Peters is a 83 y.o. female.  HPI    83 year old female comes in with chief complaint of thumb pain.  Patient has history of diabetes.  She states that she started having swelling and discomfort of her left thumb about 2 or 3 days ago.  The swelling has worsened over time.  At first she saw some changes around the lateral cuticle, and then the thumb swelled up.  She denies any fevers, chills.  Pain is throbbing in nature.  Home Medications Prior to Admission medications   Medication Sig Start Date End Date Taking? Authorizing Provider  acetaminophen (TYLENOL) 500 MG tablet Take 500 mg by mouth every 6 (six) hours as needed.    [provider]  amLODipine  (NORVASC ) 10 MG tablet Take 1 tablet by mouth daily. 06/23/20   [provider]  amoxicillin (AMOXIL) 500 MG tablet Take 2,000 mg by mouth once. BEFORE DENTIST APPOINTMENT 12/21/22   [provider]  diclofenac  Sodium (VOLTAREN ) 1 % GEL APPLY 4 GRAMS TOPICALLY TO THE AFFECTED AREA FOUR TIMES DAILY 11/13/22   Sylvan Evener, MD  FARXIGA 5 MG TABS tablet Take 5 mg by mouth daily. 06/23/20   [provider]  hydrocortisone 2.5 % cream APPLY TO THE AFFECTED AREA ON FACE TWICE DAILY FOR UP TO 1 WEEK ON AND 1 WEEK OFF AS NEEDED 01/27/19   [provider]  levothyroxine  (SYNTHROID ) 75 MCG tablet TAKE 1 TABLET BY MOUTH 30 MINUTES BEFORE BREAKFAST ON AN EMPTY STOMACH 04/17/23   Sylvan Evener, MD  metFORMIN  (GLUCOPHAGE ) 500 MG tablet TAKE 1 TABLET BY MOUTH EVERY DAY WITH BREAKFAST 04/17/23   Sylvan Evener, MD  ONE TOUCH ULTRA TEST test strip USE DAILY TO TEST GLUCOSE 10/03/17   [provider]  senna (SENOKOT) 8.6 MG tablet Take 1 tablet by mouth as needed for constipation.    [provider]   zolpidem  (AMBIEN ) 5 MG tablet TAKE ONE TABLET BY MOUTH AT BEDTIME AS NEEDED FOR SLEEP 07/09/22   Sylvan Evener, MD      Allergies    Celecoxib    Review of Systems   Review of Systems  All other systems reviewed and are negative.   Physical Exam Updated Vital Signs BP (!) 150/67 (BP Location: Right Arm)   Pulse 82   Temp 98.7 F (37.1 C)   Resp 18   Ht 5\' 5"  (1.651 m)   Wt 73.9 kg   SpO2 97%   BMI 27.12 kg/m  Physical Exam Vitals and nursing note reviewed.  Constitutional:      Appearance: She is well-developed.  HENT:     Head: Atraumatic.  Cardiovascular:     Rate and Rhythm: Normal rate.  Pulmonary:     Effort: Pulmonary effort is normal.  Musculoskeletal:     Cervical back: Normal range of motion and neck supple.     Comments: Left thumb has edema over the pad, and diffusely.  There is tenderness to palpation over the pad and the thumb diffusely.  Patient has skin discoloration over the lateral nail fold  Skin:    General: Skin is warm and dry.  Neurological:     Mental Status: She is alert and oriented to person, place, and time.  ED Results / Procedures / Treatments   Labs (all labs ordered are listed, but only abnormal results are displayed) Labs Reviewed - No data to display  EKG None  Radiology No results found.  Procedures .Nerve Block  Date/Time: 05/12/2023 3:50 PM  Performed by: Deatra Face, MD Authorized by: Deatra Face, MD   Consent:    Consent obtained:  Verbal   Consent given by:  Patient   Risks discussed:  Infection, pain, nerve damage, unsuccessful block and bleeding   Alternatives discussed:  No treatment Universal protocol:    Procedure explained and questions answered to patient or proxy's satisfaction: yes     Immediately prior to procedure, a time out was called: yes     Patient identity confirmed:  Arm band Indications:    Indications:  Procedural anesthesia and pain relief Location:    Body area:  Upper  extremity   Upper extremity nerve blocked: Thumb.   Laterality:  Left Pre-procedure details:    Skin preparation:  Alcohol   Preparation: Patient was prepped and draped in usual sterile fashion   Skin anesthesia:    Skin anesthesia method:  None Procedure details:    Block needle gauge:  25 G   Anesthetic injected:  Lidocaine 1% w/o epi   Steroid injected:  None   Additive injected:  None   Injection procedure:  Anatomic landmarks identified, incremental injection, negative aspiration for blood and introduced needle   Paresthesia:  None Post-procedure details:    Dressing:  None   Outcome:  Pain relieved   Procedure completion:  Tolerated well, no immediate complications .Incision and Drainage  Date/Time: 05/12/2023 3:51 PM  Performed by: Deatra Face, MD Authorized by: Deatra Face, MD   Consent:    Consent obtained:  Verbal   Consent given by:  Patient   Risks, benefits, and alternatives were discussed: yes     Risks discussed:  Bleeding, incomplete drainage, infection, pain and damage to other organs   Alternatives discussed:  Delayed treatment Universal protocol:    Procedure explained and questions answered to patient or proxy's satisfaction: yes     Immediately prior to procedure, a time out was called: yes     Patient identity confirmed:  Arm band Location:    Type:  Abscess   Location:  Upper extremity   Upper extremity location:  Finger   Finger location:  L thumb Pre-procedure details:    Skin preparation:  Antiseptic wash Sedation:    Sedation type:  None Anesthesia:    Anesthesia method:  Topical application Procedure type:    Complexity:  Complex Procedure details:    Ultrasound guidance: no     Needle aspiration: yes     Needle size:  18 G   Incision types:  Stab incision   Incision depth:  Subcutaneous   Wound management:  Probed and deloculated   Drainage:  Purulent and serosanguinous   Drainage amount:  Moderate   Packing materials:   None Post-procedure details:    Procedure completion:  Tolerated well, no immediate complications     Medications Ordered in ED Medications  lidocaine (PF) (XYLOCAINE) 1 % injection 30 mL (30 mLs Infiltration Given by Other 05/12/23 1521)  cephALEXin (KEFLEX) capsule 500 mg (500 mg Oral Given 05/12/23 1519)  doxycycline (VIBRA-TABS) tablet 100 mg (100 mg Oral Given 05/12/23 1519)  acetaminophen (TYLENOL) tablet 650 mg (650 mg Oral Given 05/12/23 1519)    ED Course/ Medical Decision Making/ A&P  Medical Decision Making Risk OTC drugs. Prescription drug management.   83 year old female comes her chief complaint of left thumb pain.  Based on assessment, it appears that patient has left thumb paronychia. Differential diagnosis also included felon, cellulitis.  Low suspicion for of extension of the infection into pad over volar aspect at this time.  Patient consented for nerve block followed by I&D.  We were successfully able to drain abscess from the nail fold.  There is no evidence of any extension into the eponychia.  The fat pad and rest of the digit was softer post drainage.  I did discuss with the patient that I can extend the incision into the pad and rule out felon or patient can follow-up with orthopedic doctor in 3 days, which we would want anyways, and if not better, the orthopedic surgery can perform bedside procedure.  She prefers the latter at this time.  Will put her on Doxy and Keflex.  Wound care precautions and return precautions discussed.  Final Clinical Impression(s) / ED Diagnoses Final diagnoses:  None    Rx / DC Orders ED Discharge Orders     None         Deatra Face, MD 05/12/23 1555

## 2023-05-14 ENCOUNTER — Ambulatory Visit: Payer: Medicare Other | Admitting: Internal Medicine

## 2023-05-14 VITALS — BP 158/80 | HR 81 | Temp 98.3°F | Ht 65.0 in | Wt 168.8 lb

## 2023-05-14 DIAGNOSIS — L818 Other specified disorders of pigmentation: Secondary | ICD-10-CM | POA: Diagnosis not present

## 2023-05-14 DIAGNOSIS — M19042 Primary osteoarthritis, left hand: Secondary | ICD-10-CM

## 2023-05-14 DIAGNOSIS — I10 Essential (primary) hypertension: Secondary | ICD-10-CM

## 2023-05-14 DIAGNOSIS — M1990 Unspecified osteoarthritis, unspecified site: Secondary | ICD-10-CM

## 2023-05-14 DIAGNOSIS — M19041 Primary osteoarthritis, right hand: Secondary | ICD-10-CM

## 2023-05-14 DIAGNOSIS — L03012 Cellulitis of left finger: Secondary | ICD-10-CM | POA: Diagnosis not present

## 2023-05-14 DIAGNOSIS — R030 Elevated blood-pressure reading, without diagnosis of hypertension: Secondary | ICD-10-CM | POA: Diagnosis not present

## 2023-05-14 DIAGNOSIS — Z23 Encounter for immunization: Secondary | ICD-10-CM | POA: Diagnosis not present

## 2023-05-14 DIAGNOSIS — R52 Pain, unspecified: Secondary | ICD-10-CM

## 2023-05-14 DIAGNOSIS — N182 Chronic kidney disease, stage 2 (mild): Secondary | ICD-10-CM

## 2023-05-14 DIAGNOSIS — M7918 Myalgia, other site: Secondary | ICD-10-CM

## 2023-05-14 LAB — CBC WITH DIFFERENTIAL/PLATELET
Absolute Lymphocytes: 1301 {cells}/uL (ref 850–3900)
Absolute Monocytes: 816 {cells}/uL (ref 200–950)
Basophils Absolute: 31 {cells}/uL (ref 0–200)
Basophils Relative: 0.6 %
Eosinophils Absolute: 51 {cells}/uL (ref 15–500)
Eosinophils Relative: 1 %
HCT: 42.2 % (ref 35.0–45.0)
Hemoglobin: 14.2 g/dL (ref 11.7–15.5)
MCH: 29.1 pg (ref 27.0–33.0)
MCHC: 33.6 g/dL (ref 32.0–36.0)
MCV: 86.5 fL (ref 80.0–100.0)
MPV: 11 fL (ref 7.5–12.5)
Monocytes Relative: 16 %
Neutro Abs: 2902 {cells}/uL (ref 1500–7800)
Neutrophils Relative %: 56.9 %
Platelets: 246 10*3/uL (ref 140–400)
RBC: 4.88 10*6/uL (ref 3.80–5.10)
RDW: 12.6 % (ref 11.0–15.0)
Total Lymphocyte: 25.5 %
WBC: 5.1 10*3/uL (ref 3.8–10.8)

## 2023-05-14 MED ORDER — TRAMADOL HCL 50 MG PO TABS: 50.0000 mg | ORAL_TABLET | Freq: Two times a day (BID) | ORAL | 0 refills | Status: DC

## 2023-05-14 MED ORDER — CEFTRIAXONE SODIUM 1 G IJ SOLR
1.0000 g | Freq: Once | INTRAMUSCULAR | Status: AC
Start: 2023-05-14 — End: 2023-05-14
  Administered 2023-05-14: 1 g via INTRAMUSCULAR

## 2023-05-14 MED ORDER — DOXYCYCLINE HYCLATE 100 MG PO TABS
100.0000 mg | ORAL_TABLET | Freq: Two times a day (BID) | ORAL | 0 refills | Status: DC
Start: 2023-05-14 — End: 2023-09-05

## 2023-05-14 NOTE — Progress Notes (Signed)
 Patient Care Team: Sylvan Evener, MD as PCP - General (Internal Medicine) Bridgette Campus, MD as PCP - Cardiology (Cardiology) Dema Filler, MD as Consulting Physician (Ophthalmology)  Visit Date: 05/14/23  Subjective:   Chief Complaint  Patient presents with   ED follow up paronychia   Vitals:   05/14/23 1029 05/14/23 1112 05/14/23 1137  BP: (!) 166/86 (!) 170/90 (!) 158/80   Patient ZO:XWRUEA S Peddie,Female DOB:August 01, 1940,82 y.o. VWU:[id number]   83 y.o. Female presents today for follow-up for both Chronic Pain Management and ED follow up for paronychia which required I and D in the ED  on April 27th.   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 Has not had recent Rheumatology studies. Had positive RF in 2015. Saw Dr. Alva Jewels, Rheumatologist at [facility] at that time for Rheumatology consult who felt she did not have Rheumatoid Arthritis and that positive RF was a false positive result.   Subsequently, developed elevated serum creatinine and began to be followed at [facility] in 2018. 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 Cannot taked NSAIDS due to CKD.  Hx of HTN thought to contribute to CKD as well as Type 2 Diabetes mellitus which is treated with Metformin .   Cannot take NSAIDS due to hx of CKD Stage 2. Feels that musculoskeletal pain has worsened over past few months. Taking Tramadol  50 mg once daily for her pain. Has an appointment with Physical Medicine and Rehab in June to discuss treatment of chronic pain, but today needs Tramadol  refilled.     Other issue is  that she presented to ED on Sunday, 05/12/2023 w/ Paronychia of  Left Thumb  requiring I and D. She was discharged home with Cephalexin 500 mg 4x daily for 7 days as well as Doxycycline 100 mg 2x daily for 7 days. Mentions that she had a manicure about  2 weeks ago, but isn't sure whether she had an injury  during the manicure or not. Does not recall pain or bleeding. Does not recall having had a  hangnail.  Does say she noticed cracking around her thumbnail/cuticle area and then developed redness or swelling on Saturday April 26th. This prompted ED visit on Sunday April 27th.   She is not UTD on Tdap, she is agreeable to receiving this today.   She underwent Cataract Surgery by Dr. Dema Filler recently and was planning on having cataract surgery on the  other eye done in May. I feel she should defer that surgery for now.  Also mentions that she is having a reoccurrence of discoloration on her anterior left leg. She says that she doesn't remember any recent injury and denies falling.   At her last appointment on 3/27 her blood pressure was hypertensive at 170/80, lowered to 140/70  40 minutes later. Today it is 166/86, 170/90 on recheck  43 minutes later, and finally 158/80  25 minutes after that. Takes Amlodipine  10 mg daily. In previous visits has said that her blood pressure is usually hypertensive during office visits, but normal at home.  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 Is divorced. Has 2 adult children, a son and a daughter. She is retired from [employer]. Does not smoke. Rare alcohol consumption.  Review of  Systems  Musculoskeletal:  Positive for joint pain.  Skin:        (+) Paronychia, Left Thumb (+) Discoloration, Left Anterior Leg  All other systems reviewed and are negative.    Objective:  Vitals: BP (!) 158/80   Pulse 81   Temp 98.3 F (36.8 C) (Temporal)   Ht 5\' 5"  (1.651 m)   Wt 168 lb 12.8 oz (76.6 kg)   SpO2 93%   BMI 28.09 kg/m   Physical Exam Vitals and nursing note reviewed.  Constitutional:      General: She is not in acute distress.    Appearance: Normal appearance. She is not toxic-appearing.  HENT:     Head: Normocephalic and atraumatic.  Pulmonary:     Effort: Pulmonary effort is normal.  Skin:    General: Skin is warm and dry.     Findings: Erythema and wound present.     Comments: Blackening on right laterality of left thumb  Area of purpling discoloration on anterior left leg   Neurological:     Mental Status: She is alert and oriented to person, place, and time. Mental status is at baseline.  Psychiatric:        Mood and Affect: Mood normal.        Behavior: Behavior normal.        Thought Content: Thought content normal.        Judgment: Judgment normal.    Results:  Studies Obtained And Personally Reviewed By Me: Labs:     Component Value Date/Time   NA 137 01/03/2023 0909   K 4.3 01/03/2023 0909   CL 103 01/03/2023 0909   CO2 25 01/03/2023 0909   GLUCOSE 98 01/03/2023 0909   BUN 20 01/03/2023 0909   CREATININE 0.96 (H) 01/03/2023 0909   CALCIUM 9.6 01/03/2023 0909   PROT 7.1 01/03/2023 0909   ALBUMIN 4.5 11/25/2015 1039   AST 18 01/03/2023 0909   ALT 15 01/03/2023 0909   ALKPHOS 51 11/25/2015 1039   BILITOT 0.9 01/03/2023 0909   GFRNONAA 52 (L) 06/28/2020 0908   GFRAA 60 06/28/2020 0908    Lab Results  Component Value Date   WBC 4.5 01/03/2023   HGB 13.4 01/03/2023   HCT 42.3 01/03/2023   MCV 89.1 01/03/2023   PLT 220 01/03/2023   Lab Results  Component Value Date   CHOL 187 01/03/2023   HDL 68 01/03/2023   LDLCALC 99  01/03/2023   TRIG 105 01/03/2023   CHOLHDL 2.8 01/03/2023   Lab Results  Component Value Date   HGBA1C 6.2 (H) 01/03/2023    Lab Results  Component Value Date   TSH 2.88 01/03/2023   Assessment & Plan:   Orders Placed This Encounter  Procedures   Tdap vaccine  greater than or equal to 7yo IM   CBC with Differential/Platelet   Osteoarthritis: Taking Tramadol  50 mg once daily for her pain. Has an appointment with Dr. Rayleen Cal in June but has run out of  Tramadol  and requests a refill.  This was Refilled today.   Chronic Kidney Disease, stage 2 followed by [facility]. Cannot take NSAIDs due to this.   Presented to ED on 05/12/2023 w/ Paronychia, Left Thumb where she was discharged with Cephalexin 500 mg 4x daily for 7 days, Doxycycline 100 mg 2x daily for 7 days. Mentions having gotten a manicure 2 weeks, but isn't sure whether she'd been nicked; say she noticed cracking around her thumb and then redness or swelling on Saturday. Not UTD on Tdap, and  she is agreeable to receiving this today. Scheduled for follow up with hand surgeon, Friday, May 2nd  (Dr. Primus Brookes)  Cataract Surgery by Dr. Dema Filler recently and was planning on having her 2nd one done in May. I have asked her to call Dr. McCuen and postpone this surgery until paronychia can be resolved.  Also mentions that she is having a reoccurrence of Discoloration, Anterior Left Leg. She says that she doesn't remember bumping into anything that would be attributed to this. Recommend she see Dermatology about this.   Elevated Blood Pressure: at her last appointment on 3/27 her blood pressure was elevated at 170/80, lowered to 140/70  40 minutes later. Today it is 166/86, 170/90 on recheck  43 minutes later, and finally 158/80  25 minutes after that. Takes Amlodipine  10 mg daily. In previous visits has said that her blood pressure is usually elevated during office visits, but normal at home. Continue to monitor.     I,Emily Lagle,acting as a Neurosurgeon for Sylvan Evener, MD.,have documented all relevant documentation on the behalf of Sylvan Evener, MD,as directed by  Sylvan Evener, MD while in the presence of Sylvan Evener, MD.   I, Sylvan Evener, MD, have reviewed all documentation for this visit. The documentation on 05/15/23 for the exam, diagnosis, procedures, and orders are all accurate and complete.

## 2023-05-14 NOTE — Progress Notes (Unsigned)
 Pain

## 2023-05-14 NOTE — Patient Instructions (Addendum)
 One gram IM Rocephin given. Start Doxycycline 100 mg twice daily for 7 days. Follow up with Dr. Annamae Barrett on Friday. He cannot see sooner. Tdap given today.

## 2023-05-15 ENCOUNTER — Encounter: Payer: Self-pay | Admitting: Internal Medicine

## 2023-05-15 LAB — AEROBIC CULTURE W GRAM STAIN (SUPERFICIAL SPECIMEN): Gram Stain: NONE SEEN

## 2023-05-16 ENCOUNTER — Telehealth (HOSPITAL_BASED_OUTPATIENT_CLINIC_OR_DEPARTMENT_OTHER): Payer: Self-pay | Admitting: *Deleted

## 2023-05-16 NOTE — Telephone Encounter (Signed)
 Post ED Visit - Positive Culture Follow-up  Culture report reviewed by antimicrobial stewardship pharmacist: [facility] [x]  Mohammed Andrew, Pharm.D. []  Skeet Duke, Pharm.D., BCPS AQ-ID []  Leslee Rase, Pharm.D., BCPS []  Garland Junk, Pharm.D., BCPS []  [place], [place].D., BCPS, AAHIVP []  Alcide Aly, Pharm.D., BCPS, AAHIVP []  Jerri Morale, PharmD, BCPS []  Graham Laws, PharmD, BCPS []  Cleda Curly, PharmD, BCPS []  Tamar Fairly, PharmD []  Ballard Levels, PharmD, BCPS []  Ollen Beverage, PharmD  [facility] []  Arlyne Bering, PharmD []  Sherryle Don, PharmD []  Van Gelinas, PharmD []  Delila Felty, Rph []  [place]una [place]) Cleora Daft, PharmD []  Augustina Block, PharmD []  Arie Kurtz, PharmD []  Sharlyn Deaner, PharmD []  Agnes Hose, PharmD []  Kendall Pauls, PharmD []  Gladstone Lamer, PharmD []  Armanda Bern, PharmD []  Tera Fellows, PharmD   Positive wound culture Treated with Cephalexin  and Doxycyline, organism sensitive to the same and no further patient follow-up is required at this time.  Sharon Peters 05/16/2023, 9:47 AM

## 2023-05-17 ENCOUNTER — Telehealth: Payer: Self-pay | Admitting: Internal Medicine

## 2023-05-17 ENCOUNTER — Other Ambulatory Visit: Payer: Self-pay | Admitting: Internal Medicine

## 2023-05-17 ENCOUNTER — Encounter: Payer: Self-pay | Admitting: Internal Medicine

## 2023-05-17 DIAGNOSIS — M19041 Primary osteoarthritis, right hand: Secondary | ICD-10-CM

## 2023-05-17 DIAGNOSIS — L03012 Cellulitis of left finger: Secondary | ICD-10-CM

## 2023-05-17 DIAGNOSIS — R52 Pain, unspecified: Secondary | ICD-10-CM

## 2023-05-17 NOTE — Telephone Encounter (Signed)
 Patient has chronic musculoskeletal pain and has appointment with pain management  June on June 12 as a new patient. Also has paronychia of thumb seeing orthopedist.

## 2023-05-17 NOTE — Telephone Encounter (Signed)
 Spoke with patient. She has follow up with orthopedist today about paronychia. She has new pt appt with pain management on June 12th. She needs refill on Tramadol . Have sent #60 to take sparingly twice daily which should last. She takes medication reliably and responsibly. Patient she is feeling better with regard to paronychia.MJB, MD

## 2023-06-27 ENCOUNTER — Encounter: Attending: Physical Medicine & Rehabilitation | Admitting: Physical Medicine & Rehabilitation

## 2023-06-27 ENCOUNTER — Encounter: Payer: Self-pay | Admitting: Physical Medicine & Rehabilitation

## 2023-06-27 VITALS — BP 153/71 | HR 68 | Ht 65.0 in | Wt 169.4 lb

## 2023-06-27 DIAGNOSIS — M19042 Primary osteoarthritis, left hand: Secondary | ICD-10-CM | POA: Insufficient documentation

## 2023-06-27 DIAGNOSIS — Z5181 Encounter for therapeutic drug level monitoring: Secondary | ICD-10-CM | POA: Insufficient documentation

## 2023-06-27 DIAGNOSIS — M19041 Primary osteoarthritis, right hand: Secondary | ICD-10-CM | POA: Insufficient documentation

## 2023-06-27 DIAGNOSIS — Z79891 Long term (current) use of opiate analgesic: Secondary | ICD-10-CM | POA: Diagnosis present

## 2023-06-27 DIAGNOSIS — M533 Sacrococcygeal disorders, not elsewhere classified: Secondary | ICD-10-CM | POA: Diagnosis present

## 2023-06-27 DIAGNOSIS — G894 Chronic pain syndrome: Secondary | ICD-10-CM | POA: Insufficient documentation

## 2023-06-27 NOTE — Progress Notes (Signed)
 HPI  Sharon Peters is a 83 y.o. 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   They are presenting to PM&R clinic as a new patient for pain management evaluation. They were referred by Dr. Karlyne Oxford for treatment of b/l hand pain.   Pain is a little worse in her R hand than left hand.  Activity worsens her pain. She has had handpain for many years. She was on diclofenac  until she developed CKD.  She is using arthritis gel, prior use of voltaren  gel.  Stiff and painful throughout the day. Pain sevarity varies throughout the day.  She is having trouble opening buttons with her hands.  She was Rheumatology several years ago and pt says she was restarted on diclofenac  gel and f/u PCP.     Red flag symptoms: No red flags for back pain endorsed in Hx or ROS  Medications tried: Topical medications arthritis gel, voltaren  gel Nsaids  stopped due to CKD Tylenol   - mild benefit Opiates  - tramadol - helps her pain  Gabapentin / Lyrica- Denies  TCAs  - Denies  SNRIs - Denies  Other - Denies  Other treatments: PT- After TKA and balance TENs unit - Denies  Injections - knee injections in the past for OA  Surgery - TKA left   Prior UDS results: No results found for: LABOPIA, COCAINSCRNUR, LABBENZ, AMPHETMU, THCU, LABBARB

## 2023-06-27 NOTE — Progress Notes (Addendum)
 Subjective:    Patient ID: Sharon Peters, female    DOB: 05/19/1940, 83 y.o.   MRN: [id number]  HPI  HPI  Sharon Peters is a 83 y.o. 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   They are presenting to PM&R clinic as a new patient for pain management evaluation. They were referred by Dr. Ronal Hailstone for treatment of b/l hand pain.   Thank you for referral of this very pleasant patient!  She has had hand pain for many years. Pain is a little worse in her R hand than left hand.  Activity worsens her pain. She has a lot of stiffness in her hands with activities.  She was on diclofenac  oral in the past until she developed CKD and this was stopped.  Pain severity varies throughout the day.  She is having trouble opening buttons with her hands and doing activities with her hands.  She is using arthritis gel, she has had prior use of voltaren  gel.  She was Rheumatology several years ago and pt says she was restarted on diclofenac  gel and advised f/u with PCP. Pt reports she uses tramadol  usually once a day and this is helping her keep her pain controlled. She would like to continue this medication for when he pain is severe.   She has had tailbone pain that started after prior left TKA. She has also had issues with L shoulder pain after and injury. She completed pt for her tailbone pain.   Red flag symptoms: No red flags for back pain endorsed in Hx or ROS  Medications tried: Topical medications arthritis gel, voltaren  gel Nsaids  stopped due to CKD Tylenol   - mild benefit Opiates  - tramadol - helps her pain  Gabapentin / Lyrica- Denies  TCAs  - Denies  SNRIs - Denies  Other - Denies  Other treatments: PT- for after TKA and to improve balance TENs unit - Denies  Injections - knee  injections in the past for OA  Surgery - TKA left  Prior UDS results: No results found for: LABOPIA, COCAINSCRNUR, LABBENZ, AMPHETMU, THCU, LABBARB   Pain Inventory Average Pain 3 Pain Right Now 4 My pain is intermittent, tingling, aching, and stiffness  In the last 24 hours, has pain interfered with the following? General activity 2 Relation with others 2 Enjoyment of life 2 What TIME of day is your pain at its worst? morning , daytime, evening, night, and varies Sleep (in general) Fair  Pain is worse with: some activites Pain improves with: medication Relief from Meds: 7  walk without assistance ability to climb steps?  yes do you drive?  yes  retired  No problems in this area  Any changes since last visit?  no  Any changes since last visit?  no    Family History  Problem Relation Age of Onset   Cancer Mother    Cancer Father    Breast cancer Brother    Diabetes Other        2 siblings   Cancer Other    Social History   Socioeconomic History   Marital status: Divorced    Spouse name: Not on file   Number of children: Not on file   Years of education: Not on file   Highest education level: 12th grade  Occupational History  Occupation: retired  Tobacco Use   Smoking status: Never   Smokeless tobacco: Never  Vaping Use   Vaping status: Never Used  Substance and Sexual Activity   Alcohol use: Yes    Comment: rarely   Drug use: Not on file   Sexual activity: Not on file  Other Topics Concern   Not on file  Social History Narrative   Not on file   Social Drivers of Health   Financial Resource Strain: Low Risk  (01/07/2023)   Overall Financial Resource Strain (CARDIA)    Difficulty of Paying Living Expenses: Not hard at all  Food Insecurity: No Food Insecurity (01/07/2023)   Hunger Vital Sign    Worried About Running Out of Food in the Last Year: Never true    Ran Out of Food in the Last Year: Never true  Transportation Needs: No  Transportation Needs (01/07/2023)   PRAPARE - Administrator, Civil Service (Medical): No    Lack of Transportation (Non-Medical): No  Physical Activity: Insufficiently Active (01/07/2023)   Exercise Vital Sign    Days of Exercise per Week: 4 days    Minutes of Exercise per Session: 30 min  Stress: No Stress Concern Present (01/07/2023)   [employer] of Occupational Health - Occupational Stress Questionnaire    Feeling of Stress : Not at all  Social Connections: Moderately Integrated (01/07/2023)   Social Connection and Isolation Panel    Frequency of Communication with Friends and Family: More than three times a week    Frequency of Social Gatherings with Friends and Family: Three times a week    Attends Religious Services: 1 to 4 times per year    Active Member of Clubs or Organizations: Yes    Attends Banker Meetings: 1 to 4 times per year    Marital Status: Divorced   Past Surgical History:  Procedure Laterality Date   left arthroscopic knee surgery     REPLACEMENT TOTAL KNEE Left 05/25/2021   squamous cell carcinoma remo     05/2022   TUBAL LIGATION     Past Medical History:  Diagnosis Date   Chest discomfort    Nuclear, December, 2008, normal   Diabetes mellitus type II    Ejection fraction    EF 65%, February, 2010, trivial pericardial effusion,   HTN (hypertension)    Hypothyroidism    Obesity    Pericardial effusion    Trivial, echo, February, 2011  /     small...int he past...unexplained...normal sedimentation rate and ANA   Psoriasis    Squamous cell carcinoma, leg, 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   BP (!) 153/71 (BP Location: Left Arm, Patient Position: Sitting)   Pulse 68   Ht 5' 5 (1.651 m)   Wt 169 lb 6.4 oz (76.8 kg)   SpO2 97%   BMI 28.19 kg/m   Opioid Risk Score:   Fall Risk Score:  `1  Depression screen [facility] 2/9     06/27/2023    2:37 PM 05/14/2023   10:28 AM 01/07/2023   11:03 AM 01/02/2022    9:58 AM 05/09/2021   12:01 PM  12/29/2020   11:08 AM 12/29/2020   11:07 AM  Depression screen PHQ 2/9  Decreased Interest 0 0 0 0 0 0 0  Down, Depressed, Hopeless 0 0 0 0 0 0 0  PHQ - 2 Score 0 0 0 0 0 0 0  Altered sleeping     0 0   Tired, decreased  energy     0 0   Change in appetite     0 0   Feeling bad or failure about yourself      0 0   Trouble concentrating     0 0   Moving slowly or fidgety/restless     0 0   Suicidal thoughts     0 0   PHQ-9 Score     0 0   Difficult doing work/chores     Not difficult at all Not difficult at all      Review of Systems  All other systems reviewed and are negative.      Objective:   Physical Exam Gen: no distress, normal appearing HEENT: oral mucosa pink and moist, NCAT Chest: normal effort, normal rate of breathing Abd: soft, non-distended Ext: no edema Psych: very pleasant, normal affect Skin: intact Neuro: Alert and awake, follows commands, cranial nerves II through XII grossly intact, normal speech and language RUE: 4/5 Deltoid, 4+/5 Biceps, 4+/5 Triceps, 4/5 Wrist Ext, 4-/5 Grip LUE: 4/5 Deltoid, 4+/5 Biceps, 4+/5 Triceps, 4/5 Wrist Ext, 4-/5 Grip RLE: HF 5/5, KE 5/5, ADF 5/5, APF 5/5 LLE: HF 5/5, KE 5/5, ADF 5/5, APF 5/5 Sensory exam normal for light touch and pain in all 4 limbs. No limb ataxia or cerebellar signs. No abnormal tone appreciated.  No abnormal tone noted Musculoskeletal:  OA changed in b/l hands, heberden's nodes vs bouchard's nodes noted Pt points to coccyx as are where she has occasional pain Minimal L spine and C spine TTP Minimal Shoulder TTP b/l  Minimal Knee TTP b/l Ambulating without a cane       Assessment & Plan:  1) Bilateral Hand Osteoarthritis 2) Chronic Pain syndrome 3) Knee OA s/p L TKA 4) Coccyx pain 5) CKD hx, caution with nephrotoxic medications  -Discussed foods that can be helpful for pain -TENS may be additional tool for joint pain -Continue use of her current arthritis cream as she reports this is  helping -Continue tramadol  50mg  BID PRN, pending UDS -Opiod Risk Tool low -Pain agreement today -Advised to call when 7-10 days away from running out of current tramadol  -Consider trying duloxetine    07/11/23 called pt, medication ordered as above

## 2023-06-28 ENCOUNTER — Encounter: Payer: Self-pay | Admitting: Physical Medicine & Rehabilitation

## 2023-07-02 LAB — DRUG TOX MONITOR 1 W/CONF, ORAL FLD
Amphetamines: NEGATIVE ng/mL (ref <10)
Barbiturates: NEGATIVE ng/mL (ref <10)
Benzodiazepines: NEGATIVE ng/mL (ref <0.50)
Buprenorphine: NEGATIVE ng/mL (ref <0.10)
Cocaine: NEGATIVE ng/mL (ref <5.0)
Fentanyl: NEGATIVE ng/mL (ref <0.10)
Fentanyl: NEGATIVE ng/mL (ref <0.10)
Heroin Metabolite: NEGATIVE ng/mL (ref <1.0)
MARIJUANA: NEGATIVE ng/mL (ref <2.5)
MDMA: NEGATIVE ng/mL (ref <10)
Meprobamate: NEGATIVE ng/mL (ref <2.5)
Methadone: NEGATIVE ng/mL (ref <5.0)
Nicotine Metabolite: NEGATIVE ng/mL (ref <5.0)
Opiates: NEGATIVE ng/mL (ref <2.5)
Phencyclidine: NEGATIVE ng/mL (ref <10)
Tapentadol: NEGATIVE ng/mL (ref <5.0)
Tramadol: 328.9 ng/mL — ABNORMAL HIGH (ref <5.0)
Tramadol: POSITIVE ng/mL — AB (ref <5.0)
Zolpidem: NEGATIVE ng/mL (ref <5.0)

## 2023-07-02 LAB — DRUG TOX ALC METAB W/CON, ORAL FLD: Alcohol Metabolite: NEGATIVE ng/mL (ref <25)

## 2023-07-04 ENCOUNTER — Other Ambulatory Visit: Payer: Medicare Other

## 2023-07-11 ENCOUNTER — Telehealth: Payer: Self-pay

## 2023-07-11 ENCOUNTER — Ambulatory Visit: Payer: Medicare Other | Admitting: Internal Medicine

## 2023-07-11 MED ORDER — TRAMADOL HCL 50 MG PO TABS: 50.0000 mg | ORAL_TABLET | Freq: Two times a day (BID) | ORAL | 0 refills | Status: DC | PRN

## 2023-07-11 NOTE — Telephone Encounter (Signed)
 Patient called stating that she was seen on 06/27/23 as a new patient and was told to call when she was running out of the last script from PCP. She states she will be out on 07/19/23. [facility]

## 2023-08-06 ENCOUNTER — Ambulatory Visit: Payer: Medicare Other | Admitting: Internal Medicine

## 2023-09-03 ENCOUNTER — Other Ambulatory Visit

## 2023-09-03 DIAGNOSIS — E039 Hypothyroidism, unspecified: Secondary | ICD-10-CM

## 2023-09-03 DIAGNOSIS — G8929 Other chronic pain: Secondary | ICD-10-CM

## 2023-09-03 DIAGNOSIS — E119 Type 2 diabetes mellitus without complications: Secondary | ICD-10-CM

## 2023-09-03 DIAGNOSIS — M19041 Primary osteoarthritis, right hand: Secondary | ICD-10-CM

## 2023-09-03 DIAGNOSIS — N182 Chronic kidney disease, stage 2 (mild): Secondary | ICD-10-CM

## 2023-09-05 ENCOUNTER — Encounter: Payer: Self-pay | Admitting: Internal Medicine

## 2023-09-05 ENCOUNTER — Ambulatory Visit: Admitting: Internal Medicine

## 2023-09-05 VITALS — BP 160/80 | HR 80 | Ht 65.0 in | Wt 169.0 lb

## 2023-09-05 DIAGNOSIS — E119 Type 2 diabetes mellitus without complications: Secondary | ICD-10-CM

## 2023-09-05 DIAGNOSIS — G4700 Insomnia, unspecified: Secondary | ICD-10-CM

## 2023-09-05 DIAGNOSIS — E039 Hypothyroidism, unspecified: Secondary | ICD-10-CM

## 2023-09-05 DIAGNOSIS — I10 Essential (primary) hypertension: Secondary | ICD-10-CM | POA: Diagnosis not present

## 2023-09-05 DIAGNOSIS — M1990 Unspecified osteoarthritis, unspecified site: Secondary | ICD-10-CM

## 2023-09-05 DIAGNOSIS — E1122 Type 2 diabetes mellitus with diabetic chronic kidney disease: Secondary | ICD-10-CM

## 2023-09-05 DIAGNOSIS — Z96652 Presence of left artificial knee joint: Secondary | ICD-10-CM

## 2023-09-05 DIAGNOSIS — Z7984 Long term (current) use of oral hypoglycemic drugs: Secondary | ICD-10-CM

## 2023-09-05 DIAGNOSIS — N182 Chronic kidney disease, stage 2 (mild): Secondary | ICD-10-CM | POA: Diagnosis not present

## 2023-09-05 DIAGNOSIS — G8929 Other chronic pain: Secondary | ICD-10-CM

## 2023-09-05 DIAGNOSIS — M7918 Myalgia, other site: Secondary | ICD-10-CM

## 2023-09-05 MED ORDER — ALPRAZOLAM 0.25 MG PO TABS: ORAL_TABLET | ORAL | 1 refills | Status: AC

## 2023-09-05 NOTE — Progress Notes (Signed)
 Patient Care Team: Perri Ronal PARAS, MD as PCP - General (Internal Medicine) Alvan Ronal BRAVO, MD (Inactive) as PCP - Cardiology (Cardiology) Leslee Reusing, MD as Consulting Physician (Ophthalmology)  Visit Date: 09/05/23  Subjective:   Chief Complaint  Patient presents with   Pain Management   Muscle Pain   Vitals:   09/05/23 1023 09/05/23 1036 09/05/23 1045  BP: (!) 160/80 (!) 160/80 (!) 160/80   Patient PI:Sharon Peters,Female DOB:08-15-1940,83 y.o. FMW:[id number]   83 y.o.Female presents today for 3 months follow-up last seen on 05/14/2023. Patient has a past medical history of HTN; Hypothyroidism; DM II; Arthritis/Osteoarthritis, Both Hands; CKD II; Insomnia.   Chronic musculoskeletal pain  is currently managed with Tramadol  50 mg q12 hours as needed due to her inability to take NSAIDS w/ her hx of CKD. Was referred to Dr. Urbano for chronic pain management of arthritis pain and he saw her for the first time in June.. Receiving Tramadol  prescriptions through Dr. Urbano. Has been prescribed Tramadol  q 12 hours. Urine drug screen done August 19. Results reviewed.  History of  Hypertension treated with Amlodipine  10 mg daily. Blood Pressure: elevated today at 160/80 consecutively 3x. She says that her at-home blood pressures have been normal and gives recent readings  of 122/61 and 125/60.  History of  mild pure hypercholesterolemia with 09/03/2023 Lipid Panel revealing LDL 104; otherwise normal.  History of Diabetes Mellitus, type II treated with Metformin  500 mg daily and Farxiga 5 mg daily.  09/03/2023 HgbA1C is 6.1% .   Eye exam 04/15/2023 at [facility] did not detect diabetic retinopathy. Her weight has remained fairly stable around 168 lbs since 12/2021.  History of Chronic Kidney Disease, stage II followed by [facility], whom she last saw 11/29/2022.  History of Insomnia managed with Ambien  5 mg at bedtime refilled for 90 days June  24,2024  Past Medical History:  Diagnosis Date   Chest discomfort    Nuclear, December, 2008, normal   Diabetes mellitus type II    Ejection fraction    EF 65%, February, 2010, trivial pericardial effusion,   HTN (hypertension)    Hypothyroidism    Obesity    Pericardial effusion    Trivial, echo, February, 2011  /     small...int he past...unexplained...normal sedimentation rate and ANA   Psoriasis    Squamous cell carcinoma, leg, 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    Allergies  Allergen Reactions   Celecoxib Swelling    Eyes swell shut   Immunization History  Administered Date(s) Administered   Fluad Quad(high Dose 65+) 10/07/2018, 11/01/2021   Influenza Inj Mdck Quad Pf 10/15/2018   Influenza Split 10/31/2010, 10/03/2011   Influenza Whole 12/02/2009   Influenza,inj,Quad PF,6+ Mos 09/30/2012, 12/15/2013, 11/11/2014, 11/28/2015, 10/04/2016, 10/08/2017   Influenza-Unspecified 11/17/2019, 11/02/2020, 12/07/2022   PFIZER(Purple Top)SARS-COV-2 Vaccination 05/19/2019, 06/05/2019   Pneumococcal Conjugate-13 06/15/2014   Pneumococcal Polysaccharide-23 12/02/2009   Tdap 01/16/2003, 05/05/2013, 05/14/2023   Zoster, Live 11/24/2009   Past Surgical History:  Procedure Laterality Date   left arthroscopic knee surgery     REPLACEMENT TOTAL KNEE Left 05/25/2021   squamous cell carcinoma remo     05/2022   TUBAL LIGATION      Family History  Problem Relation Age of Onset   Cancer Mother    Cancer Father    Breast cancer Brother    Diabetes Other        2 siblings   Cancer Other    Social Hx: Previously worked for  [employer] and is now retired. Does not smoke. Rare alcohol consumption. Resides alone. Divorced. 2 adult children, a son and a daughter.  Family Yk:Amnuyzm with hx of liver cancer and history of Hepatitis B infection. Father died at age 31 due to pancreatic cancer. Mother died at age 98 of lung cancer. 3 brothers and 1 sister.  Review of Systems  Constitutional:  Negative for  fever and malaise/fatigue.  HENT:  Negative for congestion.   Eyes:  Negative for blurred vision.  Respiratory:  Negative for cough and shortness of breath.   Cardiovascular:  Negative for chest pain, palpitations and leg swelling.  Gastrointestinal:  Negative for vomiting.  Musculoskeletal:  Negative for back pain.  Skin:  Negative for rash.  Neurological:  Negative for loss of consciousness and headaches.     Objective:  Vitals: BP (!) 160/80 (BP Location: Left Arm, Patient Position: Sitting, Cuff Size: Normal)   Pulse 80   Ht 5' 5 (1.651 m)   Wt 169 lb (76.7 kg)   SpO2 98%   BMI 28.12 kg/m   Physical Exam Vitals and nursing note reviewed.  Constitutional:      General: She is not in acute distress.    Appearance: Normal appearance. She is not toxic-appearing.  HENT:     Head: Normocephalic and atraumatic.  Pulmonary:     Effort: Pulmonary effort is normal.  Skin:    General: Skin is warm and dry.  Neurological:     Mental Status: She is alert and oriented to person, place, and time. Mental status is at baseline.  Psychiatric:        Mood and Affect: Mood normal.        Behavior: Behavior normal.        Thought Content: Thought content normal.        Judgment: Judgment normal.     Results:  Studies Obtained And Personally Reviewed By Me: Labs:  CBC w/ Differential Lab Results  Component Value Date   WBC 5.1 05/14/2023   RBC 4.88 05/14/2023   HGB 14.2 05/14/2023   HCT 42.2 05/14/2023   PLT 246 05/14/2023   MCV 86.5 05/14/2023   MCH 29.1 05/14/2023   MCHC 33.6 05/14/2023   RDW 12.6 05/14/2023   MPV 11.0 05/14/2023   LYMPHSABS 1,242 12/26/2021   MONOABS 506 11/25/2015   BASOSABS 31 05/14/2023    Comprehensive Metabolic Panel Lab Results  Component Value Date   NA 137 01/03/2023   K 4.3 01/03/2023   CL 103 01/03/2023   CO2 25 01/03/2023   GLUCOSE 98 01/03/2023   BUN 20 01/03/2023   CREATININE 0.96 (H) 01/03/2023   CALCIUM 9.6 01/03/2023   PROT 7.3  09/03/2023   ALBUMIN 4.5 11/25/2015   AST 20 09/03/2023   ALT 13 09/03/2023   ALKPHOS 51 11/25/2015   BILITOT 0.6 09/03/2023   EGFR 59 (L) 01/03/2023   GFRNONAA 52 (L) 06/28/2020   Lipid Panel  Lab Results  Component Value Date   CHOL 193 09/03/2023   HDL 69 09/03/2023   LDLCALC 104 (H) 09/03/2023   TRIG 104 09/03/2023   A1c Lab Results  Component Value Date   HGBA1C 6.1 (H) 09/03/2023    TSH Lab Results  Component Value Date   TSH 2.38 09/03/2023   Hepatic Function Panel    Latest Ref Rng & Units 09/03/2023   12:00 AM  Hepatic Function  Total Protein 6.1 - 8.1 g/dL 7.3   AST 10 - 35  U/L 20   ALT 6 - 29 U/L 13   Total Bilirubin 0.2 - 1.2 mg/dL 0.6   Bilirubin, Direct 0.0 - 0.2 mg/dL 0.1    Drug Monitoring 09/03/2023  Alcohol Metabolites     <500 ng/mL NEGATIVE   Amphetamines     <500 ng/mL NEGATIVE   Benzodiazepines     <100 ng/mL NEGATIVE   Buprenorphine, Urine     <5 ng/mL NEGATIVE   Cocaine Metabolite     <150 ng/mL NEGATIVE   6 Acetylmorphine     <10 ng/mL NEGATIVE   Marijuana Metabolite     <20 ng/mL NEGATIVE   MDMA     <500 ng/mL NEGATIVE   Opiates     <100 ng/mL NEGATIVE   OXYCODONE     <100 ng/mL NEGATIVE   Creatinine     > or = 20.0 mg/dL 04.4   pH     4.5 - 9.0  6.4   Oxidant     <200 mcg/mL NEGATIVE    Assessment & Plan:   Orders Placed This Encounter  Procedures   Basic Metabolic Panel   Meds ordered this encounter  Medications   ALPRAZolam  (XANAX ) 0.25 MG tablet    Sig: One tablet by mouth daily as needed for anxiety    Dispense:  30 tablet    Refill:  1   Osteoarthritis; Musculoskeletal Pain managed with Tramadol  50 mg q12 hours as needed due to her inability to take NSAIDS w/ her hx of CKD, but she has since been referred to Dr. Murray Collier who she last saw 06/27/2023 with a 62-month follow up.   Hypertension treated with Amlodipine  10 mg daily. Blood Pressure: elevated today at 160/80 consecutively 3x. She says that her  at-home blood pressures have been WNL with examples of 122/61 and 125/60.  Likely an anxiety response, her blood pressure are consistently elevated in office but reportedly normal at home. PDMP reviewed: sending in Xanax  0.25 mg to take as needed and instructed to take 1 hour before future office visits.   Hyperlipidemia with 09/03/2023 Lipid Panel: LDL 104; otherwise WNL.   Diabetes Mellitus, type II treated with Metformin  500 mg daily and Farxiga 5 mg daily.  09/03/2023 HgbA1c 6.1 and Albumin WNL at 0.6. Eye exam 04/15/2023 at [facility] did not detect diabetic retinopathy. Her weight has remained fairly stable around 168 since 12/2021.  Chronic Kidney Disease, stage II followed by [facility], who she last saw 11/29/2022.  Ordered B-MET per patient request, will inform her of results when available.   Insomnia managed with Ambien  5 mg as needed.    I,Emily Lagle,acting as a Neurosurgeon for Ronal JINNY Hailstone, MD.,have documented all relevant documentation on the behalf of Ronal JINNY Hailstone, MD,as directed by  Ronal JINNY Hailstone, MD while in the presence of Ronal JINNY Hailstone, MD.  I, Ronal JINNY Hailstone, MD, have reviewed all documentation for this visit. The documentation on 09/05/2023 for the exam, diagnosis, procedures, and orders are all accurate and complete.

## 2023-09-05 NOTE — Patient Instructions (Addendum)
 Labs are stable. Hgb AIC is 6.1%. Basic metabolic panel drawn today. Results will be called to patient and are available to Dr. Emerick in [facility]. Return in 6 months for Medicare wellness and health maintenance exam with fasting labs.

## 2023-09-06 ENCOUNTER — Ambulatory Visit: Payer: Self-pay | Admitting: Internal Medicine

## 2023-09-06 LAB — BASIC METABOLIC PANEL WITH GFR
BUN/Creatinine Ratio: 17 (calc) (ref 6–22)
BUN: 17 mg/dL (ref 7–25)
CO2: 21 mmol/L (ref 20–32)
Calcium: 9.9 mg/dL (ref 8.6–10.4)
Chloride: 102 mmol/L (ref 98–110)
Creat: 1.02 mg/dL — ABNORMAL HIGH (ref 0.60–0.95)
Glucose, Bld: 101 mg/dL — ABNORMAL HIGH (ref 65–99)
Potassium: 4.5 mmol/L (ref 3.5–5.3)
Sodium: 138 mmol/L (ref 135–146)
eGFR: 55 mL/min/1.73m2 — ABNORMAL LOW (ref >=60)

## 2023-09-06 LAB — DRUG MONITORING, PANEL 8 WITH CONFIRMATION, URINE
6 Acetylmorphine: NEGATIVE ng/mL (ref <10)
Alcohol Metabolites: NEGATIVE ng/mL (ref <500)
Amphetamines: NEGATIVE ng/mL (ref <500)
Benzodiazepines: NEGATIVE ng/mL (ref <100)
Buprenorphine, Urine: NEGATIVE ng/mL (ref <5)
Cocaine Metabolite: NEGATIVE ng/mL (ref <150)
Creatinine: 95.5 mg/dL (ref >=20.0)
MDMA: NEGATIVE ng/mL (ref <500)
Marijuana Metabolite: NEGATIVE ng/mL (ref <20)
Opiates: NEGATIVE ng/mL (ref <100)
Oxidant: NEGATIVE ug/mL (ref <200)
Oxycodone: NEGATIVE ng/mL (ref <100)
pH: 6.4 (ref 4.5–9.0)

## 2023-09-06 LAB — HEMOGLOBIN A1C
Hgb A1c MFr Bld: 6.1 % — ABNORMAL HIGH (ref <5.7)
Mean Plasma Glucose: 128 mg/dL
eAG (mmol/L): 7.1 mmol/L

## 2023-09-06 LAB — TEST AUTHORIZATION

## 2023-09-06 LAB — HEPATIC FUNCTION PANEL
AG Ratio: 1.7 (calc) (ref 1.0–2.5)
ALT: 13 U/L (ref 6–29)
AST: 20 U/L (ref 10–35)
Albumin: 4.6 g/dL (ref 3.6–5.1)
Alkaline phosphatase (APISO): 60 U/L (ref 37–153)
Bilirubin, Direct: 0.1 mg/dL (ref 0.0–0.2)
Globulin: 2.7 g/dL (ref 1.9–3.7)
Indirect Bilirubin: 0.5 mg/dL (ref 0.2–1.2)
Total Bilirubin: 0.6 mg/dL (ref 0.2–1.2)
Total Protein: 7.3 g/dL (ref 6.1–8.1)

## 2023-09-06 LAB — DM TEMPLATE

## 2023-09-06 LAB — LIPID PANEL
Cholesterol: 193 mg/dL (ref <200)
HDL: 69 mg/dL (ref >=50)
LDL Cholesterol (Calc): 104 mg/dL — ABNORMAL HIGH
Non-HDL Cholesterol (Calc): 124 mg/dL (ref <130)
Total CHOL/HDL Ratio: 2.8 (calc) (ref <5.0)
Triglycerides: 104 mg/dL (ref <150)

## 2023-09-06 LAB — TSH: TSH: 2.38 m[IU]/L (ref 0.40–4.50)

## 2023-09-06 LAB — MICROALBUMIN / CREATININE URINE RATIO
Creatinine, Urine: 100 mg/dL (ref 20–275)
Microalb Creat Ratio: 6 mg/g{creat} (ref <30)
Microalb, Ur: 0.6 mg/dL

## 2023-09-27 ENCOUNTER — Encounter: Payer: Self-pay | Admitting: Physical Medicine & Rehabilitation

## 2023-09-27 ENCOUNTER — Encounter: Attending: Physical Medicine & Rehabilitation | Admitting: Physical Medicine & Rehabilitation

## 2023-09-27 VITALS — BP 163/78 | HR 78 | Ht 65.0 in | Wt 172.0 lb

## 2023-09-27 DIAGNOSIS — Z79891 Long term (current) use of opiate analgesic: Secondary | ICD-10-CM | POA: Insufficient documentation

## 2023-09-27 DIAGNOSIS — M19042 Primary osteoarthritis, left hand: Secondary | ICD-10-CM | POA: Diagnosis present

## 2023-09-27 DIAGNOSIS — M19041 Primary osteoarthritis, right hand: Secondary | ICD-10-CM | POA: Diagnosis present

## 2023-09-27 DIAGNOSIS — M533 Sacrococcygeal disorders, not elsewhere classified: Secondary | ICD-10-CM | POA: Insufficient documentation

## 2023-09-27 DIAGNOSIS — G894 Chronic pain syndrome: Secondary | ICD-10-CM | POA: Insufficient documentation

## 2023-09-27 MED ORDER — TRAMADOL HCL 50 MG PO TABS: 50.0000 mg | ORAL_TABLET | Freq: Two times a day (BID) | ORAL | 4 refills | Status: AC | PRN

## 2023-09-27 NOTE — Progress Notes (Signed)
 Subjective:    Patient ID: Sharon Peters, female    DOB: Aug 03, 1940, 83 y.o.   MRN: [id number]  HPI   Sharon Peters is a 83 y.o. 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   They are presenting to PM&R clinic as a new patient for pain management evaluation. They were referred by Dr. Ronal Hailstone for treatment of b/l hand pain.   Thank you for referral of this very pleasant patient!  She has had hand pain for many years. Pain is a little worse in her R hand than left hand.  Activity worsens her pain. She has a lot of stiffness in her hands with activities.  She was on diclofenac  oral in the past until she developed CKD and this was stopped.  Pain severity varies throughout the day.  She is having trouble opening buttons with her hands and doing activities with her hands.  She is using arthritis gel, she has had prior use of voltaren  gel.  She was Rheumatology several years ago and pt says she was restarted on diclofenac  gel and advised f/u with PCP. Pt reports she uses tramadol  usually once a day and this is helping her keep her pain controlled. She would like to continue this medication for when he pain is severe.   She has had tailbone pain that started after prior left TKA. She has also had issues with L shoulder pain after and injury. She completed pt for her tailbone pain.   Red flag symptoms: No red flags for back pain endorsed in Hx or ROS  Medications tried: Topical medications arthritis gel, voltaren  gel Nsaids  stopped due to CKD Tylenol   - mild benefit Opiates  - tramadol - helps her pain  Gabapentin / Lyrica- Denies  TCAs  - Denies  SNRIs - Denies  Other - Denies  Other treatments: PT- for after TKA and to improve balance TENs unit - Denies  Injections - knee injections  in the past for OA  Surgery - TKA left  Prior UDS results: No results found for: LABOPIA, COCAINSCRNUR, LABBENZ, AMPHETMU, THCU, LABBARB   Interval History 09/27/23 Follow-up for chronic pain medication management. Reports doing well. Pain is well-controlled with Tramadol . Ran low on medication and was halving pills, but notes a whole tablet is more effective. Takes Tramadol  once daily, which is sufficient. Does not wish to increase the dose. Denies any side effects from the medication.  Pain remains in the hands, left knee, and lower back/tailbone area. - Tailbone pain is significantly improved. Driving was previously an exacerbating factor, but this is much better. Has a cushion but does not need to use it. - Left knee pain is improved  - Hand pain is present. Reports some tenderness to touch but is manageable overall.  - Reports no significant back pain currently.  Continues to be active.  Pain Inventory Average Pain 6 Pain Right Now 1 My pain is N/A In the last 24 hours, has pain interfered with the following? General activity 7-8 Relation with others 10 Enjoyment of life 10 What TIME of day is your pain at its worst? morning , daytime, evening, night, and varies Sleep (in general) Fair  Pain is worse with: some activites Pain improves with: medication Relief from Meds: 7  walk without assistance ability to climb steps?  yes do  you drive?  yes  retired  No problems in this area  Any changes since last visit?  no  Any changes since last visit?  no    Family History  Problem Relation Age of Onset   Cancer Mother    Cancer Father    Breast cancer Brother    Diabetes Other        2 siblings   Cancer Other    Social History   Socioeconomic History   Marital status: Divorced    Spouse name: Not on file   Number of children: Not on file   Years of education: Not on file   Highest education level: 12th grade  Occupational History   Occupation:  retired  Tobacco Use   Smoking status: Never   Smokeless tobacco: Never  Vaping Use   Vaping status: Never Used  Substance and Sexual Activity   Alcohol use: Yes    Comment: rarely   Drug use: Not on file   Sexual activity: Not on file  Other Topics Concern   Not on file  Social History Narrative   Not on file   Social Drivers of Health   Financial Resource Strain: Low Risk  (01/07/2023)   Overall Financial Resource Strain (CARDIA)    Difficulty of Paying Living Expenses: Not hard at all  Food Insecurity: No Food Insecurity (01/07/2023)   Hunger Vital Sign    Worried About Running Out of Food in the Last Year: Never true    Ran Out of Food in the Last Year: Never true  Transportation Needs: No Transportation Needs (01/07/2023)   PRAPARE - Administrator, Civil Service (Medical): No    Lack of Transportation (Non-Medical): No  Physical Activity: Insufficiently Active (01/07/2023)   Exercise Vital Sign    Days of Exercise per Week: 4 days    Minutes of Exercise per Session: 30 min  Stress: No Stress Concern Present (01/07/2023)   [employer] of Occupational Health - Occupational Stress Questionnaire    Feeling of Stress : Not at all  Social Connections: Moderately Integrated (01/07/2023)   Social Connection and Isolation Panel    Frequency of Communication with Friends and Family: More than three times a week    Frequency of Social Gatherings with Friends and Family: Three times a week    Attends Religious Services: 1 to 4 times per year    Active Member of Clubs or Organizations: Yes    Attends Banker Meetings: 1 to 4 times per year    Marital Status: Divorced   Past Surgical History:  Procedure Laterality Date   left arthroscopic knee surgery     REPLACEMENT TOTAL KNEE Left 05/25/2021   squamous cell carcinoma remo     05/2022   TUBAL LIGATION     Past Medical History:  Diagnosis Date   Chest discomfort    Nuclear, December, 2008,  normal   Diabetes mellitus type II    Ejection fraction    EF 65%, February, 2010, trivial pericardial effusion,   HTN (hypertension)    Hypothyroidism    Obesity    Pericardial effusion    Trivial, echo, February, 2011  /     small...int he past...unexplained...normal sedimentation rate and ANA   Psoriasis    Squamous cell carcinoma, leg, 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   BP (!) 163/78 (BP Location: Left Arm, Patient Position: Sitting, Cuff Size: Normal)   Pulse 78   Ht 5' 5 (1.651 m)  Wt 172 lb (78 kg)   SpO2 95%   BMI 28.62 kg/m   Opioid Risk Score:   Fall Risk Score:  `1  Depression screen PHQ 2/9     09/27/2023    1:10 PM 06/27/2023    2:37 PM 05/14/2023   10:28 AM 01/07/2023   11:03 AM 01/02/2022    9:58 AM 05/09/2021   12:01 PM 12/29/2020   11:08 AM  Depression screen PHQ 2/9  Decreased Interest 0 0 0 0 0 0 0  Down, Depressed, Hopeless 0 0 0 0 0 0 0  PHQ - 2 Score 0 0 0 0 0 0 0  Altered sleeping      0 0  Tired, decreased energy      0 0  Change in appetite      0 0  Feeling bad or failure about yourself       0 0  Trouble concentrating      0 0  Moving slowly or fidgety/restless      0 0  Suicidal thoughts      0 0  PHQ-9 Score      0 0  Difficult doing work/chores      Not difficult at all Not difficult at all     Review of Systems  Musculoskeletal:  Positive for arthralgias.       Bilateral osteoarthritis of both knees  All other systems reviewed and are negative.      Objective:   Physical Exam Gen: no distress, normal appearing HEENT: oral mucosa pink and moist, NCAT Chest: normal effort, normal rate of breathing Abd: soft, non-distended Ext: no edema Psych: very pleasant, normal affect Skin: intact Neuro: Alert and awake, follows commands, cranial nerves II through XII grossly intact, normal speech and language Moving all 4 extremities to gravity and resistance Sensory exam normal for light touch  No limb ataxia or cerebellar signs. No abnormal tone  appreciated.   Musculoskeletal:  OA changed in b/l hands, heberden's nodes / bouchard's nodes noted Minimal coccyx TTP  Prior exam Minimal L spine and C spine TTP Minimal Shoulder TTP b/l  Minimal Knee TTP b/l Ambulating without a cane       Assessment & Plan:  1) Bilateral Hand Osteoarthritis 2) Chronic Pain syndrome 3) Knee OA s/p L TKA 4) Coccyx pain 5) CKD hx, caution with nephrotoxic medications  -Discussed foods that can be helpful for pain -TENS may be additional tool for joint pain -Continue use of her current arthritis cream as she reports this is helping -Continue tramadol  50mg  BID PRN -Opiod Risk Tool low --Continue UDS and pill counts.  Continue PDMP monitoring.  Pain contract completed prior visit. -Discussed bringing pill bottle with any medications even if empty to all appointments -Consider trying duloxetine

## 2023-10-07 ENCOUNTER — Ambulatory Visit (INDEPENDENT_AMBULATORY_CARE_PROVIDER_SITE_OTHER): Admitting: Internal Medicine

## 2023-10-07 ENCOUNTER — Ambulatory Visit

## 2023-10-07 VITALS — BP 160/80 | Temp 98.3°F | Ht 65.0 in | Wt 172.0 lb

## 2023-10-07 DIAGNOSIS — N182 Chronic kidney disease, stage 2 (mild): Secondary | ICD-10-CM

## 2023-10-07 DIAGNOSIS — I129 Hypertensive chronic kidney disease with stage 1 through stage 4 chronic kidney disease, or unspecified chronic kidney disease: Secondary | ICD-10-CM | POA: Diagnosis not present

## 2023-10-07 DIAGNOSIS — E1122 Type 2 diabetes mellitus with diabetic chronic kidney disease: Secondary | ICD-10-CM

## 2023-10-07 DIAGNOSIS — J069 Acute upper respiratory infection, unspecified: Secondary | ICD-10-CM

## 2023-10-07 DIAGNOSIS — R0981 Nasal congestion: Secondary | ICD-10-CM

## 2023-10-07 DIAGNOSIS — E119 Type 2 diabetes mellitus without complications: Secondary | ICD-10-CM

## 2023-10-07 DIAGNOSIS — I10 Essential (primary) hypertension: Secondary | ICD-10-CM

## 2023-10-07 DIAGNOSIS — Z7984 Long term (current) use of oral hypoglycemic drugs: Secondary | ICD-10-CM

## 2023-10-07 LAB — POC COVID19/FLU A&B COMBO
Covid Antigen, POC: NEGATIVE
Influenza A Antigen, POC: NEGATIVE
Influenza B Antigen, POC: NEGATIVE

## 2023-10-07 MED ORDER — AZITHROMYCIN 250 MG PO TABS
ORAL_TABLET | ORAL | 0 refills | Status: AC
Start: 2023-10-07 — End: 2023-10-12

## 2023-10-07 MED ORDER — FLUCONAZOLE 150 MG PO TABS: 150.0000 mg | ORAL_TABLET | Freq: Once | ORAL | 0 refills | Status: AC

## 2023-10-07 NOTE — Progress Notes (Signed)
 Patient Care Team: Perri Ronal PARAS, MD as PCP - General (Internal Medicine) Alvan Ronal BRAVO, MD (Inactive) as PCP - Cardiology (Cardiology) Leslee Reusing, MD as Consulting Physician (Ophthalmology)  Visit Date: 10/07/23  Subjective:    Patient ID: Sharon Peters , Female   DOB: March 02, 1940, 83 y.o.    MRN: [id number]   83 y.o. Female presents today for sick visit for sinus congestion. Patient has a past medical history of Hypertension, Hypercholesteremia, Diabetes mellitus.   She said she started to experience sinus congestion yesterday. She said she has not had a fever but had chills yesterday. No chills today. She was recently around one of her grandchildren who had a runny nose. She denies frank sore throat but she says throat feels irritated. Covid-19 and influenza tests were negative today here in the office.   History of Hypertension treated with Amlodipine  10 mg. Blood pressure today is elevated at at 160/80. She should continue to monitor BP at home.  Vaccine counseling: Influenza, Covid-19 and RSV vaccines discussed.    Past Medical History:  Diagnosis Date   Chest discomfort    Nuclear, December, 2008, normal   Diabetes mellitus type II    Ejection fraction    EF 65%, February, 2010, trivial pericardial effusion,   HTN (hypertension)    Hypothyroidism    Obesity    Pericardial effusion    Trivial, echo, February, 2011  /     small...int he past...unexplained...normal sedimentation rate and ANA   Psoriasis    Squamous cell carcinoma, leg, 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     Family History  Problem Relation Age of Onset   Cancer Mother    Cancer Father    Breast cancer Brother    Diabetes Other        2 siblings   Cancer Other      Social Hx: Previously worked for [employer] and is now retired. Does not smoke. Rare alcohol consumption. Resides alone. Divorced. 2 adult children, a son and a daughter.   Family Yk:Amnuyzm with hx of liver cancer and history of  Hepatitis B infection. Father died at age 32 due to pancreatic cancer. Mother died at age 37 of lung cancer. 3 brothers and 1 sister.    Review of Systems  Constitutional:  Negative for chills and fever.  HENT:  Positive for congestion.   Respiratory:  Negative for cough and sputum production.         Objective:   Vitals: BP (!) 160/80 (BP Location: Left Arm, Patient Position: Sitting)   Temp 98.3 F (36.8 C)   Ht 5' 5 (1.651 m)   Wt 172 lb (78 kg)   SpO2 97%   BMI 28.62 kg/m    Physical Exam Vitals and nursing note reviewed.  Constitutional:      General: She is not in acute distress.    Appearance: Normal appearance. She is not ill-appearing.  HENT:     Head: Normocephalic and atraumatic.     Right Ear: Tympanic membrane, ear canal and external ear normal.     Left Ear: Tympanic membrane, ear canal and external ear normal.     Mouth/Throat:     Mouth: Mucous membranes are moist.     Pharynx: Oropharynx is clear. No oropharyngeal exudate or posterior oropharyngeal erythema.  Pulmonary:     Effort: Pulmonary effort is normal.     Breath sounds: Normal breath sounds. No wheezing, rhonchi or rales.  Lymphadenopathy:     Cervical:  No cervical adenopathy.  Skin:    General: Skin is warm and dry.  Neurological:     Mental Status: She is alert and oriented to person, place, and time. Mental status is at baseline.  Psychiatric:        Mood and Affect: Mood normal.        Behavior: Behavior normal.        Thought Content: Thought content normal.        Judgment: Judgment normal.       Results:   Studies obtained and personally reviewed by me:     Labs:       Component Value Date/Time   NA 138 09/03/2023 1003   K 4.5 09/03/2023 1003   CL 102 09/03/2023 1003   CO2 21 09/03/2023 1003   GLUCOSE 101 (H) 09/03/2023 1003   BUN 17 09/03/2023 1003   CREATININE 1.02 (H) 09/03/2023 1003   CALCIUM 9.9 09/03/2023 1003   PROT 7.3 09/03/2023 1003   ALBUMIN 4.5  11/25/2015 1039   AST 20 09/03/2023 1003   ALT 13 09/03/2023 1003   ALKPHOS 51 11/25/2015 1039   BILITOT 0.6 09/03/2023 1003   GFRNONAA 52 (L) 06/28/2020 0908   GFRAA 60 06/28/2020 0908     Lab Results  Component Value Date   WBC 5.1 05/14/2023   HGB 14.2 05/14/2023   HCT 42.2 05/14/2023   MCV 86.5 05/14/2023   PLT 246 05/14/2023    Lab Results  Component Value Date   CHOL 193 09/03/2023   HDL 69 09/03/2023   LDLCALC 104 (H) 09/03/2023   TRIG 104 09/03/2023   CHOLHDL 2.8 09/03/2023    Lab Results  Component Value Date   HGBA1C 6.1 (H) 09/03/2023     Lab Results  Component Value Date   TSH 2.38 09/03/2023           Assessment & Plan:    Acute Upper Respiratory Infection: She said she started to experience sinus congestion yesterday. She said she doesn't have a fever but had chills yesterday but the chills have since subsided. She was recently around one of her grandchildren who had a runny nose. She denies her throat being sore but she says it feels irritated. Covid-19 and influenza tests were negative.  Azithromycin  250 mg 2 tablets on day one and 1 tablet on days 2 through 5 and Diflucan  150 mg by mouth once were prescribed.   Hypertension: treated with Amlodipine  10 mg. Blood pressure today is elevated at 160/80. I think elevation is due to anxiety with visit. Continue to monitor at home. Call us  if BP is persistently elevated at home. Please check BP 3 times weekly. BP was 140/70 at [facility] in November 2024.  Vaccine counseling: Influenza, Covid-19 and RSV vaccines discussed.   Chronic kidney disease stage 2 followed by [facility]  Type 2 diabetes mellitus- Hgb AIC was 6.1% in August. Is on Farxiga and metfromin  Hypothyroidism treated with thyroid  replacement therapy    I,Sharon Peters,acting as a scribe for Ronal JINNY Hailstone, MD.,have documented all relevant documentation on the behalf of Ronal JINNY Hailstone, MD,as directed by  Ronal JINNY Hailstone, MD while in the presence of Ronal JINNY Hailstone, MD.

## 2023-10-07 NOTE — Patient Instructions (Signed)
 Patient has chronic kidney disease. Exposed to 83 year old with respiratory infections. Discussed vaccines today as well. Flu and Covid tests were negative today. She wants an antibiltic prescribed. Have sent in Zithromax  Z-pak and Diflucan . Take Diflucan  if develop yeast infection while on antibiotics, Rest and stay well hydrated. Vaccines were discussed with her today.

## 2023-10-08 ENCOUNTER — Encounter: Payer: Self-pay | Admitting: Internal Medicine

## 2023-10-16 ENCOUNTER — Other Ambulatory Visit: Payer: Self-pay | Admitting: Internal Medicine

## 2023-11-04 NOTE — Progress Notes (Addendum)
 Patient Care Team: Perri Ronal PARAS, MD as PCP - General (Internal Medicine) Alvan Ronal BRAVO, MD (Inactive) as PCP - Cardiology (Cardiology) Leslee Reusing, MD as Consulting Physician (Ophthalmology)  Visit Date: 11/05/23  Subjective:    Patient ID: Sharon Peters , Female   DOB: 04-11-1940, 83 y.o.    MRN: [id number]   83 y.o. Female presents today for Hypertension. Patient has a past medical history of Chronic pain syndrome, Hyperlipidemia, Chronic Kidney disease, Hypertension.  History of Hypertension treated with Amlodipine  10 mg daily. She says her BP ranges from 117, 123 but never higher than 130 systolic.  Blood pressure today was initially hypertensive at 160/80 and was 140/80 on recheck.   History of  Diabetes Mellitus, Type II treated with Farxiga 5 mg daily, metformin  500 mg daily. 09/03/2023 HgbA1c 6.1%.   History of chronic kidney disease which is stable. Followed at [facility] and was seen there in November 2024. Has been followed there since 2018. 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 She has an appointment with her nephrologist soon.   History of Chronic pain syndrome treated with Tramadol  50 mg daily. Seen by Dr. Redmond at [facility].     Vaccine counseling: Shingles vaccine deferred. Influenza vaccine received today.   Past Medical History:  Diagnosis Date   Chest discomfort    Nuclear, December, 2008, normal   Diabetes mellitus type II    Ejection fraction    EF 65%, February, 2010, trivial pericardial effusion,   HTN (hypertension)    Hypothyroidism    Obesity    Pericardial effusion    Trivial, echo, February, 2011  /     small...int he past...unexplained...normal sedimentation rate and ANA   Psoriasis    Squamous cell carcinoma, leg, 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     Family History  Problem Relation Age of Onset   Cancer Mother    Cancer Father    Breast cancer Brother    Diabetes Other         2 siblings   Cancer Other   Family history: Brother with history of liver cancer and history of hepatitis B infection. Father died at age 88 due to pancreatic cancer. Mother died at age 32 of lung cancer. 3 brothers and 1 sister.   Social history: Divorced and resides alone. 2 adult children, a son and a daughter. She is retired from [employer]. Does not smoke. Rare alcohol consumption.      Review of Systems  All other systems reviewed and are negative.       Objective:   Vitals: BP (!) 160/80   Pulse 80   Ht 5' 5 (1.651 m)   Wt 171 lb (77.6 kg)   SpO2 98%   BMI 28.46 kg/m    Physical Exam Vitals and nursing note reviewed.  Constitutional:      General: She is not in acute distress.    Appearance: Normal appearance. She is not toxic-appearing.  HENT:     Head: Normocephalic and atraumatic.  Neck:     Vascular: No carotid bruit.  Cardiovascular:     Rate and Rhythm: Normal rate and regular rhythm. No extrasystoles are present.    Pulses: Normal pulses.     Heart sounds: Normal heart sounds. No murmur heard.    No friction rub. No gallop.  Pulmonary:     Effort: Pulmonary effort is normal. No respiratory distress.     Breath sounds: Normal  breath sounds. No wheezing or rales.  Skin:    General: Skin is warm and dry.  Neurological:     Mental Status: She is alert and oriented to person, place, and time. Mental status is at baseline.  Psychiatric:        Mood and Affect: Mood normal.        Behavior: Behavior normal.        Thought Content: Thought content normal.        Judgment: Judgment normal.       Results:      Labs:       Component Value Date/Time   NA 138 09/03/2023 1003   K 4.5 09/03/2023 1003   CL 102 09/03/2023 1003   CO2 21 09/03/2023 1003   GLUCOSE 101 (H) 09/03/2023 1003   BUN 17 09/03/2023 1003   CREATININE 1.02 (H) 09/03/2023 1003   CALCIUM 9.9 09/03/2023 1003   PROT 7.3 09/03/2023 1003   ALBUMIN 4.5 11/25/2015 1039   AST  20 09/03/2023 1003   ALT 13 09/03/2023 1003   ALKPHOS 51 11/25/2015 1039   BILITOT 0.6 09/03/2023 1003   GFRNONAA 52 (L) 06/28/2020 0908   GFRAA 60 06/28/2020 0908     Lab Results  Component Value Date   WBC 5.1 05/14/2023   HGB 14.2 05/14/2023   HCT 42.2 05/14/2023   MCV 86.5 05/14/2023   PLT 246 05/14/2023    Lab Results  Component Value Date   CHOL 193 09/03/2023   HDL 69 09/03/2023   LDLCALC 104 (H) 09/03/2023   TRIG 104 09/03/2023   CHOLHDL 2.8 09/03/2023    Lab Results  Component Value Date   HGBA1C 6.1 (H) 09/03/2023     Lab Results  Component Value Date   TSH 2.38 09/03/2023         Assessment & Plan:   Hypertension: treated with Amlodipine  10 mg daily. She says her systolic blood pressure ranges  generally from 117-123 but never higher than 130 systolic.  Blood pressure today was initially hypertensive at 160/80 and was 140/80 on recheck.    Told to take blood pressure measurements 1-2 times a day at least three times a week and discuss readings with her Nephrologist to determine if she needs to be on more medication.   Diabetes Mellitus, Type II: treated with Farxiga 5 mg daily, metformin  500 mg daily. 09/03/2023 HgbA1c 6.1%. Treated at [facility]  Chronic kidney disease:  which is stable. Followed at [facility] and was seen there in October 2022. Has been followed there since 2018. 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  Has a appointment with her nephrologist soon.   Chronic pain syndrome: treated with Tramadol  50 mg daily. Seen by Dr. Redmond at [facility].   Vaccine counseling: Shingles vaccine deferred. Influenza vaccine received today.   Hypothyroidism treated with thyroid  replacement medication  Has Medicare wellness visit in December.   I,Makayla C Reid,acting as a scribe for Ronal JINNY Hailstone, MD.,have documented all relevant documentation on the behalf of Ronal JINNY Hailstone, MD,as  directed by  Ronal JINNY Hailstone, MD while in the presence of Ronal JINNY Hailstone, MD.   I, Ronal JINNY Hailstone, MD, have reviewed all documentation for this visit. The documentation on 11/05/2023 for the exam, diagnosis, procedures, and orders are all accurate and complete.

## 2023-11-05 ENCOUNTER — Ambulatory Visit: Payer: Medicare Other | Admitting: Internal Medicine

## 2023-11-05 ENCOUNTER — Encounter: Payer: Self-pay | Admitting: Internal Medicine

## 2023-11-05 VITALS — BP 140/80 | HR 80 | Ht 65.0 in | Wt 171.0 lb

## 2023-11-05 DIAGNOSIS — M7918 Myalgia, other site: Secondary | ICD-10-CM

## 2023-11-05 DIAGNOSIS — E1122 Type 2 diabetes mellitus with diabetic chronic kidney disease: Secondary | ICD-10-CM | POA: Diagnosis not present

## 2023-11-05 DIAGNOSIS — N189 Chronic kidney disease, unspecified: Secondary | ICD-10-CM | POA: Diagnosis not present

## 2023-11-05 DIAGNOSIS — N182 Chronic kidney disease, stage 2 (mild): Secondary | ICD-10-CM

## 2023-11-05 DIAGNOSIS — E119 Type 2 diabetes mellitus without complications: Secondary | ICD-10-CM

## 2023-11-05 DIAGNOSIS — Z23 Encounter for immunization: Secondary | ICD-10-CM | POA: Diagnosis not present

## 2023-11-05 DIAGNOSIS — I10 Essential (primary) hypertension: Secondary | ICD-10-CM

## 2023-11-05 DIAGNOSIS — E039 Hypothyroidism, unspecified: Secondary | ICD-10-CM

## 2023-11-05 DIAGNOSIS — Z7984 Long term (current) use of oral hypoglycemic drugs: Secondary | ICD-10-CM | POA: Diagnosis not present

## 2023-12-06 ENCOUNTER — Encounter: Payer: Self-pay | Admitting: Internal Medicine

## 2023-12-11 ENCOUNTER — Ambulatory Visit: Payer: Self-pay

## 2023-12-11 NOTE — Telephone Encounter (Signed)
 FYI Only or Action Required?: FYI only for provider: urgent care advised.  Patient was last seen in primary care on 11/05/2023 by Perri Ronal PARAS, MD.  Called Nurse Triage reporting Sore Throat.  Symptoms began yesterday.  Interventions attempted: Nothing.  Symptoms are: unchanged.  Triage Disposition: See Physician Within 24 Hours  Patient/caregiver understands and will follow disposition?: No appointment, going to urgent care     Copied from CRM [id number]. Topic: Clinical - Red Word Triage >> Dec 11, 2023  2:01 PM Mia F wrote: Red Word that prompted transfer to Nurse Triage: Was seen a few weeks ago for an upper respritory infection. She says that she went to bed last night with a sore throat and her glands were swollen. She says her eyes feel week as well. She says her throat is still sore but not as bad as it was last night. She says it seems to be the glands on the side of her neck. No fever, SOB, or chest discomfort.      Reason for Disposition  [1] Pus on tonsils (back of throat) AND [2] fever AND [3] swollen neck lymph nodes (glands)    Unsure about pus but patient does have swollen lymph nodes  Answer Assessment - Initial Assessment Questions 1. ONSET: When did the throat start hurting? (Hours or days ago)      Last night  2. SEVERITY: How bad is the sore throat? (Scale 1-10; mild, moderate or severe)     Mild  3. STREP EXPOSURE: Has there been any exposure to strep within the past week? If Yes, ask: What type of contact occurred?      No 4.  VIRAL SYMPTOMS: Are there any symptoms of a cold, such as a runny nose, cough, hoarse voice or red eyes?      No 5. FEVER: Do you have a fever? If Yes, ask: What is your temperature, how was it measured, and when did it start?     No 6. PUS ON THE TONSILS: Is there pus on the tonsils in the back of your throat?     Has not checked  7. OTHER SYMPTOMS: Do you have any other symptoms? (e.g., difficulty  breathing, headache, rash)     Swollen neck lymph nodes  Protocols used: Sore Throat-A-AH

## 2024-01-02 NOTE — Progress Notes (Signed)
 "  Annual Wellness Visit   Patient Care Team: Alyric Parkin, Ronal PARAS, MD as PCP - General (Internal Medicine) Alvan Ronal BRAVO, MD (Inactive) as PCP - Cardiology (Cardiology) Leslee Reusing, MD as Consulting Physician (Ophthalmology)  Visit Date: 01/14/2024   Chief Complaint  Patient presents with   Annual Exam   Medicare Wellness   Subjective:  Patient: Sharon Peters, Female DOB: [birth year], 83 y.o. MRN: [id number] Vitals:   01/14/24 1058  BP: 130/80   Sharon Peters is a 83 y.o. Female who presents today for her Annual Wellness Visit. Patient has Hypothyroidism; Essential hypertension; ARTHRITIS; ELECTROCARDIOGRAM, ABNORMAL; Diastolic dysfunction; Insomnia; Pericardial effusion; Ejection fraction; Impaired glucose tolerance; Chronic kidney disease, stage II (mild); Primary osteoarthritis of both hands; Right bundle branch block; and Type 2 diabetes mellitus with obesity on their problem list.   She has developed a cough, She previously was experiencing watery eyes and swollen glands. She said her glands have felt much better but she has a bit of a sore throat.   History of Diabetes mellitus, Type II treated with Farxiga 10 mg daily, metformin  500 mg daily. HGBA1c at 6.2%.     History of hypertension treated with amlodipine  10 mg daily. Blood pressure normal today at 130/80.   History of hypothyroidism treated with levothyroxine  75 mcg daily. TSH at 3.45.   History of insomnia treated with zolpidem  5 mg at bedtime as needed.   History of constipation.  Did not like Linzess.  MiraLAX did not work well.   In August 2023, she suffered a fall and was seen in the emergency department and diagnosed with dislocated left shoulder.  Apparently had vertigo which led to her falling.  She was getting up from a chair and felt unsteady and fell into a wall.  CT of the head without contrast was negative and CT of the C-spine was negative.  Was referred to orthopedist.  Denies further falls. She has  completed PT balance training but is still having issues with this. Reports occasional dizziness when standing up from bent position.    History of left knee arthroplasty in May 2023.     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   History of chronic kidney disease which is stable.  Followed at [facility] and was seen there in October 2022.  Has been followed there since 2018.  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  01/07/2024 BUN 15, Creatinine 0.90, eGFR 64.   History of left ventricular diastolic dysfunction.   Had left knee arthroscopic surgery around 1997.  Tubal ligation in 1979.  In 2012, was diagnosed with probable meniscal tear of the left knee injected with steroids.  Has been diagnosed in the past with trochanteric bursitis of the left hip.   Labs 01/07/2024  Blood glucose 107, HgbA1c 6.2%, Otherwise WNL.     10/27/2020 Colonoscopy Perianal skin tags found on perianal exam. The entire examined colon is normal. The patients most recent rectal bleeding is felt most likely to have been due to transient hemorrhoidal irritation. The proximal colon was reinspected. Reinspection of rectum showed no additional findings. No specimens collected. Repeat colonoscopy not recommended.   Health maintenance: Mammogram discussed.  Received eye exam march of 2025 at [facility] ophthalmology    Health Maintenance  Topic Date Due   Medicare Annual Wellness (AWV)  01/07/2024   Zoster Vaccines- Shingrix (1 of 2) 04/14/2024 (Originally 07/18/1959)   OPHTHALMOLOGY EXAM  04/14/2024   HEMOGLOBIN  A1C  07/07/2024   Diabetic kidney evaluation - eGFR measurement  01/06/2025   Diabetic kidney evaluation - Urine ACR  01/06/2025   FOOT EXAM  01/13/2025   DTaP/Tdap/Td (4 - Td or Tdap) 05/13/2033   Pneumococcal Vaccine: 50+ Years  Completed   Influenza Vaccine  Completed   Bone Density Scan  Completed   Meningococcal B Vaccine  Aged Out   COVID-19  Vaccine  Discontinued    Review of Systems  Constitutional:  Negative for fever and malaise/fatigue.  HENT:  Positive for sore throat. Negative for congestion.   Eyes:  Negative for blurred vision.  Respiratory:  Positive for cough. Negative for shortness of breath.   Cardiovascular:  Negative for chest pain, palpitations and leg swelling.  Gastrointestinal:  Negative for vomiting.  Musculoskeletal:  Negative for back pain.  Skin:  Negative for rash.  Neurological:  Negative for loss of consciousness and headaches.   Objective:  Vitals: body mass index is 28.46 kg/m. Today's Vitals   01/14/24 1058  BP: 130/80  Pulse: 80  SpO2: 96%  Weight: 171 lb (77.6 kg)  Height: 5' 5 (1.651 m)   Physical Exam Vitals and nursing note reviewed.  Constitutional:      General: She is not in acute distress.    Appearance: Normal appearance. She is not ill-appearing or toxic-appearing.  HENT:     Head: Normocephalic and atraumatic.     Right Ear: Hearing, tympanic membrane, ear canal and external ear normal.     Left Ear: Hearing, tympanic membrane, ear canal and external ear normal.     Mouth/Throat:     Pharynx: Oropharynx is clear.     Comments: Pharynx slightly injected.  Eyes:     Extraocular Movements: Extraocular movements intact.     Pupils: Pupils are equal, round, and reactive to light.  Neck:     Thyroid : No thyroid  mass, thyromegaly or thyroid  tenderness.     Vascular: No carotid bruit.  Cardiovascular:     Rate and Rhythm: Normal rate and regular rhythm. No extrasystoles are present.    Pulses:          Dorsalis pedis pulses are 2+ on the right side and 2+ on the left side.     Heart sounds: Normal heart sounds, S1 normal and S2 normal. No murmur heard.    No friction rub. No gallop.  Pulmonary:     Effort: Pulmonary effort is normal.     Breath sounds: Normal breath sounds. No decreased breath sounds, wheezing, rhonchi or rales.  Chest:     Chest wall: No mass.   Abdominal:     Palpations: Abdomen is soft. There is no hepatomegaly, splenomegaly or mass.     Tenderness: There is no abdominal tenderness.     Hernia: No hernia is present.  Musculoskeletal:     Cervical back: Normal range of motion.     Right lower leg: No edema.     Left lower leg: No edema.  Lymphadenopathy:     Cervical: No cervical adenopathy.     Upper Body:     Right upper body: No supraclavicular adenopathy.     Left upper body: No supraclavicular adenopathy.  Skin:    General: Skin is warm and dry.  Neurological:     General: No focal deficit present.     Mental Status: She is alert and oriented to person, place, and time. Mental status is at baseline.     Sensory: Sensation is  intact.     Motor: Motor function is intact. No weakness.     Deep Tendon Reflexes: Reflexes are normal and symmetric.  Psychiatric:        Attention and Perception: Attention normal.        Mood and Affect: Mood normal.        Speech: Speech normal.        Behavior: Behavior normal.        Thought Content: Thought content normal.        Cognition and Memory: Cognition normal.        Judgment: Judgment normal.     Current Outpatient Medications  Medication Instructions   acetaminophen  (TYLENOL  8 HOUR) 650 mg, Oral, Every 8 hours PRN   ALPRAZolam  (XANAX ) 0.25 MG tablet One tablet by mouth daily as needed for anxiety   amLODipine  (NORVASC ) 10 MG tablet 1 tablet, Daily   dapagliflozin propanediol (FARXIGA) 10 mg, Daily   diclofenac  Sodium (VOLTAREN ) 1 % GEL APPLY 4 GRAMS TOPICALLY TO THE AFFECTED AREA FOUR TIMES DAILY   Farxiga 5 mg, Daily   hydrocortisone 2.5 % cream APPLY TO THE AFFECTED AREA ON FACE TWICE DAILY FOR UP TO 1 WEEK ON AND 1 WEEK OFF AS NEEDED   levothyroxine  (SYNTHROID ) 75 MCG tablet TAKE 1 TABLET BY MOUTH 30 MINUTES BEFORE BREAKFAST ON AN EMPTY STOMACH   metFORMIN  (GLUCOPHAGE ) 500 MG tablet TAKE 1 TABLET BY MOUTH EVERY DAY WITH BREAKFAST   ONE TOUCH ULTRA TEST test strip  USE DAILY TO TEST GLUCOSE   senna (SENOKOT) 8.6 MG tablet 1 tablet, As needed   traMADol  (ULTRAM ) 50 mg, Oral, Every 12 hours PRN   zolpidem  (AMBIEN ) 5 MG tablet TAKE ONE TABLET BY MOUTH AT BEDTIME AS NEEDED FOR SLEEP   Past Medical History:  Diagnosis Date   Chest discomfort    Nuclear, December, 2008, normal   Diabetes mellitus type II    Ejection fraction    EF 65%, February, 2010, trivial pericardial effusion,   HTN (hypertension)    Hypothyroidism    Obesity    Pericardial effusion    Trivial, echo, February, 2011  /     small...int he past...unexplained...normal sedimentation rate and ANA   Psoriasis    Squamous cell carcinoma, leg, 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   Medical/Surgical History Narrative:  Allergic/Intolerant to: Allergies[1]  Past Surgical History:  Procedure Laterality Date   left arthroscopic knee surgery     REPLACEMENT TOTAL KNEE Left 05/25/2021   squamous cell carcinoma remo     05/2022   TUBAL LIGATION     Family History  Problem Relation Age of Onset   Cancer Mother    Cancer Father    Breast cancer Brother    Diabetes Other        2 siblings   Cancer Other    Social History   Social History Narrative   Not on file   Most Recent Health Risks Assessment:   Most Recent Social Determinants of Health (Including Hx of Tobacco, Alcohol, and Drug Use) SDOH Screenings   Food Insecurity: No Food Insecurity (01/07/2023)  Housing: Low Risk (01/07/2023)  Transportation Needs: No Transportation Needs (01/07/2023)  Utilities: Not At Risk (01/07/2023)  Alcohol Screen: Low Risk (01/14/2024)  Depression (PHQ2-9): Low Risk (09/27/2023)  Financial Resource Strain: Low Risk (01/07/2023)  Physical Activity: Insufficiently Active (01/07/2023)  Social Connections: Moderately Integrated (01/07/2023)  Stress: No Stress Concern Present (01/07/2023)  Tobacco Use: Low Risk (01/14/2024)  Health Literacy: Adequate Health Literacy (  01/07/2023)   Social History[2] Most Recent  Functional Status Assessment:  Most Recent Fall Risk Assessment:    09/27/2023    1:10 PM  Fall Risk   Falls in the past year? 0  Number falls in past yr: 0  Injury with Fall? 0      Data saved with a previous flowsheet row definition   Most Recent Anxiety/Depression Screenings:    09/27/2023    1:10 PM 06/27/2023    2:37 PM  PHQ 2/9 Scores  PHQ - 2 Score 0 0      05/14/2023   10:29 AM  GAD 7 : Generalized Anxiety Score  Nervous, Anxious, on Edge 0  Control/stop worrying 0  Worry too much - different things 0  Trouble relaxing 0  Restless 0  Easily annoyed or irritable 0  Afraid - awful might happen 0  Total GAD 7 Score 0  Anxiety Difficulty Not difficult at all   Most Recent Cognitive Screening:    01/07/2023   11:07 AM  6CIT Screen  What Year? 0 points  What month? 0 points  What time? 0 points  Count back from 20 0 points  Months in reverse 0 points  Repeat phrase 0 points  Total Score 0 points   Most Recent Vision/Hearing Screenings:No results found. Results:  Studies Obtained And Personally Reviewed By Me: Diabetic Foot Exam - Simple   Simple Foot Form Visual Inspection No deformities, no ulcerations, no other skin breakdown bilaterally: Yes Sensation Testing Pulse Check Posterior Tibialis and Dorsalis pulse intact bilaterally: Yes Comments     10/27/2020 Colonoscopy Perianal skin tags found on perianal exam. The entire examined colon is normal. The patients most recent rectal bleeding is felt most likely to have been due to transient hemorrhoidal irritation. The proximal colon was reinspected. Reinspection of rectum showed no additional findings. No specimens collected. Repeat colonoscopy not recommended.   Labs:  CBC w/ Differential Lab Results  Component Value Date   WBC 5.1 01/07/2024   RBC 4.81 01/07/2024   HGB 13.7 01/07/2024   HCT 41.9 01/07/2024   PLT 241 01/07/2024   MCV 87.1 01/07/2024   MCH 28.5 01/07/2024   MCHC 32.7 01/07/2024    RDW 12.6 01/07/2024   MPV 10.9 01/07/2024   LYMPHSABS 1,242 12/26/2021   MONOABS 506 11/25/2015   BASOSABS 31 01/07/2024    Comprehensive Metabolic Panel Lab Results  Component Value Date   NA 135 01/07/2024   K 4.3 01/07/2024   CL 101 01/07/2024   CO2 27 01/07/2024   GLUCOSE 107 (H) 01/07/2024   BUN 15 01/07/2024   CREATININE 0.90 01/07/2024   CALCIUM 9.6 01/07/2024   PROT 7.3 01/07/2024   ALBUMIN 4.5 11/25/2015   AST 18 01/07/2024   ALT 15 01/07/2024   ALKPHOS 51 11/25/2015   BILITOT 0.7 01/07/2024   EGFR 64 01/07/2024   GFRNONAA 52 (L) 06/28/2020   Lipid Panel  Lab Results  Component Value Date   CHOL 182 01/07/2024   HDL 64 01/07/2024   LDLCALC 97 01/07/2024   TRIG 113 01/07/2024   A1c Lab Results  Component Value Date   HGBA1C 6.2 (H) 01/07/2024    TSH Lab Results  Component Value Date   TSH 3.45 01/07/2024    Assessment & Plan:   Orders Placed This Encounter  Procedures   MM 3D DIAGNOSTIC MAMMOGRAM BILATERAL BREAST    Reason for Exam (SYMPTOM  OR DIAGNOSIS REQUIRED):   health maintenance    Preferred  imaging location?:   [facility]   POCT URINALYSIS DIP (CLINITEK)   She has developed a cough, She previously was experiencing watery eyes and swollen glands. She said her glands have felt much better but she has a bit of a sore throat.   Diabetes mellitus, Type II: treated with Farxiga 10 mg daily, metformin  500 mg daily. HGBA1c at 6.2%.     Hypertension: treated with amlodipine  10 mg daily. Blood pressure normal today at 130/80.   Hypothyroidism: treated with levothyroxine  75 mcg daily. TSH at 3.45.   Insomnia: treated with zolpidem  5 mg at bedtime as needed.   Chronic kidney disease: which is stable.  Followed at [facility] and was seen there in October 2022.  Has been followed there since 2018.  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  01/07/2024 BUN 15, Creatinine 0.90, eGFR 64.    10/27/2020 Colonoscopy Perianal  skin tags found on perianal exam. The entire examined colon is normal. The patients most recent rectal bleeding is felt most likely to have been due to transient hemorrhoidal irritation. The proximal colon was reinspected. Reinspection of rectum showed no additional findings. No specimens collected. Repeat colonoscopy not recommended.   Health maintenance: Mammogram discussed.  Received eye exam march of 2025 at [facility].   Mammogram ordered.      Annual Wellness Visit done today including the all of the following: Reviewed patient's Family Medical History Reviewed patient's SDOH and reviewed tobacco, alcohol, and drug use.  Reviewed and updated list of patient's medical providers Assessment of cognitive impairment was done Assessed patient's functional ability Established a written schedule for health screening services Health Risk Assessent Completed and Reviewed  Discussed health benefits of physical activity, and encouraged her to engage in regular exercise appropriate for her age and condition.    I,Makayla C Reid,acting as a scribe for Ronal JINNY Hailstone, MD.,have documented all relevant documentation on the behalf of Ronal JINNY Hailstone, MD,as directed by  Ronal JINNY Hailstone, MD while in the presence of Ronal JINNY Hailstone, MD.  I, Ronal JINNY Hailstone, MD, have reviewed all documentation for and agree with the above Annual Wellness Visit documentation.  Ronal JINNY Hailstone, MD Internal Medicine 01/14/2024     [1]  Allergies Allergen Reactions   Celecoxib Swelling    Eyes swell shut  [2]  Social History Tobacco Use   Smoking status: Never   Smokeless tobacco: Never  Vaping Use   Vaping status: Never Used  Substance Use Topics   Alcohol use: Yes    Comment: rarely   "

## 2024-01-06 ENCOUNTER — Ambulatory Visit: Payer: Self-pay

## 2024-01-06 ENCOUNTER — Telehealth: Payer: Self-pay | Admitting: Internal Medicine

## 2024-01-06 NOTE — Telephone Encounter (Signed)
 FYI Only or Action Required?: Action required by provider: clinical question for provider, update on patient condition, and requesting medication and wanting a call back from the office.  Patient was last seen in primary care on 11/05/2023 by Perri Ronal PARAS, MD.  Called Nurse Triage reporting Eye Problem.  Symptoms began several days ago.  Interventions attempted: Rest, hydration, or home remedies.  Symptoms are: unchanged.  Triage Disposition: See HCP Within 4 Hours (Or PCP Triage)  Patient/caregiver understands and will follow disposition?: No, wishes to speak with PCP  Message from Shanda MATSU sent at 01/06/2024 12:29 PM EST  Reason for Triage: Patient reporting swollen glands, aching eyes, neck sore.    Reason for Disposition  MODERATE eye pain or discomfort (e.g., interferes with normal activities or awakens from sleep; more than mild)  Answer Assessment - Initial Assessment Questions Reports burning sensation to her eyes, swollen glands and neck soreness. Patient is asking for antibiotics to be sent in for her. Requesting a call back from the office.   1. ONSET: When did the pain start? (e.g., minutes, hours, days)     Started around Thanksgiving 2. TIMING: Does the pain come and go, or has it been constant since it started? (e.g., constant, intermittent, fleeting)     Constant 3. SEVERITY: How bad is the pain?  (Scale 1-10; mild, moderate or severe)     mild 4. LOCATION: Where does it hurt?  (e.g., eyelid, eye, cheekbone)     Bilateral eyes-burning 5. CAUSE: What do you think is causing the pain?     unsure 6. VISION: Do you have blurred vision or changes in your vision?      no 7. EYE DISCHARGE: Is there any discharge (pus) from the eye(s)?  If Yes, ask: What color is it?      Clear drainage 8. FEVER: Do you have a fever? If Yes, ask: What is it, how was it measured, and when did it start?      no 9. OTHER SYMPTOMS: Do you have any other  symptoms? (e.g., headache, nasal discharge, facial rash)     Headache, swollen glands, neck soreness  Protocols used: Eye Pain and Other Symptoms-A-AH

## 2024-01-07 ENCOUNTER — Other Ambulatory Visit: Payer: Medicare Other

## 2024-01-07 ENCOUNTER — Ambulatory Visit: Admitting: Internal Medicine

## 2024-01-07 NOTE — Telephone Encounter (Signed)
 Done

## 2024-01-08 ENCOUNTER — Ambulatory Visit: Payer: Self-pay | Admitting: Internal Medicine

## 2024-01-08 LAB — COMPREHENSIVE METABOLIC PANEL WITH GFR
AG Ratio: 1.7 (calc) (ref 1.0–2.5)
ALT: 15 U/L (ref 6–29)
AST: 18 U/L (ref 10–35)
Albumin: 4.6 g/dL (ref 3.6–5.1)
Alkaline phosphatase (APISO): 65 U/L (ref 37–153)
BUN: 15 mg/dL (ref 7–25)
CO2: 27 mmol/L (ref 20–32)
Calcium: 9.6 mg/dL (ref 8.6–10.4)
Chloride: 101 mmol/L (ref 98–110)
Creat: 0.9 mg/dL (ref 0.60–0.95)
Globulin: 2.7 g/dL (ref 1.9–3.7)
Glucose, Bld: 107 mg/dL — ABNORMAL HIGH (ref 65–99)
Potassium: 4.3 mmol/L (ref 3.5–5.3)
Sodium: 135 mmol/L (ref 135–146)
Total Bilirubin: 0.7 mg/dL (ref 0.2–1.2)
Total Protein: 7.3 g/dL (ref 6.1–8.1)
eGFR: 64 mL/min/1.73m2

## 2024-01-08 LAB — CBC WITH DIFFERENTIAL/PLATELET
Absolute Lymphocytes: 1352 {cells}/uL (ref 850–3900)
Absolute Monocytes: 663 {cells}/uL (ref 200–950)
Basophils Absolute: 31 {cells}/uL (ref 0–200)
Basophils Relative: 0.6 %
Eosinophils Absolute: 71 {cells}/uL (ref 15–500)
Eosinophils Relative: 1.4 %
HCT: 41.9 % (ref 35.9–46.0)
Hemoglobin: 13.7 g/dL (ref 11.7–15.5)
MCH: 28.5 pg (ref 27.0–33.0)
MCHC: 32.7 g/dL (ref 31.6–35.4)
MCV: 87.1 fL (ref 81.4–101.7)
MPV: 10.9 fL (ref 7.5–12.5)
Monocytes Relative: 13 %
Neutro Abs: 2984 {cells}/uL (ref 1500–7800)
Neutrophils Relative %: 58.5 %
Platelets: 241 Thousand/uL (ref 140–400)
RBC: 4.81 Million/uL (ref 3.80–5.10)
RDW: 12.6 % (ref 11.0–15.0)
Total Lymphocyte: 26.5 %
WBC: 5.1 Thousand/uL (ref 3.8–10.8)

## 2024-01-08 LAB — HEMOGLOBIN A1C
Hgb A1c MFr Bld: 6.2 % — ABNORMAL HIGH
Mean Plasma Glucose: 131 mg/dL
eAG (mmol/L): 7.3 mmol/L

## 2024-01-08 LAB — LIPID PANEL
Cholesterol: 182 mg/dL
HDL: 64 mg/dL
LDL Cholesterol (Calc): 97 mg/dL
Non-HDL Cholesterol (Calc): 118 mg/dL
Total CHOL/HDL Ratio: 2.8 (calc)
Triglycerides: 113 mg/dL

## 2024-01-08 LAB — MICROALBUMIN / CREATININE URINE RATIO
Creatinine, Urine: 57 mg/dL (ref 20–275)
Microalb Creat Ratio: 7 mg/g{creat}
Microalb, Ur: 0.4 mg/dL

## 2024-01-08 LAB — TSH: TSH: 3.45 m[IU]/L (ref 0.40–4.50)

## 2024-01-10 ENCOUNTER — Encounter: Payer: Medicare Other | Admitting: Internal Medicine

## 2024-01-14 ENCOUNTER — Other Ambulatory Visit: Payer: Self-pay | Admitting: Internal Medicine

## 2024-01-14 ENCOUNTER — Ambulatory Visit: Payer: Medicare Other | Admitting: Internal Medicine

## 2024-01-14 ENCOUNTER — Encounter: Payer: Self-pay | Admitting: Internal Medicine

## 2024-01-14 VITALS — BP 130/80 | HR 80 | Ht 65.0 in | Wt 171.0 lb

## 2024-01-14 DIAGNOSIS — Z96652 Presence of left artificial knee joint: Secondary | ICD-10-CM

## 2024-01-14 DIAGNOSIS — G8929 Other chronic pain: Secondary | ICD-10-CM

## 2024-01-14 DIAGNOSIS — F5104 Psychophysiologic insomnia: Secondary | ICD-10-CM

## 2024-01-14 DIAGNOSIS — E119 Type 2 diabetes mellitus without complications: Secondary | ICD-10-CM

## 2024-01-14 DIAGNOSIS — E7439 Other disorders of intestinal carbohydrate absorption: Secondary | ICD-10-CM

## 2024-01-14 DIAGNOSIS — Z0000 Encounter for general adult medical examination without abnormal findings: Secondary | ICD-10-CM

## 2024-01-14 DIAGNOSIS — N182 Chronic kidney disease, stage 2 (mild): Secondary | ICD-10-CM | POA: Diagnosis not present

## 2024-01-14 DIAGNOSIS — E039 Hypothyroidism, unspecified: Secondary | ICD-10-CM

## 2024-01-14 DIAGNOSIS — J22 Unspecified acute lower respiratory infection: Secondary | ICD-10-CM

## 2024-01-14 DIAGNOSIS — M19041 Primary osteoarthritis, right hand: Secondary | ICD-10-CM

## 2024-01-14 DIAGNOSIS — G4700 Insomnia, unspecified: Secondary | ICD-10-CM | POA: Diagnosis not present

## 2024-01-14 DIAGNOSIS — I10 Essential (primary) hypertension: Secondary | ICD-10-CM | POA: Diagnosis not present

## 2024-01-14 DIAGNOSIS — Z7984 Long term (current) use of oral hypoglycemic drugs: Secondary | ICD-10-CM | POA: Diagnosis not present

## 2024-01-14 DIAGNOSIS — Z1231 Encounter for screening mammogram for malignant neoplasm of breast: Secondary | ICD-10-CM

## 2024-01-14 LAB — POCT URINALYSIS DIP (CLINITEK)
Bilirubin, UA: NEGATIVE
Blood, UA: NEGATIVE
Glucose, UA: 250 mg/dL — AB
Ketones, POC UA: NEGATIVE mg/dL
Leukocytes, UA: NEGATIVE
Nitrite, UA: NEGATIVE
POC PROTEIN,UA: NEGATIVE
Spec Grav, UA: 1.01
Urobilinogen, UA: 0.2 U/dL
pH, UA: 6.5

## 2024-01-14 NOTE — Patient Instructions (Signed)
 Sharon Peters,  Thank you for taking the time for your Medicare Wellness Visit. I appreciate your continued commitment to your health goals. Please review the care plan we discussed, and feel free to reach out if I can assist you further.  Please note that Annual Wellness Visits do not include a physical exam. Some assessments may be limited, especially if the visit was conducted virtually. If needed, we may recommend an in-person follow-up with your provider.  Ongoing Care Seeing your primary care provider every 3 to 6 months helps us  monitor your health and provide consistent, personalized care.   Referrals If a referral was made during today's visit and you haven't received any updates within two weeks, please contact the referred provider directly to check on the status.  Recommended Screenings:  Health Maintenance  Topic Date Due   Medicare Annual Wellness Visit  01/07/2024   Zoster (Shingles) Vaccine (1 of 2) 04/14/2024*   Eye exam for diabetics  04/14/2024   Hemoglobin A1C  07/07/2024   Yearly kidney function blood test for diabetes  01/06/2025   Yearly kidney health urinalysis for diabetes  01/06/2025   Complete foot exam   01/13/2025   DTaP/Tdap/Td vaccine (4 - Td or Tdap) 05/13/2033   Pneumococcal Vaccine for age over 64  Completed   Flu Shot  Completed   Osteoporosis screening with Bone Density Scan  Completed   Meningitis B Vaccine  Aged Out   COVID-19 Vaccine  Discontinued  *Topic was postponed. The date shown is not the original due date.       01/14/2024   11:42 AM  Advanced Directives  Does Patient Have a Medical Advance Directive? No  Would patient like information on creating a medical advance directive? No - Patient declined    Vision: Annual vision screenings are recommended for early detection of glaucoma, cataracts, and diabetic retinopathy. These exams can also reveal signs of chronic conditions such as diabetes and high blood pressure.  Dental: Annual  dental screenings help detect early signs of oral cancer, gum disease, and other conditions linked to overall health, including heart disease and diabetes.  Please see the attached documents for additional preventive care recommendations.

## 2024-01-14 NOTE — Progress Notes (Unsigned)
 "  Chief Complaint  Patient presents with   Annual Exam   Medicare Wellness     Subjective:   Sharon Peters is a 83 y.o. female who presents for a Medicare Annual Wellness Visit.  Visit info / Clinical Intake: Medicare Wellness Visit Type:: Subsequent Annual Wellness Visit Persons participating in visit and providing information:: patient Medicare Wellness Visit Mode:: In-person (required for WTM) Interpreter Needed?: No Pre-visit prep was completed: yes AWV questionnaire completed by patient prior to visit?: no Living arrangements:: (!) lives alone Patient's Overall Health Status Rating: very good Typical amount of pain: some Does pain affect daily life?: no Are you currently prescribed opioids?: no  Dietary Habits and Nutritional Risks How many meals a day?: 3 Eats fruit and vegetables daily?: yes Most meals are obtained by: preparing own meals In the last 2 weeks, have you had any of the following?: none Diabetic:: (!) yes Any non-healing wounds?: no How often do you check your BS?: as needed Would you like to be referred to a Nutritionist or for Diabetic Management? : no  Functional Status Activities of Daily Living (to include ambulation/medication): Independent Ambulation: Independent Medication Administration: Independent Home Management (perform basic housework or laundry): Independent Manage your own finances?: yes Primary transportation is: driving Concerns about vision?: no *vision screening is required for WTM* Concerns about hearing?: no  Fall Screening Falls in the past year?: 0 Number of falls in past year: 0 Was there an injury with Fall?: 0 Fall Risk Category Calculator: 0 Patient Fall Risk Level: Low Fall Risk  Fall Risk Patient at Risk for Falls Due to: No Fall Risks Fall risk Follow up: Falls prevention discussed; Education provided; Falls evaluation completed  Home and Transportation Safety: All rugs have non-skid backing?: yes All  stairs or steps have railings?: N/A, no stairs Grab bars in the bathtub or shower?: yes Have non-skid surface in bathtub or shower?: yes Good home lighting?: yes Regular seat belt use?: yes Hospital stays in the last year:: no  Cognitive Assessment Difficulty concentrating, remembering, or making decisions? : no Will 6CIT or Mini Cog be Completed: no 6CIT or Mini Cog Declined: patient alert, oriented, able to answer questions appropriately and recall recent events  Advance Directives (For Healthcare) Does Patient Have a Medical Advance Directive?: No Would patient like information on creating a medical advance directive?: No - Patient declined  Reviewed/Updated  Reviewed/Updated: Reviewed All (Medical, Surgical, Family, Medications, Allergies, Care Teams, Patient Goals)    Allergies (verified) Celecoxib   Current Medications (verified) Outpatient Encounter Medications as of 01/14/2024  Medication Sig   acetaminophen  (TYLENOL  8 HOUR) 650 MG CR tablet Take 1 tablet (650 mg total) by mouth every 8 (eight) hours as needed for pain or fever.   ALPRAZolam  (XANAX ) 0.25 MG tablet One tablet by mouth daily as needed for anxiety   amLODipine  (NORVASC ) 10 MG tablet Take 1 tablet by mouth daily.   dapagliflozin propanediol (FARXIGA) 10 MG TABS tablet Take 10 mg by mouth daily.   diclofenac  Sodium (VOLTAREN ) 1 % GEL APPLY 4 GRAMS TOPICALLY TO THE AFFECTED AREA FOUR TIMES DAILY   hydrocortisone 2.5 % cream APPLY TO THE AFFECTED AREA ON FACE TWICE DAILY FOR UP TO 1 WEEK ON AND 1 WEEK OFF AS NEEDED   levothyroxine  (SYNTHROID ) 75 MCG tablet TAKE 1 TABLET BY MOUTH 30 MINUTES BEFORE BREAKFAST ON AN EMPTY STOMACH   metFORMIN  (GLUCOPHAGE ) 500 MG tablet TAKE 1 TABLET BY MOUTH EVERY DAY WITH BREAKFAST   ONE  TOUCH ULTRA TEST test strip USE DAILY TO TEST GLUCOSE   senna (SENOKOT) 8.6 MG tablet Take 1 tablet by mouth as needed for constipation.   traMADol  (ULTRAM ) 50 MG tablet Take 1 tablet (50 mg total)  by mouth every 12 (twelve) hours as needed.   zolpidem  (AMBIEN ) 5 MG tablet TAKE ONE TABLET BY MOUTH AT BEDTIME AS NEEDED FOR SLEEP   [DISCONTINUED] FARXIGA 5 MG TABS tablet Take 5 mg by mouth daily.   No facility-administered encounter medications on file as of 01/14/2024.    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   squamous cell carcinoma remo     05/2022   TUBAL LIGATION     Family History  Problem Relation Age of Onset   Cancer Mother    Cancer Father    Breast cancer Brother    Diabetes Other        2 siblings   Cancer Other    Social History   Occupational History   Occupation: retired  Tobacco Use   Smoking status: Never   Smokeless tobacco: Never  Vaping Use   Vaping status: Never Used  Substance and Sexual Activity   Alcohol use: Yes    Comment: rarely   Drug use: Not on file   Sexual activity: Not on file   Tobacco Counseling Counseling given: No  SDOH Screenings   Food Insecurity: No Food Insecurity (01/14/2024)  Housing: Low Risk (01/14/2024)  Transportation Needs: No Transportation Needs (01/14/2024)  Utilities: Not At Risk (01/14/2024)  Alcohol Screen: Low Risk (01/14/2024)  Depression (PHQ2-9): Low Risk (01/14/2024)  Financial Resource Strain: Low Risk (01/14/2024)  Physical Activity: Sufficiently Active (01/14/2024)  Social Connections: Socially Isolated (01/14/2024)  Stress: No Stress Concern Present  (01/14/2024)  Tobacco Use: Low Risk (01/14/2024)  Health Literacy: Adequate Health Literacy (01/14/2024)   See flowsheets for full screening details  Depression Screen PHQ 2 & 9 Depression Scale- Over the past 2 weeks, how often have you been bothered by any of the following problems? Little interest or pleasure in doing things: 0 Feeling down, depressed, or hopeless (PHQ Adolescent also includes...irritable): 0 PHQ-2 Total Score: 0 Trouble falling or staying asleep, or sleeping too much: 0 Feeling tired or having little energy: 0 Poor appetite or overeating (PHQ Adolescent also includes...weight loss): 0 Feeling bad about yourself - or that you are a failure or have let yourself or your family down: 0 Trouble concentrating on things, such as reading the newspaper or watching television (PHQ Adolescent also includes...like school work): 0 Moving or speaking so slowly that other people could have noticed. Or the opposite - being so fidgety or restless that you have been moving around a lot more than usual: 0 Thoughts that you would be better off dead, or of hurting yourself in some way: 0 PHQ-9 Total Score: 0 If you checked off any problems, how difficult  have these problems made it for you to do your work, take care of things at home, or get along with other people?: Not difficult at all     Goals Addressed               This Visit's Progress     Exercise 3x per week (30 min per time) (pt-stated)               Objective:    Today's Vitals   01/14/24 1058  BP: 130/80  Pulse: 80  SpO2: 96%  Weight: 171 lb (77.6 kg)  Height: 5' 5 (1.651 m)   Body mass index is 28.46 kg/m.  Hearing/Vision screen No results found. Immunizations and Health Maintenance Health Maintenance  Topic Date Due   Zoster Vaccines- Shingrix (1 of 2) 04/14/2024 (Originally 07/18/1959)   OPHTHALMOLOGY EXAM  04/14/2024   HEMOGLOBIN A1C  07/07/2024   Diabetic kidney evaluation - eGFR measurement   01/06/2025   Diabetic kidney evaluation - Urine ACR  01/06/2025   FOOT EXAM  01/13/2025   Medicare Annual Wellness (AWV)  01/13/2025   DTaP/Tdap/Td (4 - Td or Tdap) 05/13/2033   Pneumococcal Vaccine: 50+ Years  Completed   Influenza Vaccine  Completed   Bone Density Scan  Completed   Meningococcal B Vaccine  Aged Out   COVID-19 Vaccine  Discontinued        Assessment/Plan:  This is a routine wellness examination for Sharon Peters.  Patient Care Team: Perri Ronal PARAS, MD as PCP - General (Internal Medicine) Alvan Ronal BRAVO, MD (Inactive) as PCP - Cardiology (Cardiology) Leslee Reusing, MD as Consulting Physician (Ophthalmology)  I have personally reviewed and noted the following in the patients chart:   Medical and social history Use of alcohol, tobacco or illicit drugs  Current medications and supplements including opioid prescriptions. Functional ability and status Nutritional status Physical activity Advanced directives List of other physicians Hospitalizations, surgeries, and ER visits in previous 12 months Vitals Screenings to include cognitive, depression, and falls Referrals and appointments  Orders Placed This Encounter  Procedures   MM 3D SCREENING MAMMOGRAM BILATERAL BREAST    Reason for Exam (SYMPTOM  OR DIAGNOSIS REQUIRED):   health maintenance    Preferred imaging location?:   [facility]   POCT URINALYSIS DIP (CLINITEK)   In addition, I have reviewed and discussed with patient certain preventive protocols, quality metrics, and best practice recommendations. A written personalized care plan for preventive services as well as general preventive health recommendations were provided to patient.   Sharon Peters, CMA   01/14/2024   Return in 1 year (on 01/13/2025).  After Visit Summary: (In Person-Printed) AVS printed and given to the patient  Nurse Notes: none "

## 2024-01-15 ENCOUNTER — Encounter: Payer: Self-pay | Admitting: Internal Medicine

## 2024-03-27 ENCOUNTER — Ambulatory Visit: Admitting: Physical Medicine & Rehabilitation

## 2024-03-30 ENCOUNTER — Encounter: Admitting: Physical Medicine & Rehabilitation

## 2025-01-19 ENCOUNTER — Other Ambulatory Visit

## 2025-01-21 ENCOUNTER — Ambulatory Visit: Admitting: Internal Medicine
